# Patient Record
Sex: Female | Born: 1945 | Race: White | Hispanic: No | Smoking: Former smoker
Health system: Southern US, Community
[De-identification: ages and names within clinical notes are randomized; demographics above are authoritative.]

## PROBLEM LIST (undated history)

## (undated) DIAGNOSIS — E785 Hyperlipidemia, unspecified: Secondary | ICD-10-CM

## (undated) DIAGNOSIS — J189 Pneumonia, unspecified organism: Secondary | ICD-10-CM

## (undated) DIAGNOSIS — I10 Essential (primary) hypertension: Secondary | ICD-10-CM

## (undated) DIAGNOSIS — R7303 Prediabetes: Secondary | ICD-10-CM

## (undated) DIAGNOSIS — Z8619 Personal history of other infectious and parasitic diseases: Secondary | ICD-10-CM

## (undated) DIAGNOSIS — M810 Age-related osteoporosis without current pathological fracture: Secondary | ICD-10-CM

## (undated) DIAGNOSIS — E559 Vitamin D deficiency, unspecified: Secondary | ICD-10-CM

## (undated) DIAGNOSIS — C801 Malignant (primary) neoplasm, unspecified: Secondary | ICD-10-CM

## (undated) DIAGNOSIS — Z780 Asymptomatic menopausal state: Secondary | ICD-10-CM

## (undated) HISTORY — DX: Asymptomatic menopausal state: Z78.0

## (undated) HISTORY — DX: Hyperlipidemia, unspecified: E78.5

## (undated) HISTORY — DX: Personal history of other infectious and parasitic diseases: Z86.19

## (undated) HISTORY — DX: Age-related osteoporosis without current pathological fracture: M81.0

## (undated) HISTORY — DX: Vitamin D deficiency, unspecified: E55.9

## (undated) HISTORY — DX: Prediabetes: R73.03

---

## 2009-10-28 LAB — BASIC METABOLIC PANEL: Creatinine: 0.8 mg/dL (ref <=1.1)

## 2009-10-28 LAB — LIPID PANEL
LDL Cholesterol: 124 mg/dL
Triglycerides: 220 mg/dL — AB (ref 40–160)

## 2011-02-23 LAB — CBC AND DIFFERENTIAL
Platelets: 136 10*3/uL — AB (ref 150–399)
WBC: 10.5 10^3/mL

## 2011-10-15 ENCOUNTER — Emergency Department (INDEPENDENT_AMBULATORY_CARE_PROVIDER_SITE_OTHER)
Admission: EM | Admit: 2011-10-15 | Discharge: 2011-10-15 | Disposition: A | Discharge status: Home / Self Care | Payer: Medicare Other | Source: Home / Self Care

## 2011-10-15 ENCOUNTER — Encounter: Payer: Self-pay | Admitting: *Deleted

## 2011-10-15 DIAGNOSIS — N390 Urinary tract infection, site not specified: Secondary | ICD-10-CM

## 2011-10-15 DIAGNOSIS — R1900 Intra-abdominal and pelvic swelling, mass and lump, unspecified site: Secondary | ICD-10-CM

## 2011-10-15 HISTORY — DX: Essential (primary) hypertension: I10

## 2011-10-15 LAB — POCT URINALYSIS DIP (MANUAL ENTRY)
Glucose, UA: NEGATIVE
Nitrite, UA: POSITIVE
Urobilinogen, UA: 0.2 (ref 0–1)

## 2011-10-15 MED ORDER — CEPHALEXIN 500 MG PO CAPS: 500.0000 mg | ORAL_CAPSULE | Freq: Three times a day (TID) | ORAL | Status: AC

## 2011-10-15 NOTE — ED Notes (Signed)
Pt c/o dysuria, frequency and urgency x 1 wk. Denies fever. No OTC meds.

## 2011-10-15 NOTE — ED Provider Notes (Signed)
History     CSN: [id number]  Arrival date & time 10/15/11  4098   First MD Initiated Contact with Patient 10/15/11 [phone]      Chief Complaint  Patient presents with  . Dysuria   HPI DYSURIA Onset:  1 week Description: dysuria, increased urinary frequency Modifying factors: none  Symptoms Urgency:  mild Frequency: mild  Hesitancy:  no Hematuria:  no Flank Pain:  no Fever: no Nausea/Vomiting:  no Missed LMP: no STD exposure: no Discharge: no Irritants: no Rash: no Abdominal Pain: none    Red Flags   More than 3 UTI's last 12 months:  no PMH of  Diabetes or Immunosuppression:  no Renal Disease/Calculi: no Urinary Tract Abnormality:  no Instrumentation or Trauma: no    Past Medical History  Diagnosis Date  . Hypertension     History reviewed. No pertinent past surgical history.  History reviewed. No pertinent family history.  History  Substance Use Topics  . Smoking status: Current Everyday Smoker -- 0.5 packs/day  . Smokeless tobacco: Not on file  . Alcohol Use: No    OB History    Grav Para Term Preterm Abortions TAB SAB Ect Mult Living                  Review of Systems  All other systems reviewed and are negative.    Allergies  Aspirin  Home Medications   Current Outpatient Rx  Name Route Sig Dispense Refill  . LISINOPRIL 10 MG PO TABS Oral Take 10 mg by mouth daily.      BP 120/82  Pulse 96  Temp 98 F (36.7 C) (Oral)  Resp 16  Ht 5\' 9"  (1.753 m)  Wt 153 lb 8 oz (69.627 kg)  BMI 22.67 kg/m2  SpO2 100%  Physical Exam  Constitutional: She appears well-developed and well-nourished.  HENT:  Head: Normocephalic and atraumatic.  Eyes: Conjunctivae are normal. Pupils are equal, round, and reactive to light.  Neck: Normal range of motion. Neck supple.  Cardiovascular: Normal rate and regular rhythm.   Pulmonary/Chest: Effort normal and breath sounds normal.  Abdominal: Soft.       ? Pulsatile abd mass in mid abdomen  Mild  suprapubic tenderness    Musculoskeletal: Normal range of motion.  Neurological: She is alert.  Skin: Skin is warm.    ED Course  Procedures (including critical care time)   Labs Reviewed  POCT URINALYSIS DIP (MANUAL ENTRY)  URINE CULTURE   No results found.   1. UTI (lower urinary tract infection)   2. Pulsatile abdominal mass       MDM  1-  Will treat with keflex x 7 days. Will culture urine. Discussed infectious red flags for reevaluation.   2- Higher concern for this being AAA given age, smoking status, and baseline HTN.  Currently asymptomatic.  Will obtain abdominal ultrasound to check for this.  No signs/symptoms of vascular compromise.  Follow up pending imaging.          Doree Albee, MD 10/15/11 1048

## 2011-10-15 NOTE — ED Notes (Signed)
Linda with [facility] sch'ed appt for complete abd u/s 10/16/11 @ 0930 at [facility]. Pt notified of the appt.

## 2011-10-16 ENCOUNTER — Ambulatory Visit (HOSPITAL_BASED_OUTPATIENT_CLINIC_OR_DEPARTMENT_OTHER)
Admit: 2011-10-16 | Discharge: 2011-10-16 | Disposition: A | Discharge status: Home / Self Care | Payer: Medicare Other | Attending: Family Medicine | Admitting: Family Medicine

## 2011-10-16 DIAGNOSIS — R10819 Abdominal tenderness, unspecified site: Secondary | ICD-10-CM | POA: Diagnosis not present

## 2011-10-16 DIAGNOSIS — I10 Essential (primary) hypertension: Secondary | ICD-10-CM | POA: Insufficient documentation

## 2011-10-16 DIAGNOSIS — Z87891 Personal history of nicotine dependence: Secondary | ICD-10-CM | POA: Diagnosis not present

## 2011-10-17 LAB — URINE CULTURE: Colony Count: >=100000

## 2011-10-18 ENCOUNTER — Telehealth: Payer: Self-pay | Admitting: Emergency Medicine

## 2011-10-18 NOTE — ED Provider Notes (Signed)
Agree with exam, assessment, and plan.   Lattie Haw, MD 10/18/11 1430

## 2011-12-11 ENCOUNTER — Encounter: Payer: Self-pay | Admitting: Family Medicine

## 2011-12-11 ENCOUNTER — Ambulatory Visit (INDEPENDENT_AMBULATORY_CARE_PROVIDER_SITE_OTHER): Payer: Medicare Other | Admitting: Family Medicine

## 2011-12-11 VITALS — BP 124/84 | HR 80 | Ht 68.5 in | Wt 155.0 lb

## 2011-12-11 DIAGNOSIS — Z720 Tobacco use: Secondary | ICD-10-CM

## 2011-12-11 DIAGNOSIS — I10 Essential (primary) hypertension: Secondary | ICD-10-CM | POA: Diagnosis not present

## 2011-12-11 DIAGNOSIS — F17200 Nicotine dependence, unspecified, uncomplicated: Secondary | ICD-10-CM

## 2011-12-11 MED ORDER — LISINOPRIL 10 MG PO TABS: 10.0000 mg | ORAL_TABLET | Freq: Every day | ORAL | Status: DC

## 2011-12-11 NOTE — Progress Notes (Signed)
CC: Jennifer [name] is a 66 y.o. female is here for Establish Care and Hypertension   Subjective: HPI:  Pleasant 66 year old recently moved here from [facility], it establish care with request for lisinopril refill. She's been on lisinopril for 3 years which great control of her blood pressure per her report. She denies history of kidney insufficiency, kidney disease, nor electrolyte abnormalities. She denies motor or sensory disturbances, headache, chest pain, shortness of breath, orthopnea, peripheral edema.  She's been a cigarette smoker for over 40 years has been able to quit multiple times the longest period was 2 years. She's down to 8-10 cigarettes a day and would like to quit. It sounds like her husband's health (pacemaker, coronary fibrosis) causes her anxiety and cigarettes help curb her anxiousness.  Patient reports cholesterol panel June 2013 without abnormalities, colonoscopy 2005 without abnormalities, mammogram 2006 without abnormalities, Pap smear 2006 without abnormalities. Current smoker 10 cigarettes a day for approximately 40 years.   Review Of Systems Outlined In HPI  Past Medical History  Diagnosis Date  . Hypertension      History reviewed. No pertinent family history.   History  Substance Use Topics  . Smoking status: Current Every Day Smoker -- 0.5 packs/day  . Smokeless tobacco: Not on file  . Alcohol Use: No     Objective: Filed Vitals:   12/11/11 0938  BP: 124/84  Pulse: 80    General: Alert and Oriented, No Acute Distress HEENT: Pupils equal, round, reactive to light. Conjunctivae clear.  External ears unremarkable, canals clear with intact TMs with appropriate landmarks.  Middle ear appears open without effusion. Pink inferior turbinates.  Moist mucous membranes, pharynx without inflammation nor lesions.  Neck supple without palpable lymphadenopathy nor abnormal masses. Lungs: Clear to auscultation bilaterally, no wheezing/ronchi/rales.  Comfortable work  of breathing. Good air movement. Cardiac: Regular rate and rhythm. Normal S1/S2.  No murmurs, rubs, nor gallops.   Extremities: No peripheral edema.  Strong peripheral pulses.  Mental Status: No depression, anxiety, nor agitation.   Assessment & Plan: Jennifer [name] was seen today for establish care and hypertension.  Diagnoses and associated orders for this visit:  Tobacco use  Essential hypertension - lisinopril (PRINIVIL,ZESTRIL) 10 MG tablet; Take 1 tablet (10 mg total) by mouth daily.    Smoking cessation counseling for 5 mins.  Applauded patient's desire for smoking cessation.  Reviewed  options for smoking cessation aid w/ patient including gum, patch, oral medications.  Patient desires trial of patches.  Potential side effects reviewed.  Starting and continuing instructions reviewed.  Counseled patient on aspects of psychological and physical addiction to smoking. She'll start thinking a quit date to start using the patches.  I like to check her renal function and potassium level but will wait and have that done at an upcoming complete physical exam which have asked her to return in approximately one month, anticipate we'll need fasting labs at that visit.   Return in about 1 month (around 01/11/2012) for cpe.

## 2011-12-18 ENCOUNTER — Ambulatory Visit (INDEPENDENT_AMBULATORY_CARE_PROVIDER_SITE_OTHER): Payer: Medicare Other | Admitting: Family Medicine

## 2011-12-18 ENCOUNTER — Encounter: Payer: Self-pay | Admitting: Family Medicine

## 2011-12-18 VITALS — BP 123/86 | HR 110 | Temp 98.5°F | Ht 68.5 in | Wt 155.0 lb

## 2011-12-18 DIAGNOSIS — R05 Cough: Secondary | ICD-10-CM

## 2011-12-18 DIAGNOSIS — J029 Acute pharyngitis, unspecified: Secondary | ICD-10-CM

## 2011-12-18 DIAGNOSIS — R059 Cough, unspecified: Secondary | ICD-10-CM | POA: Diagnosis not present

## 2011-12-18 MED ORDER — DOXYCYCLINE HYCLATE 100 MG PO TABS: ORAL_TABLET | ORAL | Status: AC

## 2011-12-18 NOTE — Progress Notes (Signed)
CC: Jennifer [name] is a 66 y.o. female is here for Sore Throat   Subjective: HPI: Patient presents complaining of a cough and sore throat that began on Thursday of last week, symptoms at peak over the weekend and a getting no better nor worse. She's got mild improvement from the cough with an over-the-counter cough medicine of which the name escapes her at this time. The cough is described as productive, she's unsure of any color to the sputum. She describes it as more productive than her typical cough that she associates with her years of smoking. Cough is worsened by lying down flat. She has sensation of something dripping down the back of her throat that causes a tickle. It hurts to swallow, moderate severity, is not influenced by movement of the neck. She denies fevers, chills, shortness of breath, night sweats, confusion, headaches, motor sensory disturbances, ear pain, hearing loss, nausea, vomiting, nor an inability to swallow. If anything she thinks that her appetite has been increased since he was started.   Review Of Systems Outlined In HPI  Past Medical History  Diagnosis Date  . Hypertension      No family history on file.   History  Substance Use Topics  . Smoking status: Current Every Day Smoker -- 0.5 packs/day  . Smokeless tobacco: Not on file  . Alcohol Use: No     Objective: Filed Vitals:   12/18/11 1116  BP: 123/86  Pulse: 110  Temp: 98.5 F (36.9 C)    General: Alert and Oriented, No Acute Distress HEENT: Pupils equal, round, reactive to light. Conjunctivae clear.  External ears unremarkable, canals clear with intact TMs with appropriate landmarks.  Middle ear appears open without effusion. Pink inferior turbinates.  Moist mucous membranes, pharynx  with moderate inflammation/erythema and without lesions and with unremarkable appearing tonsils.  Neck supple without palpable lymphadenopathy nor abnormal masses. Lungs:  trace central rhonchi, no rales no wheezing,  no signs of consolidation  Comfortable work of breathing. Good air movement. Cardiac: Regular rate and rhythm. Normal S1/S2.  No murmurs, rubs, nor gallops.   Mental Status: No depression, anxiety, nor agitation. Skin: Warm and dry.  Assessment & Plan: Raynette was seen today for sore throat.  Diagnoses and associated orders for this visit:  Cough - doxycycline (VIBRA-TABS) 100 MG tablet; One by mouth twice a day for ten days.  Pharyngitis - POCT rapid strep A    Discussed the patient that her symptoms are most likely secondary to a viral pharyngitis/upper respiratory illness. Since she is a long-standing smoker asked her to series to consider starting antibiotic if her symptoms deteriorate or if she has persistent symptoms up until Thursday, which would be one week of illness. Also asked her to call me if she notices any wheezing or worsening shortness of breath as this may indicate a need to start steroids or albuterol. She was given a written prescription to fill if symptoms deteriorate or persist to Thursday.Signs and symptoms requring emergent/urgent reevaluation were discussed with the patient. Tylenol for pain, salt water gargles.  Return if symptoms worsen or fail to improve.

## 2011-12-19 ENCOUNTER — Telehealth: Payer: Self-pay | Admitting: *Deleted

## 2011-12-19 MED ORDER — PREDNISONE 20 MG PO TABS: ORAL_TABLET | ORAL | Status: AC

## 2011-12-19 NOTE — Telephone Encounter (Signed)
The doxy was more for the cough, since she can't take non-steroidal anti-inflammatories due to the asprin allergy she could consider taking a moderate dose of oral prednisone.  I will send this in to her pharmacy should she chose to start this along with the doxycyline.  I believe the soreness of her throat is due to a virus, but the prednisone would help with the pain after 24 hours of starting it.

## 2011-12-19 NOTE — Telephone Encounter (Signed)
Pt calls and complains of really bad sore throat still this morning and can not hardly swallow. Given Doxy and wanted to know if this would work for her sore throat as this is worse than her cold. No SOB or wheezing

## 2011-12-19 NOTE — Telephone Encounter (Signed)
Pt.notified

## 2011-12-20 ENCOUNTER — Encounter: Payer: Self-pay | Admitting: Family Medicine

## 2011-12-20 ENCOUNTER — Ambulatory Visit (INDEPENDENT_AMBULATORY_CARE_PROVIDER_SITE_OTHER): Payer: Medicare Other | Admitting: Family Medicine

## 2011-12-20 VITALS — BP 133/86 | HR 111 | Temp 98.5°F | Wt 152.0 lb

## 2011-12-20 DIAGNOSIS — J358 Other chronic diseases of tonsils and adenoids: Secondary | ICD-10-CM | POA: Diagnosis not present

## 2011-12-20 DIAGNOSIS — J36 Peritonsillar abscess: Secondary | ICD-10-CM

## 2011-12-20 MED ORDER — CEFTRIAXONE SODIUM 250 MG IJ SOLR
250.0000 mg | Freq: Once | INTRAMUSCULAR | Status: AC
  Administered 2011-12-20: 250 mg via INTRAMUSCULAR

## 2011-12-20 NOTE — Progress Notes (Signed)
CC: Jennifer [name] is a 66 y.o. female is here for Sore Throat   Subjective: HPI:  Patient presents for worsening left-sided throat discomfort after being seen yesterday for viral pharyngitis and bronchitis in chronic smoker. She was started on doxycycline and has taken 2 days worth, yesterday she took one dose of 40 mg of prednisone due to worsening throat discomfort, she is unable take nonsteroidal anti-inflammatories to an allergy. She tells me last night she broke out in sweats, she's felt more fatigued than normal, and something that she cannot put her finger on tells her something is wrong. She describes a long history of "tonsillitis episodes "it feels that the pain during this episode is far worse than anything she said in the past. The pain is burning and stabbing and radiates down the left side of her neck. The pain is not influenced by movement of her neck, only talking coughing and swallowing. She does not feel that her voice has changed appreciably. She feels her cough has improved, shortness of breath is improved. She denies nausea, vomiting, abdominal pain, posterior neck pain, confusion, chest pain, orthopnea.   Review Of Systems Outlined In HPI  Past Medical History  Diagnosis Date  . Hypertension      No family history on file.   History  Substance Use Topics  . Smoking status: Current Every Day Smoker -- 0.5 packs/day  . Smokeless tobacco: Not on file  . Alcohol Use: No     Objective: Filed Vitals:   12/20/11 1116  BP: 133/86  Pulse: 111  Temp: 98.5 F (36.9 C)    General: Alert and Oriented, No Acute Distress HEENT: Pupils equal, round, reactive to light. Conjunctivae clear.  External ears unremarkable, canals clear with intact TMs with appropriate landmarks.  Middle ear appears open without effusion. Pink inferior turbinates.  Moist mucous membranes, posterior pharynx is without lesions however uvula is deviated to the right and just posterior to the left tonsil  there is marked erythema and and swelling approaching the midline.  Neck is tender with mild lymphadenopathy on the anterior cervical chain of the left side  Lungs: Clear to auscultation bilaterally, no wheezing/ronchi/rales.  Comfortable work of breathing. Good air movement. Cardiac: Regular rate and rhythm. Normal S1/S2.  No murmurs, rubs, nor gallops.   Skin: Warm and dry.  Assessment & Plan: Jennifer [name] was seen today for sore throat.  Diagnoses and associated orders for this visit:  Peritonsillar cellulitis - Ambulatory referral to ENT - cefTRIAXone (ROCEPHIN) injection 250 mg; Inject 250 mg into the muscle once.  Peritonsillar abscess    Discussed my concern that she may have peritonsillar abscess, were able to get her in a local ENT clinic at 2:00 this afternoon, I have given her a shot of Rocephin and asked her to not take her prednisone today until advice is given from ENT. Discussed the need to go to emergency room for any acute deterioration from now and the appointment. I will call her tomorrow to check on her status and to see what the outcome was from today's ENT visit. Please note that the diagnosis of peritonsillar cellulitis was not initiating reason for a urgent consult, rather as per the suspicion of an abscess.  Return in about 1 week (around 12/27/2011).

## 2011-12-26 ENCOUNTER — Encounter: Payer: Self-pay | Admitting: Family Medicine

## 2012-12-06 ENCOUNTER — Other Ambulatory Visit: Payer: Self-pay | Admitting: Family Medicine

## 2013-05-27 ENCOUNTER — Encounter: Payer: Self-pay | Admitting: Emergency Medicine

## 2013-05-27 ENCOUNTER — Emergency Department (INDEPENDENT_AMBULATORY_CARE_PROVIDER_SITE_OTHER)
Admission: EM | Admit: 2013-05-27 | Discharge: 2013-05-27 | Disposition: A | Discharge status: Home / Self Care | Payer: Medicare Other | Source: Home / Self Care | Attending: Family Medicine | Admitting: Family Medicine

## 2013-05-27 DIAGNOSIS — N3000 Acute cystitis without hematuria: Secondary | ICD-10-CM | POA: Diagnosis not present

## 2013-05-27 LAB — POCT URINALYSIS DIP (MANUAL ENTRY)
Bilirubin, UA: NEGATIVE
GLUCOSE UA: NEGATIVE
Ketones, POC UA: NEGATIVE
NITRITE UA: NEGATIVE
SPEC GRAV UA: 1.025 (ref 1.005–1.03)
UROBILINOGEN UA: 0.2 (ref 0–1)
pH, UA: 7 (ref 5–8)

## 2013-05-27 MED ORDER — PHENAZOPYRIDINE HCL 200 MG PO TABS: 200.0000 mg | ORAL_TABLET | Freq: Three times a day (TID) | ORAL | Status: DC

## 2013-05-27 MED ORDER — NITROFURANTOIN MONOHYD MACRO 100 MG PO CAPS: 100.0000 mg | ORAL_CAPSULE | Freq: Two times a day (BID) | ORAL | Status: DC

## 2013-05-27 NOTE — ED Provider Notes (Signed)
CSN: [id number]     Arrival date & time 05/27/13  1341 History   First MD Initiated Contact with Patient 05/27/13 1404     Chief Complaint  Patient presents with  . Urinary Tract Infection      HPI Comments: Patient developed mild dysuria yesterday which became worse today.  She developed frequency today and noticed a small amount of hematuria.  No fevers, chills, and sweats.  She feels well otherwise.  Patient is a 68 y.o. female presenting with dysuria. The history is provided by the patient.  Dysuria Pain quality:  Burning Pain severity:  Mild Onset quality:  Sudden Duration:  1 day Timing:  Constant Progression:  Worsening Chronicity:  New Recent urinary tract infections: no   Relieved by:  None tried Worsened by:  Nothing tried Ineffective treatments:  Cranberry juice Urinary symptoms: frequent urination, hematuria and hesitancy   Urinary symptoms: no discolored urine, no foul-smelling urine and no bladder incontinence   Associated symptoms: no abdominal pain, no fever, no flank pain, no genital lesions, no nausea, no vaginal discharge and no vomiting   Risk factors: no recurrent urinary tract infections     Past Medical History  Diagnosis Date  . Hypertension    History reviewed. No pertinent past surgical history. History reviewed. No pertinent family history. History  Substance Use Topics  . Smoking status: Current Every Day Smoker -- 0.50 packs/day    Types: Cigarettes  . Smokeless tobacco: Never Used  . Alcohol Use: No   OB History   Grav Para Term Preterm Abortions TAB SAB Ect Mult Living                 Review of Systems  Constitutional: Negative for fever.  Gastrointestinal: Negative for nausea, vomiting and abdominal pain.  Genitourinary: Positive for dysuria. Negative for flank pain and vaginal discharge.  All other systems reviewed and are negative.    Allergies  Aspirin  Home Medications   Current Outpatient Rx  Name  Route  Sig  Dispense   Refill  . lisinopril (PRINIVIL,ZESTRIL) 10 MG tablet      TAKE 1 TABLET (10 MG TOTAL) BY MOUTH DAILY.   90 tablet   2   . nitrofurantoin, macrocrystal-monohydrate, (MACROBID) 100 MG capsule   Oral   Take 1 capsule (100 mg total) by mouth 2 (two) times daily.   14 capsule   0   . phenazopyridine (PYRIDIUM) 200 MG tablet   Oral   Take 1 tablet (200 mg total) by mouth 3 (three) times daily. Take with food.   6 tablet   0    BP 126/87  Pulse 110  Temp(Src) 98.1 F (36.7 C) (Oral)  Resp 14  Wt 151 lb (68.493 kg)  SpO2 98% Physical Exam Nursing notes and Vital Signs reviewed. Appearance:  Patient appears healthy, stated age, and in no acute distress Eyes:  Pupils are equal, round, and reactive to light and accomodation.  Extraocular movement is intact.  Conjunctivae are not inflamed  Pharynx:  Normal; moist mucous membranes  Neck:  Supple.  No adenopathy  Lungs:  Clear to auscultation.  Breath sounds are equal.  Heart:  Regular rate and rhythm without murmurs, rubs, or gallops.  Abdomen:   Mild tenderness over bladder without masses or hepatosplenomegaly.  Bowel sounds are present.  No CVA or flank tenderness.  Extremities:  No edema.  No calf tenderness Skin:  No rash present.   ED Course  Procedures  none  Labs Reviewed  URINE CULTURE  POCT URINALYSIS DIP (MANUAL ENTRY):  BLO large; PRO 100mg /dL; LEU large        MDM   1. Acute cystitis     Urine culture pending.   Rx for Macrobid and Pyridium. Increase fluid intake. Followup with Family Doctor if not improved in one week. If symptoms become significantly worse during the night or over the weekend, proceed to the local emergency room.     Kandra Nicolas, MD 05/27/13 1426

## 2013-05-27 NOTE — ED Notes (Signed)
Polyuria and dysuria x yesterday. Denies fever/chills or flank pain. No OTC med used.

## 2013-05-27 NOTE — Discharge Instructions (Signed)
Increase fluid intake.   Urinary Tract Infection Urinary tract infections (UTIs) can develop anywhere along your urinary tract. Your urinary tract is your body's drainage system for removing wastes and extra water. Your urinary tract includes two kidneys, two ureters, a bladder, and a urethra. Your kidneys are a pair of bean-shaped organs. Each kidney is about the size of your fist. They are located below your ribs, one on each side of your spine. CAUSES Infections are caused by microbes, which are microscopic organisms, including fungi, viruses, and bacteria. These organisms are so small that they can only be seen through a microscope. Bacteria are the microbes that most commonly cause UTIs. SYMPTOMS  Symptoms of UTIs may vary by age and gender of the patient and by the location of the infection. Symptoms in young women typically include a frequent and intense urge to urinate and a painful, burning feeling in the bladder or urethra during urination. Older women and men are more likely to be tired, shaky, and weak and have muscle aches and abdominal pain. A fever may mean the infection is in your kidneys. Other symptoms of a kidney infection include pain in your back or sides below the ribs, nausea, and vomiting. DIAGNOSIS To diagnose a UTI, your caregiver will ask you about your symptoms. Your caregiver also will ask to provide a urine sample. The urine sample will be tested for bacteria and white blood cells. White blood cells are made by your body to help fight infection. TREATMENT  Typically, UTIs can be treated with medication. Because most UTIs are caused by a bacterial infection, they usually can be treated with the use of antibiotics. The choice of antibiotic and length of treatment depend on your symptoms and the type of bacteria causing your infection. HOME CARE INSTRUCTIONS  If you were prescribed antibiotics, take them exactly as your caregiver instructs you. Finish the medication even if  you feel better after you have only taken some of the medication.  Drink enough water and fluids to keep your urine clear or pale yellow.  Avoid caffeine, tea, and carbonated beverages. They tend to irritate your bladder.  Empty your bladder often. Avoid holding urine for long periods of time.  Empty your bladder before and after sexual intercourse.  After a bowel movement, women should cleanse from front to back. Use each tissue only once. SEEK MEDICAL CARE IF:   You have back pain.  You develop a fever.  Your symptoms do not begin to resolve within 3 days. SEEK IMMEDIATE MEDICAL CARE IF:   You have severe back pain or lower abdominal pain.  You develop chills.  You have nausea or vomiting.  You have continued burning or discomfort with urination. MAKE SURE YOU:   Understand these instructions.  Will watch your condition.  Will get help right away if you are not doing well or get worse. Document Released: 11/22/2004 Document Revised: 08/14/2011 Document Reviewed: 03/23/2011 [facility] Patient Information 2014 [place].

## 2013-05-28 LAB — URINE CULTURE
COLONY COUNT: NO GROWTH
ORGANISM ID, BACTERIA: NO GROWTH

## 2013-05-30 ENCOUNTER — Telehealth: Payer: Self-pay | Admitting: *Deleted

## 2013-10-01 ENCOUNTER — Other Ambulatory Visit: Payer: Self-pay | Admitting: Family Medicine

## 2015-04-15 ENCOUNTER — Encounter: Payer: Self-pay | Admitting: Emergency Medicine

## 2015-04-15 ENCOUNTER — Emergency Department (INDEPENDENT_AMBULATORY_CARE_PROVIDER_SITE_OTHER)
Admission: EM | Admit: 2015-04-15 | Discharge: 2015-04-15 | Disposition: A | Discharge status: Home / Self Care | Payer: Medicare Other | Source: Home / Self Care | Attending: Family Medicine | Admitting: Family Medicine

## 2015-04-15 DIAGNOSIS — R300 Dysuria: Secondary | ICD-10-CM | POA: Diagnosis not present

## 2015-04-15 DIAGNOSIS — N3090 Cystitis, unspecified without hematuria: Secondary | ICD-10-CM

## 2015-04-15 DIAGNOSIS — J209 Acute bronchitis, unspecified: Secondary | ICD-10-CM

## 2015-04-15 DIAGNOSIS — H6123 Impacted cerumen, bilateral: Secondary | ICD-10-CM

## 2015-04-15 LAB — POCT URINALYSIS DIP (MANUAL ENTRY)
Bilirubin, UA: NEGATIVE
GLUCOSE UA: NEGATIVE
Ketones, POC UA: NEGATIVE
NITRITE UA: NEGATIVE
Protein Ur, POC: NEGATIVE
Spec Grav, UA: 1.01 (ref 1.005–1.03)
UROBILINOGEN UA: 0.2 (ref 0–1)
pH, UA: 7 (ref 5–8)

## 2015-04-15 MED ORDER — LISINOPRIL 10 MG PO TABS: ORAL_TABLET | ORAL | Status: DC

## 2015-04-15 MED ORDER — CEFDINIR 300 MG PO CAPS: 300.0000 mg | ORAL_CAPSULE | Freq: Two times a day (BID) | ORAL | Status: DC

## 2015-04-15 MED ORDER — PHENAZOPYRIDINE HCL 200 MG PO TABS: 200.0000 mg | ORAL_TABLET | Freq: Three times a day (TID) | ORAL | Status: DC

## 2015-04-15 NOTE — Discharge Instructions (Signed)
Take plain guaifenesin ('1200mg'$  extended release tabs such as Mucinex) twice daily, with plenty of water, for cough and congestion. Get adequate rest.   May use Afrin nasal spray (or generic oxymetazoline) twice daily for about 5 days and then discontinue.  Also recommend using saline nasal spray several times daily and saline nasal irrigation (AYR is a common brand).  Try warm salt water gargles for sore throat.  Stop all antihistamines for now, and other non-prescription cough/cold preparations. May take Delsym Cough Suppressant at bedtime for nighttime cough.  Follow-up with family doctor if not improving about 1 week. Follow-up with family doctor for blood pressure management.

## 2015-04-15 NOTE — ED Notes (Signed)
Congestion, cough, ear pressure x 2 weeks Dysuria x 2 days

## 2015-04-15 NOTE — ED Provider Notes (Signed)
CSN: [id number]     Arrival date & time 04/15/15  1033 History   First MD Initiated Contact with Patient 04/15/15 1057     Chief Complaint  Patient presents with  . Nasal Congestion    Congestion, cough, ear pressure x 2 weeks  . Dysuria    Dysuria x 2 days      HPI Comments: Patient reports that her husband died two weeks ago and she has been under increased stress.  Shortly afterwards, she developed typical cold-like symptoms including mild sore throat, sinus congestion, headache, fatigue, and cough.  Her cough has persisted and now her ears feel clogged, worse on the right.  Two days ago she developed dysuria, frequency, and urgency.  No back ache, abdominal pain, hematuria, or fever. Patient requests refill of lisinopril until she can follow-up with her PCP  The history is provided by the patient.    Past Medical History  Diagnosis Date  . Hypertension    History reviewed. No pertinent past surgical history. No family history on file. Social History  Substance Use Topics  . Smoking status: Current Every Day Smoker -- 0.50 packs/day    Types: Cigarettes  . Smokeless tobacco: Never Used  . Alcohol Use: No   OB History    No data available     Review of Systems + sore throat, resolved + cough No pleuritic pain No wheezing + nasal congestion + post-nasal drainage No sinus pain/pressure No itchy/red eyes ? earache No hemoptysis No SOB No fever/chills No nausea No vomiting No abdominal pain No diarrhea + dysuria No skin rash + fatigue No myalgias + headache Used OTC meds without relief  Allergies  Aspirin  Home Medications   Prior to Admission medications   Medication Sig Start Date End Date Taking? Authorizing Provider  cefdinir (OMNICEF) 300 MG capsule Take 1 capsule (300 mg total) by mouth 2 (two) times daily. 04/15/15   Kandra Nicolas, MD  lisinopril (PRINIVIL,ZESTRIL) 10 MG tablet TAKE 1 TABLET (10 MG TOTAL) BY MOUTH DAILY. 04/15/15   Kandra Nicolas,  MD  phenazopyridine (PYRIDIUM) 200 MG tablet Take 1 tablet (200 mg total) by mouth 3 (three) times daily. Take with food. 04/15/15   Kandra Nicolas, MD   Meds Ordered and Administered this Visit  Medications - No data to display  BP 129/84 mmHg  Pulse 83  Temp(Src) 97.6 F (36.4 C) (Oral)  Ht '5\' 9"'$  (1.753 m)  Wt 150 lb (68.04 kg)  BMI 22.14 kg/m2  SpO2 99% No data found.   Physical Exam Nursing notes and Vital Signs reviewed. Appearance:  Patient appears stated age, and in no acute distress Eyes:  Pupils are equal, round, and reactive to light and accomodation.  Extraocular movement is intact.  Conjunctivae are not inflamed  Ears:  Canals are occluded with cerumen bilaterally.  Post lavage, canals are normal.  Tympanic membranes normal.  Nose:  Congested turbinates.  No sinus tenderness.   Pharynx:  Normal Neck:  Supple.  Tender enlarged posterior nodes are palpated bilaterally  Lungs:  Clear to auscultation.  Breath sounds are equal.  Moving air well. Heart:  Regular rate and rhythm without murmurs, rubs, or gallops.  Abdomen:  Nontender without masses or hepatosplenomegaly.  Bowel sounds are present.  No CVA or flank tenderness.  Extremities:  No edema.  Skin:  No rash present.   ED Course  Procedures (including critical care time)  Labs Review Labs Reviewed  POCT URINALYSIS DIP (MANUAL  ENTRY) - Abnormal; Notable for the following:    Blood, UA small (*)    Leukocytes, UA moderate (2+) (*)    All other components within normal limits  URINE CULTURE      MDM   1. Dysuria   2. Cystitis   3. Cerumen impaction, bilateral   4. Acute bronchitis, unspecified organism    Urine culture pending. Begin Omnicef.  Rx for Pyridium. Refill Lisinopril '10mg'$  (#30, no ref) Take plain guaifenesin ('1200mg'$  extended release tabs such as Mucinex) twice daily, with plenty of water, for cough and congestion. Get adequate rest.   May use Afrin nasal spray (or generic oxymetazoline) twice  daily for about 5 days and then discontinue.  Also recommend using saline nasal spray several times daily and saline nasal irrigation (AYR is a common brand).  Try warm salt water gargles for sore throat.  Stop all antihistamines for now, and other non-prescription cough/cold preparations. May take Delsym Cough Suppressant at bedtime for nighttime cough.  Follow-up with family doctor if not improving about 1 week. Follow-up with family doctor for blood pressure management.    Kandra Nicolas, MD 04/15/15 1201

## 2015-04-18 ENCOUNTER — Telehealth: Payer: Self-pay | Admitting: *Deleted

## 2015-04-18 LAB — URINE CULTURE

## 2015-04-18 MED ORDER — NITROFURANTOIN MONOHYD MACRO 100 MG PO CAPS: 100.0000 mg | ORAL_CAPSULE | Freq: Two times a day (BID) | ORAL | Status: DC

## 2016-01-02 ENCOUNTER — Ambulatory Visit: Payer: Medicare Other | Admitting: Osteopathic Medicine

## 2016-04-04 ENCOUNTER — Ambulatory Visit (INDEPENDENT_AMBULATORY_CARE_PROVIDER_SITE_OTHER): Payer: Medicare Other | Admitting: Family Medicine

## 2016-04-04 ENCOUNTER — Encounter: Payer: Self-pay | Admitting: Family Medicine

## 2016-04-04 ENCOUNTER — Ambulatory Visit (HOSPITAL_BASED_OUTPATIENT_CLINIC_OR_DEPARTMENT_OTHER)
Admission: RE | Admit: 2016-04-04 | Discharge: 2016-04-04 | Disposition: A | Discharge status: Home / Self Care | Payer: Medicare Other | Source: Ambulatory Visit | Attending: Family Medicine | Admitting: Family Medicine

## 2016-04-04 VITALS — BP 120/70 | HR 84 | Temp 98.0°F | Ht 68.5 in | Wt 145.6 lb

## 2016-04-04 DIAGNOSIS — M461 Sacroiliitis, not elsewhere classified: Secondary | ICD-10-CM | POA: Insufficient documentation

## 2016-04-04 DIAGNOSIS — I10 Essential (primary) hypertension: Secondary | ICD-10-CM

## 2016-04-04 MED ORDER — LISINOPRIL 10 MG PO TABS: ORAL_TABLET | ORAL | 1 refills | Status: DC

## 2016-04-04 NOTE — Patient Instructions (Addendum)
Stick with Tylenol for pain control.   Use heat 10-15 min every 3-4 hours when awake.  Assume your XR is normal unless you hear anything from out office.  Consider general stretching and starting yoga.  Around 3 times per week, check your blood pressure 4 times per day. Twice in the morning and twice in the evening. The readings should be at least one minute apart. Write down these values and bring them to your next nurse visit/appointment.  When you check your BP, make sure you have been doing something calm/relaxing 5 minutes prior to checking. Both feet should be flat on the floor and you should be sitting. Use your left arm and make sure it is in a relaxed position (on a table), and that the cuff is at the approximate level/height of your heart.

## 2016-04-04 NOTE — Progress Notes (Signed)
Chief Complaint  Patient presents with  . Establish Care    pt wanting to discuss elevated BP   . Back Pain    lower-pt states (B) legs are weak       New Patient Visit SUBJECTIVE: HPI: Jennifer [name] is an 71 y.o.female who is being seen for establishing care.  The patient was previously seen with Dr. Ileene Rubens of [facility]  Patient reports history hypertension. She has not monitor home blood pressures. She is compliant with home medication, lisinopril 10 mg daily. She is not having any adverse effects of the medication. She has not been exercising lately as her husband and pass away 2017. He is very ill leading up to his death and she was spending much time taking care of him. Her diet is healthy in general.  The patient also complains of lower back pain. This started in November, possibly related to her moving. It did improve but after Christmas started to worsen again. It is equal on both sides and described as a sharp pain. It does not radiate anywhere. She has associated and intermittent tingling in her knees. She denies any weakness, loss of bowel/bladder function, weight loss, or skin changes. Has not tried anything at home so far.   Allergies  Allergen Reactions  . Aspirin Hives    Past Medical History:  Diagnosis Date  . History of chicken pox   . Hypertension    History reviewed. No pertinent surgical history. Social History   Social History  . Marital status: Widowed   Social History Main Topics  . Smoking status: Current Every Day Smoker    Packs/day: 0.50    Types: Cigarettes  . Smokeless tobacco: Never Used  . Alcohol use No  . Drug use: No   History reviewed. No pertinent family history.   Current Outpatient Prescriptions:  .  lisinopril (PRINIVIL,ZESTRIL) 10 MG tablet, TAKE 1 TABLET (10 MG TOTAL) BY MOUTH DAILY., Disp: 90 tablet, Rfl: 1  No LMP recorded. Patient is postmenopausal.  ROS MSK: +LBP  Neuro: Denies Weakness    OBJECTIVE: BP 120/70 (BP  Location: Left Arm, Patient Position: Sitting, Cuff Size: Normal)   Pulse 84   Temp 98 F (36.7 C) (Oral)   Ht 5' 8.5" (1.74 m)   Wt 145 lb 9.6 oz (66 kg)   SpO2 99%   BMI 21.82 kg/m   Constitutional: -  VS reviewed -  Well developed, well nourished, appears stated age -  No apparent distress  Psychiatric: -  Oriented to person, place, and time -  Memory intact -  Affect and mood normal -  Fluent conversation, good eye contact -  Judgment and insight age appropriate  ENMT: -  Oral mucosa without lesions, tongue and uvula midline    Tonsils not enlarged, no erythema, no exudate, trachea midline    Pharynx moist, no lesions, no erythema  Neck: -  No gross swelling, no palpable masses -  Thyroid midline, not enlarged, mobile, no palpable masses  Cardiovascular: -  RRR, no murmurs -  No LE edema  Respiratory: -  Normal respiratory effort, no accessory muscle use, no retraction -  Breath sounds equal, no wheezes, no ronchi, no crackles  Neurological:  -  CN II - XII grossly intact -  4/4 patellar reflex, 2/4 calcaneal reflex, 3/4 biceps reflex, no clonus -  Sensation in LE's intact to light touch, equal bilaterally  Musculoskeletal: -  No clubbing, no cyanosis -  Gait normal -  +TTP  over the SI joint b/l -  5/5 strength throughout LE's b/l  Skin: -  No significant lesion on inspection -  Warm and dry to palpation   ASSESSMENT/PLAN: Essential hypertension - Plan: lisinopril (PRINIVIL,ZESTRIL) 10 MG tablet  Sacroiliitis (HCC) - Plan: DG Sacrum/Coccyx  Patient instructed to sign release of records form from her previous PCP. Refill of lisinopril, I would like her to keep her blood pressure at home. We can discuss this at her next appointment as well. She may be able to come off of her lisinopril. Recommended Tylenol for her pain. We'll get an x-ray to make sure severe arthritis is not involved. Also offered injections versus physical therapy. She would like to trial conservative  measures first. Patient should return in 6 weeks to recheck low pain. The patient voiced understanding and agreement to the plan.   Stickney, DO 04/04/16  2:14 PM

## 2016-10-16 ENCOUNTER — Telehealth: Payer: Self-pay | Admitting: Family Medicine

## 2016-10-16 NOTE — Telephone Encounter (Signed)
Call pt to schedule AWV. Left vm for pt to call office to schedule appt. Pt is eligible for initial AWV.

## 2016-12-24 ENCOUNTER — Telehealth: Payer: Self-pay | Admitting: Family Medicine

## 2016-12-24 NOTE — Telephone Encounter (Signed)
Spoke with pt. Jennifer [name] stated that she is currently out of the country, and will give office a call back when she returns.

## 2017-12-08 DIAGNOSIS — N390 Urinary tract infection, site not specified: Secondary | ICD-10-CM | POA: Diagnosis not present

## 2017-12-08 DIAGNOSIS — I10 Essential (primary) hypertension: Secondary | ICD-10-CM | POA: Diagnosis not present

## 2018-05-12 DIAGNOSIS — Z760 Encounter for issue of repeat prescription: Secondary | ICD-10-CM | POA: Diagnosis not present

## 2018-05-12 DIAGNOSIS — I10 Essential (primary) hypertension: Secondary | ICD-10-CM | POA: Diagnosis not present

## 2018-05-12 DIAGNOSIS — J0140 Acute pansinusitis, unspecified: Secondary | ICD-10-CM | POA: Diagnosis not present

## 2018-09-24 ENCOUNTER — Other Ambulatory Visit: Payer: Self-pay

## 2018-10-10 DIAGNOSIS — Z131 Encounter for screening for diabetes mellitus: Secondary | ICD-10-CM | POA: Diagnosis not present

## 2018-10-10 DIAGNOSIS — R5383 Other fatigue: Secondary | ICD-10-CM | POA: Diagnosis not present

## 2018-10-10 DIAGNOSIS — F17210 Nicotine dependence, cigarettes, uncomplicated: Secondary | ICD-10-CM | POA: Diagnosis not present

## 2018-10-10 DIAGNOSIS — Z79899 Other long term (current) drug therapy: Secondary | ICD-10-CM | POA: Diagnosis not present

## 2018-10-10 DIAGNOSIS — R7303 Prediabetes: Secondary | ICD-10-CM | POA: Diagnosis not present

## 2018-10-10 DIAGNOSIS — I10 Essential (primary) hypertension: Secondary | ICD-10-CM | POA: Diagnosis not present

## 2018-10-10 DIAGNOSIS — Z1159 Encounter for screening for other viral diseases: Secondary | ICD-10-CM | POA: Diagnosis not present

## 2018-10-10 DIAGNOSIS — E559 Vitamin D deficiency, unspecified: Secondary | ICD-10-CM | POA: Diagnosis not present

## 2018-10-10 DIAGNOSIS — E538 Deficiency of other specified B group vitamins: Secondary | ICD-10-CM | POA: Diagnosis not present

## 2018-10-10 DIAGNOSIS — R0602 Shortness of breath: Secondary | ICD-10-CM | POA: Diagnosis not present

## 2018-10-10 DIAGNOSIS — Z0000 Encounter for general adult medical examination without abnormal findings: Secondary | ICD-10-CM | POA: Diagnosis not present

## 2018-10-10 DIAGNOSIS — E7800 Pure hypercholesterolemia, unspecified: Secondary | ICD-10-CM | POA: Diagnosis not present

## 2018-10-14 ENCOUNTER — Other Ambulatory Visit: Payer: Self-pay | Admitting: Endocrinology

## 2018-10-14 DIAGNOSIS — Z1231 Encounter for screening mammogram for malignant neoplasm of breast: Secondary | ICD-10-CM

## 2018-10-16 ENCOUNTER — Other Ambulatory Visit: Payer: Self-pay | Admitting: Endocrinology

## 2018-10-16 DIAGNOSIS — Z780 Asymptomatic menopausal state: Secondary | ICD-10-CM | POA: Diagnosis not present

## 2018-10-16 DIAGNOSIS — Z1231 Encounter for screening mammogram for malignant neoplasm of breast: Secondary | ICD-10-CM

## 2018-10-17 DIAGNOSIS — I10 Essential (primary) hypertension: Secondary | ICD-10-CM | POA: Diagnosis not present

## 2018-10-17 DIAGNOSIS — E7800 Pure hypercholesterolemia, unspecified: Secondary | ICD-10-CM | POA: Diagnosis not present

## 2018-10-17 DIAGNOSIS — R0602 Shortness of breath: Secondary | ICD-10-CM | POA: Diagnosis not present

## 2018-10-17 DIAGNOSIS — R7303 Prediabetes: Secondary | ICD-10-CM | POA: Diagnosis not present

## 2018-10-17 DIAGNOSIS — Z1331 Encounter for screening for depression: Secondary | ICD-10-CM | POA: Diagnosis not present

## 2018-10-24 DIAGNOSIS — N390 Urinary tract infection, site not specified: Secondary | ICD-10-CM | POA: Diagnosis not present

## 2018-10-24 DIAGNOSIS — M810 Age-related osteoporosis without current pathological fracture: Secondary | ICD-10-CM | POA: Diagnosis not present

## 2018-10-24 DIAGNOSIS — R918 Other nonspecific abnormal finding of lung field: Secondary | ICD-10-CM | POA: Diagnosis not present

## 2018-10-24 DIAGNOSIS — I10 Essential (primary) hypertension: Secondary | ICD-10-CM | POA: Diagnosis not present

## 2018-11-10 DIAGNOSIS — R911 Solitary pulmonary nodule: Secondary | ICD-10-CM | POA: Diagnosis not present

## 2018-11-12 DIAGNOSIS — E559 Vitamin D deficiency, unspecified: Secondary | ICD-10-CM | POA: Diagnosis not present

## 2018-11-12 DIAGNOSIS — D491 Neoplasm of unspecified behavior of respiratory system: Secondary | ICD-10-CM | POA: Diagnosis not present

## 2018-11-12 DIAGNOSIS — I10 Essential (primary) hypertension: Secondary | ICD-10-CM | POA: Diagnosis not present

## 2018-11-12 DIAGNOSIS — E7800 Pure hypercholesterolemia, unspecified: Secondary | ICD-10-CM | POA: Diagnosis not present

## 2018-11-18 ENCOUNTER — Other Ambulatory Visit: Payer: Self-pay | Admitting: Thoracic Surgery (Cardiothoracic Vascular Surgery)

## 2018-11-18 DIAGNOSIS — R911 Solitary pulmonary nodule: Secondary | ICD-10-CM

## 2018-11-21 ENCOUNTER — Encounter: Payer: Medicare Other | Admitting: Thoracic Surgery (Cardiothoracic Vascular Surgery)

## 2018-11-21 ENCOUNTER — Other Ambulatory Visit (HOSPITAL_COMMUNITY): Payer: Self-pay | Admitting: Endocrinology

## 2018-11-21 ENCOUNTER — Other Ambulatory Visit: Payer: Self-pay | Admitting: Endocrinology

## 2018-11-21 DIAGNOSIS — E7800 Pure hypercholesterolemia, unspecified: Secondary | ICD-10-CM | POA: Diagnosis not present

## 2018-11-21 DIAGNOSIS — R222 Localized swelling, mass and lump, trunk: Secondary | ICD-10-CM | POA: Diagnosis not present

## 2018-11-21 DIAGNOSIS — I10 Essential (primary) hypertension: Secondary | ICD-10-CM | POA: Diagnosis not present

## 2018-11-21 DIAGNOSIS — D491 Neoplasm of unspecified behavior of respiratory system: Secondary | ICD-10-CM | POA: Diagnosis not present

## 2018-11-21 DIAGNOSIS — F17210 Nicotine dependence, cigarettes, uncomplicated: Secondary | ICD-10-CM | POA: Diagnosis not present

## 2018-11-24 ENCOUNTER — Other Ambulatory Visit: Payer: Self-pay | Admitting: *Deleted

## 2018-11-24 DIAGNOSIS — R911 Solitary pulmonary nodule: Secondary | ICD-10-CM

## 2018-11-27 ENCOUNTER — Other Ambulatory Visit (HOSPITAL_COMMUNITY)
Admission: RE | Admit: 2018-11-27 | Discharge: 2018-11-27 | Disposition: A | Discharge status: Home / Self Care | Payer: Medicare Other | Source: Ambulatory Visit | Attending: Thoracic Surgery (Cardiothoracic Vascular Surgery) | Admitting: Thoracic Surgery (Cardiothoracic Vascular Surgery)

## 2018-11-27 DIAGNOSIS — Z20828 Contact with and (suspected) exposure to other viral communicable diseases: Secondary | ICD-10-CM | POA: Insufficient documentation

## 2018-11-28 LAB — NOVEL CORONAVIRUS, NAA (HOSP ORDER, SEND-OUT TO REF LAB; TAT 18-24 HRS): SARS-CoV-2, NAA: NOT DETECTED

## 2018-12-01 ENCOUNTER — Ambulatory Visit: Payer: Medicare Other

## 2018-12-01 ENCOUNTER — Other Ambulatory Visit: Payer: Self-pay

## 2018-12-01 ENCOUNTER — Ambulatory Visit (HOSPITAL_COMMUNITY)
Admission: RE | Admit: 2018-12-01 | Discharge: 2018-12-01 | Disposition: A | Discharge status: Home / Self Care | Payer: Medicare Other | Source: Ambulatory Visit | Attending: Thoracic Surgery (Cardiothoracic Vascular Surgery) | Admitting: Thoracic Surgery (Cardiothoracic Vascular Surgery)

## 2018-12-01 ENCOUNTER — Encounter (HOSPITAL_COMMUNITY)
Admission: RE | Admit: 2018-12-01 | Discharge: 2018-12-01 | Disposition: A | Discharge status: Home / Self Care | Payer: Medicare Other | Source: Ambulatory Visit | Attending: Endocrinology | Admitting: Endocrinology

## 2018-12-01 DIAGNOSIS — I700 Atherosclerosis of aorta: Secondary | ICD-10-CM | POA: Diagnosis not present

## 2018-12-01 DIAGNOSIS — D491 Neoplasm of unspecified behavior of respiratory system: Secondary | ICD-10-CM | POA: Diagnosis not present

## 2018-12-01 DIAGNOSIS — R911 Solitary pulmonary nodule: Secondary | ICD-10-CM | POA: Diagnosis not present

## 2018-12-01 DIAGNOSIS — D3502 Benign neoplasm of left adrenal gland: Secondary | ICD-10-CM | POA: Insufficient documentation

## 2018-12-01 DIAGNOSIS — I10 Essential (primary) hypertension: Secondary | ICD-10-CM | POA: Diagnosis not present

## 2018-12-01 DIAGNOSIS — Z79899 Other long term (current) drug therapy: Secondary | ICD-10-CM | POA: Insufficient documentation

## 2018-12-01 DIAGNOSIS — F17210 Nicotine dependence, cigarettes, uncomplicated: Secondary | ICD-10-CM | POA: Diagnosis not present

## 2018-12-01 LAB — PULMONARY FUNCTION TEST
DL/VA % pred: 83 %
DL/VA: 3.33 ml/min/mmHg/L
DLCO unc % pred: 86 %
DLCO unc: 19.78 ml/min/mmHg
FEF 25-75 Post: 1.22 L/sec
FEF 25-75 Pre: 1.21 L/sec
FEF2575-%Change-Post: 0 %
FEF2575-%Pred-Post: 57 %
FEF2575-%Pred-Pre: 57 %
FEV1-%Change-Post: 0 %
FEV1-%Pred-Post: 82 %
FEV1-%Pred-Pre: 82 %
FEV1-Post: 2.23 L
FEV1-Pre: 2.23 L
FEV1FVC-%Change-Post: -1 %
FEV1FVC-%Pred-Pre: 85 %
FEV6-%Change-Post: 1 %
FEV6-%Pred-Post: 100 %
FEV6-%Pred-Pre: 98 %
FEV6-Post: 3.41 L
FEV6-Pre: 3.35 L
FEV6FVC-%Change-Post: 0 %
FEV6FVC-%Pred-Post: 101 %
FEV6FVC-%Pred-Pre: 101 %
FVC-%Change-Post: 1 %
FVC-%Pred-Post: 98 %
FVC-%Pred-Pre: 97 %
FVC-Post: 3.52 L
FVC-Pre: 3.46 L
Post FEV1/FVC ratio: 63 %
Post FEV6/FVC ratio: 97 %
Pre FEV1/FVC ratio: 64 %
Pre FEV6/FVC Ratio: 97 %
RV % pred: 210 %
RV: 5.21 L
TLC % pred: 150 %
TLC: 8.75 L

## 2018-12-01 LAB — GLUCOSE, CAPILLARY: Glucose-Capillary: 116 mg/dL — ABNORMAL HIGH (ref 70–99)

## 2018-12-01 MED ORDER — FLUDEOXYGLUCOSE F - 18 (FDG) INJECTION
7.2700 | Freq: Once | INTRAVENOUS | Status: AC | PRN
  Administered 2018-12-01: 11:00:00 7.27 via INTRAVENOUS

## 2018-12-01 MED ORDER — ALBUTEROL SULFATE (2.5 MG/3ML) 0.083% IN NEBU
2.5000 mg | INHALATION_SOLUTION | Freq: Once | RESPIRATORY_TRACT | Status: AC
Start: 2018-12-01 — End: 2018-12-01
  Administered 2018-12-01: 2.5 mg via RESPIRATORY_TRACT

## 2018-12-04 ENCOUNTER — Other Ambulatory Visit: Payer: Self-pay

## 2018-12-04 NOTE — Progress Notes (Signed)
[address]       [place] [address]             [phone]                    [name] Medical Record #[id number] Date of Birth: 05/15/1945  Referring: Beverley Fiedler, * Primary Care: Shelda Pal, DO Primary Cardiologist: No primary care provider on file.  Chief Complaint:    Chief Complaint  Patient presents with  . Lung Mass    Surgical consult    History of Present Illness:    Jennifer [name] 73 y.o. female presents for surgical evaluation of a 1.9cm left upper lobe pulmonary nodule.  She was seen by a new PCP, and due to her smoking Hx, and CT chest was ordered which identified this nodule.  She denies any neurologic symptoms, and her weight has been stable.  She has a chronic cough, but does not have any exertional dyspnea.  She continues to smoke, and this has been worst with her anxiety    Smoking Hx: Ppd since close to 78yrs   Zubrod Score: At the time of surgery this patient's most appropriate activity status/level should be described as: [x]     0    Normal activity, no symptoms []     1    Restricted in physical strenuous activity but ambulatory, able to do out light work []     2    Ambulatory and capable of self care, unable to do work activities, up and about               >50 % of waking hours                              []     3    Only limited self care, in bed greater than 50% of waking hours []     4    Completely disabled, no self care, confined to bed or chair []     5    Moribund   Past Medical History:  Diagnosis Date  . History of chicken pox   . Hyperlipidemia   . Hypertension   . Menopause   . Osteoporosis   . Pre-diabetes   . Vitamin D deficiency     No past surgical history on file.  No family history on file.   Social History   Tobacco Use  Smoking Status Current Every Day Smoker  . Packs/day: 0.50  . Types: Cigarettes  Smokeless Tobacco Never Used    Social History    Substance and Sexual Activity  Alcohol Use No     Allergies  Allergen Reactions  . Aspirin Hives  . Latex Rash    Current Outpatient Medications  Medication Sig Dispense Refill  . lisinopril (PRINIVIL,ZESTRIL) 10 MG tablet TAKE 1 TABLET (10 MG TOTAL) BY MOUTH DAILY. (Patient taking differently: 30 mg daily. 30 mg daily) 90 tablet 1  . alendronate (FOSAMAX) 10 MG tablet Take 10 mg by mouth daily.    Marland Kitchen atorvastatin (LIPITOR) 40 MG tablet Take 40 mg by mouth daily.     No current facility-administered medications for this visit.     Review of Systems  Constitutional: Negative.   HENT: Negative.   Eyes: Negative.   Respiratory: Positive for cough and sputum production. Negative for shortness of breath.   Cardiovascular: Negative.  Gastrointestinal: Negative.   Genitourinary: Negative.   Musculoskeletal: Negative.   Skin: Negative.   Neurological: Negative.   Psychiatric/Behavioral: Positive for depression. The patient is nervous/anxious.      PHYSICAL EXAMINATION: BP (!) 188/72 (BP Location: Right Arm)   Pulse 69   Temp 97.9 F (36.6 C) (Skin)   Resp 20   Ht 5\' 9"  (1.753 m)   Wt 155 lb 9.6 oz (70.6 kg)   SpO2 97% Comment: ra  BMI 22.98 kg/m  Physical Exam  Constitutional: She is oriented to person, place, and time. She appears well-developed and well-nourished. No distress.  HENT:  Head: Normocephalic and atraumatic.  Eyes: Conjunctivae and EOM are normal.  Neck: Normal range of motion. Neck supple. No tracheal deviation present.  Cardiovascular: Normal rate, regular rhythm and normal heart sounds.  No murmur heard. Respiratory: Effort normal and breath sounds normal. No respiratory distress. She has no wheezes.  GI: Soft. She exhibits no distension.  Musculoskeletal: Normal range of motion.        General: No edema.  Neurological: She is alert and oriented to person, place, and time.  Skin: Skin is warm and dry. She is not diaphoretic.  Psychiatric: She has  a normal mood and affect.    Diagnostic Studies & Laboratory data:     Recent Radiology Findings:   Nm Pet Image Initial (pi) Skull Base To Thigh  Result Date: 12/01/2018 CLINICAL DATA:  Subsequent treatment strategy for pulmonary nodule. EXAM: NUCLEAR MEDICINE PET SKULL BASE TO THIGH TECHNIQUE: 7.27 mCi F-18 FDG was injected intravenously. Full-ring PET imaging was performed from the skull base to thigh after the radiotracer. CT data was obtained and used for attenuation correction and anatomic localization. Fasting blood glucose: 116 mg/dl COMPARISON:  None available FINDINGS: Mediastinal blood pool activity: SUV max 2.1 Liver activity: SUV max 3.3 NECK: No hypermetabolic lymph nodes in the neck. Incidental CT findings: none CHEST: Within the LEFT upper lobe rounded nodule with spiculated margin measures 1.8 x 1.9 cm and has moderate metabolic activity with SUV max equal 4.2. No additional suspicious pulmonary nodules. No enlarged or hypermetabolic mediastinal lymph nodes. Incidental CT findings: none ABDOMEN/PELVIS: No abnormal hypermetabolic activity within the liver, pancreas, adrenal glands, or spleen. No hypermetabolic lymph nodes in the abdomen or pelvis. Enlargement of the LEFT adrenal gland has low attenuation consistent benign adenoma. Incidental CT findings: Atherosclerotic calcification of the aorta. Uterus and ovaries normal. SKELETON: No focal hypermetabolic activity to suggest skeletal metastasis. Incidental CT findings: none IMPRESSION: 1. Hypermetabolic LEFT upper lobe pulmonary nodule most consistent with primary bronchogenic carcinoma. No hypermetabolic mediastinal lymphadenopathy. No distant metastatic disease. PET-CT staging T1b N0 M0. 2. Benign LEFT adrenal adenoma. 3.  Aortic Atherosclerosis (ICD10-I70.0). Electronically Signed   By: Suzy Bouchard M.D.   On: 12/01/2018 12:28       I have independently reviewed the above radiology studies  and reviewed the findings with the  patient.   Recent Lab Findings: Lab Results  Component Value Date   WBC 10.5 02/23/2011   HGB 15.2 02/23/2011   PLT 136 (A) 02/23/2011   CHOL 209 (A) 10/28/2009   TRIG 220 (A) 10/28/2009   HDL 41 10/28/2009   LDLCALC 124 10/28/2009   CREATININE 0.8 10/28/2009     PFTs: - FVC: 97% - FEV1: 82% -DLCO: 86%  Problem List: 1.9cm LUL pulmonary nodule with SUV of 4.2 Left adrenal adenoma  Assessment / Plan:   73 yo female with a 1.9cm LUL pulmonary nodule  concerning for primary lung cancer.  She continues to smoke, thus has been counseled on cessation.  She would like to proceed with a navigational bronchoscopy, and biopsy, which has been scheduled for Oct 12 or 13.  In that time she will plan to stop smoking.  If this is a primary lung cancer, she would like to proceed with surgical resection over SBRT, and understands the importance of smoking cessation.     I  spent 40 minutes with  the patient face to face and greater then 50% of the time was spent in counseling and coordination of care.    Lajuana Matte 12/05/2018 1:51 PM

## 2018-12-05 ENCOUNTER — Encounter (HOSPITAL_COMMUNITY): Payer: Self-pay | Admitting: *Deleted

## 2018-12-05 ENCOUNTER — Institutional Professional Consult (permissible substitution) (INDEPENDENT_AMBULATORY_CARE_PROVIDER_SITE_OTHER): Payer: Medicare Other | Admitting: Thoracic Surgery (Cardiothoracic Vascular Surgery)

## 2018-12-05 ENCOUNTER — Other Ambulatory Visit: Payer: Self-pay

## 2018-12-05 ENCOUNTER — Encounter: Payer: Self-pay | Admitting: Thoracic Surgery (Cardiothoracic Vascular Surgery)

## 2018-12-05 ENCOUNTER — Other Ambulatory Visit: Payer: Self-pay | Admitting: *Deleted

## 2018-12-05 VITALS — BP 188/72 | HR 69 | Temp 97.9°F | Resp 20 | Ht 69.0 in | Wt 155.6 lb

## 2018-12-05 DIAGNOSIS — R911 Solitary pulmonary nodule: Secondary | ICD-10-CM

## 2018-12-05 NOTE — Progress Notes (Signed)
Spoke with pt for pre-op call. Pt denies cardiac history. States she is pre-diabetic, not sure of what her A1C was, does not check her blood sugar at home.   Pt will get her Covid test done tomorrow, quarantine instructions given and she voiced understanding.

## 2018-12-06 ENCOUNTER — Other Ambulatory Visit (HOSPITAL_COMMUNITY)
Admission: RE | Admit: 2018-12-06 | Discharge: 2018-12-06 | Disposition: A | Discharge status: Home / Self Care | Payer: Medicare Other | Source: Ambulatory Visit | Attending: Thoracic Surgery (Cardiothoracic Vascular Surgery) | Admitting: Thoracic Surgery (Cardiothoracic Vascular Surgery)

## 2018-12-06 DIAGNOSIS — Z01812 Encounter for preprocedural laboratory examination: Secondary | ICD-10-CM | POA: Insufficient documentation

## 2018-12-06 DIAGNOSIS — Z20828 Contact with and (suspected) exposure to other viral communicable diseases: Secondary | ICD-10-CM | POA: Insufficient documentation

## 2018-12-06 DIAGNOSIS — R911 Solitary pulmonary nodule: Secondary | ICD-10-CM

## 2018-12-07 LAB — NOVEL CORONAVIRUS, NAA (HOSP ORDER, SEND-OUT TO REF LAB; TAT 18-24 HRS): SARS-CoV-2, NAA: NOT DETECTED

## 2018-12-08 NOTE — Anesthesia Preprocedure Evaluation (Signed)
Anesthesia Evaluation  Patient identified by MRN, date of birth, ID band Patient awake    Reviewed: Allergy & Precautions, NPO status , Patient's Chart, lab work & pertinent test results  Airway Mallampati: II  TM Distance: >3 FB Neck ROM: Full    Dental  (+) Dental Advisory Given   Pulmonary pneumonia, resolved, former smoker,    Pulmonary exam normal breath sounds clear to auscultation       Cardiovascular hypertension, Pt. on medications Normal cardiovascular exam Rhythm:Regular Rate:Normal     Neuro/Psych negative neurological ROS  negative psych ROS   GI/Hepatic negative GI ROS, Neg liver ROS,   Endo/Other  negative endocrine ROS  Renal/GU negative Renal ROS     Musculoskeletal negative musculoskeletal ROS (+)   Abdominal   Peds  Hematology negative hematology ROS (+)   Anesthesia Other Findings   Reproductive/Obstetrics negative OB ROS                             Anesthesia Physical Anesthesia Plan  ASA: III  Anesthesia Plan: General   Post-op Pain Management:    Induction: Intravenous  PONV Risk Score and Plan: 3 and Ondansetron, Dexamethasone and Treatment may vary due to age or medical condition  Airway Management Planned: Oral ETT  Additional Equipment: None  Intra-op Plan:   Post-operative Plan: Extubation in OR  Informed Consent: I have reviewed the patients History and Physical, chart, labs and discussed the procedure including the risks, benefits and alternatives for the proposed anesthesia with the patient or authorized representative who has indicated his/her understanding and acceptance.     Dental advisory given  Plan Discussed with: CRNA  Anesthesia Plan Comments:         Anesthesia Quick Evaluation

## 2018-12-09 ENCOUNTER — Other Ambulatory Visit: Payer: Self-pay

## 2018-12-09 ENCOUNTER — Encounter (HOSPITAL_COMMUNITY)
Admission: RE | Disposition: A | Discharge status: Home / Self Care | Payer: Self-pay | Source: Home / Self Care | Attending: Thoracic Surgery (Cardiothoracic Vascular Surgery)

## 2018-12-09 ENCOUNTER — Ambulatory Visit (HOSPITAL_COMMUNITY): Payer: Medicare Other | Admitting: Certified Registered"

## 2018-12-09 ENCOUNTER — Ambulatory Visit (HOSPITAL_COMMUNITY): Payer: Medicare Other

## 2018-12-09 ENCOUNTER — Encounter (HOSPITAL_COMMUNITY): Payer: Self-pay | Admitting: Certified Registered"

## 2018-12-09 ENCOUNTER — Ambulatory Visit (HOSPITAL_COMMUNITY)
Admission: RE | Admit: 2018-12-09 | Discharge: 2018-12-09 | Disposition: A | Discharge status: Home / Self Care | Payer: Medicare Other | Attending: Thoracic Surgery (Cardiothoracic Vascular Surgery) | Admitting: Thoracic Surgery (Cardiothoracic Vascular Surgery)

## 2018-12-09 DIAGNOSIS — Z01818 Encounter for other preprocedural examination: Secondary | ICD-10-CM

## 2018-12-09 DIAGNOSIS — F17210 Nicotine dependence, cigarettes, uncomplicated: Secondary | ICD-10-CM | POA: Insufficient documentation

## 2018-12-09 DIAGNOSIS — Z79899 Other long term (current) drug therapy: Secondary | ICD-10-CM | POA: Insufficient documentation

## 2018-12-09 DIAGNOSIS — R918 Other nonspecific abnormal finding of lung field: Secondary | ICD-10-CM | POA: Diagnosis not present

## 2018-12-09 DIAGNOSIS — E785 Hyperlipidemia, unspecified: Secondary | ICD-10-CM | POA: Insufficient documentation

## 2018-12-09 DIAGNOSIS — R7303 Prediabetes: Secondary | ICD-10-CM | POA: Insufficient documentation

## 2018-12-09 DIAGNOSIS — M810 Age-related osteoporosis without current pathological fracture: Secondary | ICD-10-CM | POA: Diagnosis not present

## 2018-12-09 DIAGNOSIS — F419 Anxiety disorder, unspecified: Secondary | ICD-10-CM | POA: Diagnosis not present

## 2018-12-09 DIAGNOSIS — R911 Solitary pulmonary nodule: Secondary | ICD-10-CM | POA: Diagnosis not present

## 2018-12-09 DIAGNOSIS — I10 Essential (primary) hypertension: Secondary | ICD-10-CM | POA: Insufficient documentation

## 2018-12-09 DIAGNOSIS — Z419 Encounter for procedure for purposes other than remedying health state, unspecified: Secondary | ICD-10-CM

## 2018-12-09 DIAGNOSIS — Z7983 Long term (current) use of bisphosphonates: Secondary | ICD-10-CM | POA: Diagnosis not present

## 2018-12-09 HISTORY — PX: VIDEO BRONCHOSCOPY WITH ENDOBRONCHIAL NAVIGATION: SHX6175

## 2018-12-09 HISTORY — DX: Pneumonia, unspecified organism: J18.9

## 2018-12-09 LAB — COMPREHENSIVE METABOLIC PANEL
ALT: 18 U/L (ref 0–44)
AST: 22 U/L (ref 15–41)
Albumin: 3.9 g/dL (ref 3.5–5.0)
Alkaline Phosphatase: 83 U/L (ref 38–126)
Anion gap: 13 (ref 5–15)
BUN: 13 mg/dL (ref 8–23)
CO2: 24 mmol/L (ref 22–32)
Calcium: 9.1 mg/dL (ref 8.9–10.3)
Chloride: 103 mmol/L (ref 98–111)
Creatinine, Ser: 0.98 mg/dL (ref 0.44–1.00)
GFR calc Af Amer: >60 mL/min (ref >60)
GFR calc non Af Amer: 58 mL/min — ABNORMAL LOW (ref >60)
Glucose, Bld: 102 mg/dL — ABNORMAL HIGH (ref 70–99)
Potassium: 4.3 mmol/L (ref 3.5–5.1)
Sodium: 140 mmol/L (ref 135–145)
Total Bilirubin: 0.6 mg/dL (ref 0.3–1.2)
Total Protein: 6.8 g/dL (ref 6.5–8.1)

## 2018-12-09 LAB — CBC
HCT: 41.6 % (ref 36.0–46.0)
Hemoglobin: 13.7 g/dL (ref 12.0–15.0)
MCH: 32.2 pg (ref 26.0–34.0)
MCHC: 32.9 g/dL (ref 30.0–36.0)
MCV: 97.9 fL (ref 80.0–100.0)
Platelets: 175 10*3/uL (ref 150–400)
RBC: 4.25 MIL/uL (ref 3.87–5.11)
RDW: 13.2 % (ref 11.5–15.5)
WBC: 6.7 10*3/uL (ref 4.0–10.5)
nRBC: 0 % (ref 0.0–0.2)

## 2018-12-09 LAB — APTT: aPTT: 26 seconds (ref 24–36)

## 2018-12-09 LAB — PROTIME-INR
INR: 1 (ref 0.8–1.2)
Prothrombin Time: 13.1 seconds (ref 11.4–15.2)

## 2018-12-09 LAB — GLUCOSE, CAPILLARY: Glucose-Capillary: 101 mg/dL — ABNORMAL HIGH (ref 70–99)

## 2018-12-09 SURGERY — VIDEO BRONCHOSCOPY WITH ENDOBRONCHIAL NAVIGATION
Anesthesia: General

## 2018-12-09 MED ORDER — SODIUM CHLORIDE 0.9 % IV SOLN
INTRAVENOUS | Status: DC | PRN
  Administered 2018-12-09: 30 ug/min via INTRAVENOUS

## 2018-12-09 MED ORDER — PHENYLEPHRINE 40 MCG/ML (10ML) SYRINGE FOR IV PUSH (FOR BLOOD PRESSURE SUPPORT)
PREFILLED_SYRINGE | INTRAVENOUS | Status: DC | PRN
  Administered 2018-12-09 (×2): 80 ug via INTRAVENOUS

## 2018-12-09 MED ORDER — FENTANYL CITRATE (PF) 100 MCG/2ML IJ SOLN: 25.0000 ug | INTRAMUSCULAR | Status: DC | PRN

## 2018-12-09 MED ORDER — GLYCOPYRROLATE PF 0.2 MG/ML IJ SOSY
PREFILLED_SYRINGE | INTRAMUSCULAR | Status: AC
  Filled 2018-12-09: qty 1

## 2018-12-09 MED ORDER — LIDOCAINE 2% (20 MG/ML) 5 ML SYRINGE
INTRAMUSCULAR | Status: AC
  Filled 2018-12-09: qty 5

## 2018-12-09 MED ORDER — ONDANSETRON HCL 4 MG/2ML IJ SOLN: 4.0000 mg | Freq: Once | INTRAMUSCULAR | Status: DC | PRN

## 2018-12-09 MED ORDER — EPHEDRINE SULFATE-NACL 50-0.9 MG/10ML-% IV SOSY
PREFILLED_SYRINGE | INTRAVENOUS | Status: DC | PRN
  Administered 2018-12-09: 5 mg via INTRAVENOUS

## 2018-12-09 MED ORDER — GLYCOPYRROLATE PF 0.2 MG/ML IJ SOSY
PREFILLED_SYRINGE | INTRAMUSCULAR | Status: DC | PRN
  Administered 2018-12-09: .1 mg via INTRAVENOUS

## 2018-12-09 MED ORDER — ROCURONIUM BROMIDE 10 MG/ML (PF) SYRINGE
PREFILLED_SYRINGE | INTRAVENOUS | Status: AC
  Filled 2018-12-09: qty 10

## 2018-12-09 MED ORDER — FENTANYL CITRATE (PF) 100 MCG/2ML IJ SOLN
INTRAMUSCULAR | Status: DC | PRN
  Administered 2018-12-09 (×3): 50 ug via INTRAVENOUS

## 2018-12-09 MED ORDER — MEPERIDINE HCL 25 MG/ML IJ SOLN: 6.2500 mg | INTRAMUSCULAR | Status: DC | PRN

## 2018-12-09 MED ORDER — ONDANSETRON HCL 4 MG/2ML IJ SOLN
INTRAMUSCULAR | Status: AC
  Filled 2018-12-09: qty 2

## 2018-12-09 MED ORDER — SUGAMMADEX SODIUM 200 MG/2ML IV SOLN
INTRAVENOUS | Status: DC | PRN
  Administered 2018-12-09: 200 mg via INTRAVENOUS

## 2018-12-09 MED ORDER — LIDOCAINE 2% (20 MG/ML) 5 ML SYRINGE
INTRAMUSCULAR | Status: DC | PRN
  Administered 2018-12-09: 100 mg via INTRAVENOUS

## 2018-12-09 MED ORDER — EPHEDRINE 5 MG/ML INJ
INTRAVENOUS | Status: AC
  Filled 2018-12-09: qty 10

## 2018-12-09 MED ORDER — PROPOFOL 10 MG/ML IV BOLUS
INTRAVENOUS | Status: AC
  Filled 2018-12-09: qty 20

## 2018-12-09 MED ORDER — 0.9 % SODIUM CHLORIDE (POUR BTL) OPTIME
TOPICAL | Status: DC | PRN
  Administered 2018-12-09: 08:00:00 1000 mL

## 2018-12-09 MED ORDER — ROCURONIUM BROMIDE 50 MG/5ML IV SOSY
PREFILLED_SYRINGE | INTRAVENOUS | Status: DC | PRN
  Administered 2018-12-09: 10 mg via INTRAVENOUS
  Administered 2018-12-09: 50 mg via INTRAVENOUS
  Administered 2018-12-09: 10 mg via INTRAVENOUS

## 2018-12-09 MED ORDER — FENTANYL CITRATE (PF) 250 MCG/5ML IJ SOLN
INTRAMUSCULAR | Status: AC
  Filled 2018-12-09: qty 5

## 2018-12-09 MED ORDER — MIDAZOLAM HCL 5 MG/5ML IJ SOLN
INTRAMUSCULAR | Status: DC | PRN
  Administered 2018-12-09: 2 mg via INTRAVENOUS

## 2018-12-09 MED ORDER — PROPOFOL 10 MG/ML IV BOLUS
INTRAVENOUS | Status: DC | PRN
  Administered 2018-12-09: 120 mg via INTRAVENOUS

## 2018-12-09 MED ORDER — DEXAMETHASONE SODIUM PHOSPHATE 10 MG/ML IJ SOLN
INTRAMUSCULAR | Status: DC | PRN
  Administered 2018-12-09: 8 mg via INTRAVENOUS

## 2018-12-09 MED ORDER — LACTATED RINGERS IV SOLN
INTRAVENOUS | Status: DC | PRN
  Administered 2018-12-09: 07:00:00 via INTRAVENOUS

## 2018-12-09 MED ORDER — DEXAMETHASONE SODIUM PHOSPHATE 10 MG/ML IJ SOLN
INTRAMUSCULAR | Status: AC
  Filled 2018-12-09: qty 1

## 2018-12-09 MED ORDER — MIDAZOLAM HCL 2 MG/2ML IJ SOLN
INTRAMUSCULAR | Status: AC
  Filled 2018-12-09: qty 2

## 2018-12-09 MED ORDER — ONDANSETRON HCL 4 MG/2ML IJ SOLN
INTRAMUSCULAR | Status: DC | PRN
  Administered 2018-12-09: 4 mg via INTRAVENOUS

## 2018-12-09 MED ORDER — SUCCINYLCHOLINE CHLORIDE 200 MG/10ML IV SOSY
PREFILLED_SYRINGE | INTRAVENOUS | Status: AC
  Filled 2018-12-09: qty 10

## 2018-12-09 MED ORDER — PHENYLEPHRINE 40 MCG/ML (10ML) SYRINGE FOR IV PUSH (FOR BLOOD PRESSURE SUPPORT)
PREFILLED_SYRINGE | INTRAVENOUS | Status: AC
  Filled 2018-12-09: qty 10

## 2018-12-09 SURGICAL SUPPLY — 43 items
ADAPTER BRONCHOSCOPE OLYMP 190 (ADAPTER) ×3 IMPLANT
ADAPTER BRONCHOSCOPE OLYMPUS (ADAPTER) ×3 IMPLANT
ADAPTER VALVE BIOPSY EBUS (MISCELLANEOUS) IMPLANT
ADPTR VALVE BIOPSY EBUS (MISCELLANEOUS)
BRUSH BIOPSY BRONCH 10 SDTNB (MISCELLANEOUS) IMPLANT
BRUSH BIOPSY BRONCH 10MM SDTNB (MISCELLANEOUS)
BRUSH SUPERTRAX BIOPSY (INSTRUMENTS) IMPLANT
BRUSH SUPERTRAX NDL-TIP CYTO (INSTRUMENTS) ×3 IMPLANT
CANISTER SUCT 3000ML PPV (MISCELLANEOUS) ×3 IMPLANT
CHANNEL WORK EXTEND EDGE 180 (KITS) IMPLANT
CHANNEL WORK EXTEND EDGE 90 (KITS) IMPLANT
CONT SPEC 4OZ CLIKSEAL STRL BL (MISCELLANEOUS) ×3 IMPLANT
COVER BACK TABLE 60X90IN (DRAPES) ×3 IMPLANT
FILTER STRAW FLUID ASPIR (MISCELLANEOUS) IMPLANT
FORCEPS BIOP SUPERTRX PREMAR (INSTRUMENTS) ×3 IMPLANT
GAUZE SPONGE 4X4 12PLY STRL (GAUZE/BANDAGES/DRESSINGS) ×3 IMPLANT
GLOVE BIOGEL PI IND STRL 6.5 (GLOVE) ×1 IMPLANT
GLOVE BIOGEL PI IND STRL 7.5 (GLOVE) ×1 IMPLANT
GLOVE BIOGEL PI INDICATOR 6.5 (GLOVE) ×2
GLOVE BIOGEL PI INDICATOR 7.5 (GLOVE) ×2
GOWN STRL REUS W/ TWL XL LVL3 (GOWN DISPOSABLE) ×1 IMPLANT
GOWN STRL REUS W/TWL XL LVL3 (GOWN DISPOSABLE) ×2
KIT CLEAN ENDO COMPLIANCE (KITS) ×3 IMPLANT
KIT ILLUMISITE 180 PROCEDURE (KITS) ×3 IMPLANT
KIT PROCEDURE EDGE 180 (KITS) IMPLANT
KIT PROCEDURE EDGE 90 (KITS) IMPLANT
MARKER SKIN DUAL TIP RULER LAB (MISCELLANEOUS) ×3 IMPLANT
NEEDLE SUPERTRX PREMARK BIOPSY (NEEDLE) ×3 IMPLANT
NS IRRIG 1000ML POUR BTL (IV SOLUTION) ×3 IMPLANT
OIL SILICONE PENTAX (PARTS (SERVICE/REPAIRS)) ×3 IMPLANT
PATCHES PATIENT (LABEL) ×9 IMPLANT
SYR 20ML ECCENTRIC (SYRINGE) ×3 IMPLANT
SYR 20ML LL LF (SYRINGE) ×3 IMPLANT
SYR 30ML LL (SYRINGE) ×3 IMPLANT
TOWEL GREEN STERILE FF (TOWEL DISPOSABLE) ×3 IMPLANT
TRAP SPECIMEN MUCOUS 40CC (MISCELLANEOUS) ×3 IMPLANT
TUBE CONNECTING 20'X1/4 (TUBING) ×2
TUBE CONNECTING 20X1/4 (TUBING) ×4 IMPLANT
UNDERPAD 30X30 (UNDERPADS AND DIAPERS) ×3 IMPLANT
VALVE BIOPSY  SINGLE USE (MISCELLANEOUS) ×2
VALVE BIOPSY SINGLE USE (MISCELLANEOUS) ×1 IMPLANT
VALVE SUCTION BRONCHIO DISP (MISCELLANEOUS) ×3 IMPLANT
WATER STERILE IRR 1000ML POUR (IV SOLUTION) ×3 IMPLANT

## 2018-12-09 NOTE — H&P (Signed)
[address]       [place] [address]             [phone]                                       No changes since her clinic appointment.  She has been smoke free since Friday  Solitary left upper lobe pulmonary nodule OR today for navigational bronchoscopy             Per my last note [facility] Record #[id number] Date of Birth: 21-May-1945  Referring: Beverley Fiedler, * Primary Care: Shelda Pal, DO Primary Cardiologist: No primary care provider on file.  Chief Complaint:        Chief Complaint  Patient presents with   Lung Mass    Surgical consult    History of Present Illness:    Jennifer [name] 73 y.o. female presents for surgical evaluation of a 1.9cm left upper lobe pulmonary nodule.  She was seen by a new PCP, and due to her smoking Hx, and CT chest was ordered which identified this nodule.  She denies any neurologic symptoms, and her weight has been stable.  She has a chronic cough, but does not have any exertional dyspnea.  She continues to smoke, and this has been worst with her anxiety    Smoking Hx: Ppd since close to 68yrs   Zubrod Score: At the time of surgery this patients most appropriate activity status/level should be described as: [x] ?    0    Normal activity, no symptoms [] ?    1    Restricted in physical strenuous activity but ambulatory, able to do out light work [] ?    2    Ambulatory and capable of self care, unable to do work activities, up and about               >50 % of waking hours                              [] ?    3    Only limited self care, in bed greater than 50% of waking hours [] ?    4    Completely disabled, no self care, confined to bed or chair [] ?    5    Moribund       Past Medical History:  Diagnosis Date   History of chicken pox    Hyperlipidemia    Hypertension    Menopause    Osteoporosis    Pre-diabetes    Vitamin D  deficiency     No past surgical history on file.  No family history on file.   Social History       Tobacco Use  Smoking Status Current Every Day Smoker   Packs/day: 0.50   Types: Cigarettes  Smokeless Tobacco Never Used    Social History      Substance and Sexual Activity  Alcohol Use No         Allergies  Allergen Reactions   Aspirin Hives   Latex Rash          Current Outpatient Medications  Medication Sig Dispense Refill   lisinopril (PRINIVIL,ZESTRIL) 10 MG tablet TAKE 1 TABLET (10 MG TOTAL) BY MOUTH DAILY. (Patient taking  differently: 30 mg daily. 30 mg daily) 90 tablet 1   alendronate (FOSAMAX) 10 MG tablet Take 10 mg by mouth daily.     atorvastatin (LIPITOR) 40 MG tablet Take 40 mg by mouth daily.     No current facility-administered medications for this visit.     Review of Systems  Constitutional: Negative.   HENT: Negative.   Eyes: Negative.   Respiratory: Positive for cough and sputum production. Negative for shortness of breath.   Cardiovascular: Negative.   Gastrointestinal: Negative.   Genitourinary: Negative.   Musculoskeletal: Negative.   Skin: Negative.   Neurological: Negative.   Psychiatric/Behavioral: Positive for depression. The patient is nervous/anxious.      PHYSICAL EXAMINATION: BP (!) 188/72 (BP Location: Right Arm)    Pulse 69    Temp 97.9 F (36.6 C) (Skin)    Resp 20    Ht 5\' 9"  (1.753 m)    Wt 155 lb 9.6 oz (70.6 kg)    SpO2 97% Comment: ra   BMI 22.98 kg/m  Physical Exam  Constitutional: She is oriented to person, place, and time. She appears well-developed and well-nourished. No distress.  HENT:  Head: Normocephalic and atraumatic.  Eyes: Conjunctivae and EOM are normal.  Neck: Normal range of motion. Neck supple. No tracheal deviation present.  Cardiovascular: Normal rate, regular rhythm and normal heart sounds.  No murmur heard. Respiratory: Effort normal and breath sounds normal. No  respiratory distress. She has no wheezes.  GI: Soft. She exhibits no distension.  Musculoskeletal: Normal range of motion.        General: No edema.  Neurological: She is alert and oriented to person, place, and time.  Skin: Skin is warm and dry. She is not diaphoretic.  Psychiatric: She has a normal mood and affect.    Diagnostic Studies & Laboratory data:     Recent Radiology Findings:    Imaging Results  Nm Pet Image Initial (pi) Skull Base To Thigh  Result Date: 12/01/2018 CLINICAL DATA:  Subsequent treatment strategy for pulmonary nodule. EXAM: NUCLEAR MEDICINE PET SKULL BASE TO THIGH TECHNIQUE: 7.27 mCi F-18 FDG was injected intravenously. Full-ring PET imaging was performed from the skull base to thigh after the radiotracer. CT data was obtained and used for attenuation correction and anatomic localization. Fasting blood glucose: 116 mg/dl COMPARISON:  None available FINDINGS: Mediastinal blood pool activity: SUV max 2.1 Liver activity: SUV max 3.3 NECK: No hypermetabolic lymph nodes in the neck. Incidental CT findings: none CHEST: Within the LEFT upper lobe rounded nodule with spiculated margin measures 1.8 x 1.9 cm and has moderate metabolic activity with SUV max equal 4.2. No additional suspicious pulmonary nodules. No enlarged or hypermetabolic mediastinal lymph nodes. Incidental CT findings: none ABDOMEN/PELVIS: No abnormal hypermetabolic activity within the liver, pancreas, adrenal glands, or spleen. No hypermetabolic lymph nodes in the abdomen or pelvis. Enlargement of the LEFT adrenal gland has low attenuation consistent benign adenoma. Incidental CT findings: Atherosclerotic calcification of the aorta. Uterus and ovaries normal. SKELETON: No focal hypermetabolic activity to suggest skeletal metastasis. Incidental CT findings: none IMPRESSION: 1. Hypermetabolic LEFT upper lobe pulmonary nodule most consistent with primary bronchogenic carcinoma. No hypermetabolic mediastinal  lymphadenopathy. No distant metastatic disease. PET-CT staging T1b N0 M0. 2. Benign LEFT adrenal adenoma. 3.  Aortic Atherosclerosis (ICD10-I70.0). Electronically Signed   By: Suzy Bouchard M.D.   On: 12/01/2018 12:28        I have independently reviewed the above radiology studies  and reviewed the findings  with the patient.   Recent Lab Findings: Recent Labs       Lab Results  Component Value Date   WBC 10.5 02/23/2011   HGB 15.2 02/23/2011   PLT 136 (A) 02/23/2011   CHOL 209 (A) 10/28/2009   TRIG 220 (A) 10/28/2009   HDL 41 10/28/2009   LDLCALC 124 10/28/2009   CREATININE 0.8 10/28/2009       PFTs: - FVC: 97% - FEV1: 82% -DLCO: 86%  Problem List: 1.9cm LUL pulmonary nodule with SUV of 4.2 Left adrenal adenoma  Assessment / Plan:   73 yo female with a 1.9cm LUL pulmonary nodule concerning for primary lung cancer.  She continues to smoke, thus has been counseled on cessation.  She would like to proceed with a navigational bronchoscopy, and biopsy, which has been scheduled for Oct 12 or 13.  In that time she will plan to stop smoking.  If this is a primary lung cancer, she would like to proceed with surgical resection over SBRT, and understands the importance of smoking cessation.      Glenford Garis Bary Leriche

## 2018-12-09 NOTE — Anesthesia Procedure Notes (Signed)
Procedure Name: Intubation Date/Time: 12/09/2018 7:37 AM Performed by: Orlie Dakin, CRNA Pre-anesthesia Checklist: Patient identified, Emergency Drugs available, Suction available and Patient being monitored Patient Re-evaluated:Patient Re-evaluated prior to induction Oxygen Delivery Method: Circle system utilized Preoxygenation: Pre-oxygenation with 100% oxygen Induction Type: IV induction Ventilation: Mask ventilation without difficulty Laryngoscope Size: Bachand and 3 Grade View: Grade I Tube type: Oral Tube size: 8.5 mm Number of attempts: 1 Airway Equipment and Method: Stylet Placement Confirmation: ETT inserted through vocal cords under direct vision,  positive ETCO2 and breath sounds checked- equal and bilateral Secured at: 23 cm Tube secured with: Tape Dental Injury: Teeth and Oropharynx as per pre-operative assessment  Comments: 4x4s bite block used.

## 2018-12-09 NOTE — Op Note (Signed)
     [address]       [place] [address]             [phone]        12/09/2018  Patient: Jennifer [name] Pre-operative Diagnosis: left upper lobe solitary pulmonary nodule Post-operative Diagnosis: same Procedure(s): Video bronchoscopy Navigational bronchoscopy with transbronchial biopsy Catheter directed broncho-alveolar lavage  Surgeon(s) and Role:    * Daney Moor, Lucile Crater, MD - Primary  Anesthesia  General EBL: minimal  Blood Administered: none Specimen: left upper lobe FNA, brushing, core biopsy, and BAL  Indication: Jennifer [name] y.o.femalepresents for surgical evaluation of a 1.9cm left upper lobe pulmonary nodule. She was seen by a new PCP, and due to her smoking Hx, and CT chest was ordered which identified this nodule. She requires a tissue diagnosis  Findings: Good localization.  Adequate tissue sample.    Operative Technique: Planning for the navigational bronchoscopy was done prior to induction.  Anesthesia was induced.  Pre-operative antibiotics were dose, and the patient was prepped and draped in normal sterile fashion.  An appropriate surgical pause was performed.      Flexible fiberoptic bronchoscopy was performed via the endotracheal tube.  It revealed normal endobronchial anatomy with no endobronchial lesions to the level of the subsegmental bronchi.     The bronchoscope was advanced through the ET tube, and the locatable guide for navigation was placed and registration was performed.  There was good correlation of the video and virtual bronchoscopy.  The guide was advanced to the appropriate subsegmental bronchus and advanced to within 2 centimeters of the center of the lesion.  Fluoroscopy was used for all sampling.  Multiple needle aspirations and brushings were performed.  While awaiting the results of the aspirations, multiple biopsies were performed.  These were sent for permanent pathology. A segmental lavage was performed  through the LG catheter.   The bronchoscope was reinserted and a final inspection was made.  There was no significant ongoing bleeding.  The bronchoscope was removed.  The bronchoscope was withdrawn.   The patient was extubated in the operating room and taken to the PACU in good condition.

## 2018-12-09 NOTE — Anesthesia Postprocedure Evaluation (Signed)
Anesthesia Post Note  Patient: Jennifer [name]  Procedure(s) Performed: VIDEO BRONCHOSCOPY WITH ENDOBRONCHIAL NAVIGATION (N/A )     Patient location during evaluation: PACU Anesthesia Type: General Level of consciousness: sedated and patient cooperative Pain management: pain level controlled Vital Signs Assessment: post-procedure vital signs reviewed and stable Respiratory status: spontaneous breathing Cardiovascular status: stable Anesthetic complications: no    Last Vitals:  Vitals:   12/09/18 0604 12/09/18 0910  BP: (!) 175/70 (!) 117/57  Pulse: 85 66  Resp: 20 19  Temp: 36.6 C 36.5 C  SpO2: 100% 100%    Last Pain:  Vitals:   12/09/18 0920  TempSrc:   PainSc: 0-No pain                 Nolon Nations

## 2018-12-09 NOTE — Transfer of Care (Signed)
Immediate Anesthesia Transfer of Care Note  Patient: Jennifer [name]  Procedure(s) Performed: VIDEO BRONCHOSCOPY WITH ENDOBRONCHIAL NAVIGATION (N/A )  Patient Location: PACU  Anesthesia Type:General  Level of Consciousness: drowsy  Airway & Oxygen Therapy: Patient Spontanous Breathing and Patient connected to face mask oxygen  Post-op Assessment: Report given to RN and Post -op Vital signs reviewed and stable  Post vital signs: Reviewed and stable  Last Vitals:  Vitals Value Taken Time  BP    Temp    Pulse 62 12/09/18 0907  Resp 24 12/09/18 0907  SpO2 100 % 12/09/18 0907  Vitals shown include unvalidated device data.  Last Pain:  Vitals:   12/09/18 0639  TempSrc:   PainSc: 0-No pain         Complications: No apparent anesthesia complications

## 2018-12-09 NOTE — Discharge Summary (Signed)
Physician Discharge Summary   Patient ID: Jennifer [name] [id number] 73 y.o. Oct 07, 1945  Admit date: 12/09/2018  Discharge date and time: No discharge date for patient encounter.   Admitting Physician: Lajuana Matte, MD   Discharge Physician: Kipp Brood  Admission Diagnoses: PULMONARY NODULE  Discharge Diagnoses: s/p transbronchial biopsy  Admission Condition: good  Discharged Condition: good   Hospital Course: uncomplicated.  Discharged from PACU following procedure   Disposition: Discharge disposition: 01-Home or Self Care       Patient Instructions:  Allergies as of 12/09/2018      Reactions   Aspirin Hives   Latex Rash      Medication List    TAKE these medications   alendronate 10 MG tablet Commonly known as: FOSAMAX Take 10 mg by mouth daily.   atorvastatin 40 MG tablet Commonly known as: LIPITOR Take 40 mg by mouth daily.   CALCIUM 600 + D PO Take 1 tablet by mouth daily.   lisinopril 30 MG tablet Commonly known as: ZESTRIL Take 30 mg by mouth daily.   lisinopril 10 MG tablet Commonly known as: ZESTRIL TAKE 1 TABLET (10 MG TOTAL) BY MOUTH DAILY.      Activity: activity as tolerated Diet: regular diet Wound Care: none needed  Dr. Kipp Brood will call with results.  SignedLajuana Matte 12/09/2018 9:25 AM

## 2018-12-10 ENCOUNTER — Encounter (HOSPITAL_COMMUNITY): Payer: Self-pay | Admitting: Thoracic Surgery (Cardiothoracic Vascular Surgery)

## 2018-12-11 LAB — SURGICAL PATHOLOGY

## 2018-12-12 LAB — CYTOLOGY - NON PAP

## 2018-12-17 ENCOUNTER — Other Ambulatory Visit: Payer: Self-pay

## 2018-12-17 ENCOUNTER — Other Ambulatory Visit: Payer: Self-pay | Admitting: Thoracic Surgery (Cardiothoracic Vascular Surgery)

## 2018-12-17 ENCOUNTER — Other Ambulatory Visit: Payer: Self-pay | Admitting: Surgery

## 2018-12-17 ENCOUNTER — Encounter: Payer: Self-pay | Admitting: *Deleted

## 2018-12-17 ENCOUNTER — Other Ambulatory Visit: Payer: Self-pay | Admitting: *Deleted

## 2018-12-17 DIAGNOSIS — Z01818 Encounter for other preprocedural examination: Secondary | ICD-10-CM

## 2018-12-17 DIAGNOSIS — R059 Cough, unspecified: Secondary | ICD-10-CM

## 2018-12-17 DIAGNOSIS — R05 Cough: Secondary | ICD-10-CM

## 2018-12-17 DIAGNOSIS — I10 Essential (primary) hypertension: Secondary | ICD-10-CM

## 2018-12-17 DIAGNOSIS — R0602 Shortness of breath: Secondary | ICD-10-CM

## 2018-12-17 DIAGNOSIS — R911 Solitary pulmonary nodule: Secondary | ICD-10-CM

## 2018-12-17 DIAGNOSIS — Z87891 Personal history of nicotine dependence: Secondary | ICD-10-CM

## 2018-12-17 DIAGNOSIS — E119 Type 2 diabetes mellitus without complications: Secondary | ICD-10-CM

## 2018-12-17 DIAGNOSIS — Z720 Tobacco use: Secondary | ICD-10-CM

## 2018-12-17 DIAGNOSIS — G3281 Cerebellar ataxia in diseases classified elsewhere: Secondary | ICD-10-CM

## 2018-12-18 ENCOUNTER — Encounter: Payer: Self-pay | Admitting: *Deleted

## 2018-12-18 ENCOUNTER — Other Ambulatory Visit: Payer: Self-pay | Admitting: Thoracic Surgery (Cardiothoracic Vascular Surgery)

## 2018-12-18 ENCOUNTER — Other Ambulatory Visit: Payer: Self-pay | Admitting: *Deleted

## 2018-12-18 DIAGNOSIS — Z23 Encounter for immunization: Secondary | ICD-10-CM | POA: Diagnosis not present

## 2018-12-18 DIAGNOSIS — R0602 Shortness of breath: Secondary | ICD-10-CM

## 2018-12-18 NOTE — Progress Notes (Signed)
The proposed treatment discussed in cancer conference 12/18/18 is for discussion purpose only and is not a binding recommendation.  The patient was not physically examined nor present for their treatment options.  Therefore, final treatment plans cannot be decided.

## 2018-12-19 ENCOUNTER — Telehealth (HOSPITAL_COMMUNITY): Payer: Self-pay | Admitting: *Deleted

## 2018-12-19 NOTE — Telephone Encounter (Signed)
Patient given detailed instructions per Myocardial Perfusion Study Information Sheet for the test on 12/19/18. Patient notified to arrive 15 minutes early and that it is imperative to arrive on time for appointment to keep from having the test rescheduled.  If you need to cancel or reschedule your appointment, please call the office within 24 hours of your appointment. . Patient verbalized understanding. Kirstie Peri

## 2018-12-22 ENCOUNTER — Other Ambulatory Visit: Payer: Self-pay

## 2018-12-22 ENCOUNTER — Ambulatory Visit (HOSPITAL_COMMUNITY): Payer: Medicare Other | Attending: Cardiovascular Disease

## 2018-12-22 DIAGNOSIS — R0602 Shortness of breath: Secondary | ICD-10-CM | POA: Diagnosis not present

## 2018-12-22 LAB — MYOCARDIAL PERFUSION IMAGING
LV dias vol: 69 mL (ref 46–106)
LV sys vol: 18 mL
Peak HR: 111 {beats}/min
Rest HR: 71 {beats}/min
SDS: 1
SRS: 0
SSS: 1
TID: 0.96

## 2018-12-22 MED ORDER — TECHNETIUM TC 99M TETROFOSMIN IV KIT
10.2000 | PACK | Freq: Once | INTRAVENOUS | Status: AC | PRN
  Administered 2018-12-22: 10.2 via INTRAVENOUS
  Filled 2018-12-22: qty 11

## 2018-12-22 MED ORDER — REGADENOSON 0.4 MG/5ML IV SOLN
0.4000 mg | Freq: Once | INTRAVENOUS | Status: AC
  Administered 2018-12-22: 0.4 mg via INTRAVENOUS

## 2018-12-22 MED ORDER — TECHNETIUM TC 99M TETROFOSMIN IV KIT
32.7000 | PACK | Freq: Once | INTRAVENOUS | Status: AC | PRN
  Administered 2018-12-22: 32.7 via INTRAVENOUS
  Filled 2018-12-22: qty 33

## 2018-12-25 ENCOUNTER — Other Ambulatory Visit: Payer: Self-pay

## 2018-12-25 ENCOUNTER — Encounter (HOSPITAL_COMMUNITY): Payer: Self-pay

## 2018-12-25 ENCOUNTER — Encounter (HOSPITAL_COMMUNITY)
Admission: RE | Admit: 2018-12-25 | Discharge: 2018-12-25 | Disposition: A | Discharge status: Home / Self Care | Payer: Medicare Other | Source: Ambulatory Visit | Attending: Thoracic Surgery (Cardiothoracic Vascular Surgery) | Admitting: Thoracic Surgery (Cardiothoracic Vascular Surgery)

## 2018-12-25 ENCOUNTER — Other Ambulatory Visit (HOSPITAL_COMMUNITY): Payer: Medicare Other

## 2018-12-25 DIAGNOSIS — R911 Solitary pulmonary nodule: Secondary | ICD-10-CM | POA: Diagnosis not present

## 2018-12-25 DIAGNOSIS — Z01812 Encounter for preprocedural laboratory examination: Secondary | ICD-10-CM | POA: Diagnosis not present

## 2018-12-25 LAB — URINALYSIS, ROUTINE W REFLEX MICROSCOPIC
Bacteria, UA: NONE SEEN
Bilirubin Urine: NEGATIVE
Glucose, UA: NEGATIVE mg/dL
Ketones, ur: NEGATIVE mg/dL
Nitrite: NEGATIVE
Protein, ur: NEGATIVE mg/dL
Specific Gravity, Urine: 1.004 — ABNORMAL LOW (ref 1.005–1.030)
pH: 8 (ref 5.0–8.0)

## 2018-12-25 LAB — CBC
HCT: 40.8 % (ref 36.0–46.0)
Hemoglobin: 13.7 g/dL (ref 12.0–15.0)
MCH: 32.2 pg (ref 26.0–34.0)
MCHC: 33.6 g/dL (ref 30.0–36.0)
MCV: 96 fL (ref 80.0–100.0)
Platelets: 187 10*3/uL (ref 150–400)
RBC: 4.25 MIL/uL (ref 3.87–5.11)
RDW: 12.9 % (ref 11.5–15.5)
WBC: 8.1 10*3/uL (ref 4.0–10.5)
nRBC: 0 % (ref 0.0–0.2)

## 2018-12-25 LAB — ABO/RH: ABO/RH(D): A POS

## 2018-12-25 LAB — SURGICAL PCR SCREEN
MRSA, PCR: NEGATIVE
Staphylococcus aureus: POSITIVE — AB

## 2018-12-25 LAB — COMPREHENSIVE METABOLIC PANEL
ALT: 23 U/L (ref 0–44)
AST: 21 U/L (ref 15–41)
Albumin: 4.3 g/dL (ref 3.5–5.0)
Alkaline Phosphatase: 94 U/L (ref 38–126)
Anion gap: 12 (ref 5–15)
BUN: 10 mg/dL (ref 8–23)
CO2: 22 mmol/L (ref 22–32)
Calcium: 9.6 mg/dL (ref 8.9–10.3)
Chloride: 105 mmol/L (ref 98–111)
Creatinine, Ser: 0.86 mg/dL (ref 0.44–1.00)
GFR calc Af Amer: >60 mL/min (ref >60)
GFR calc non Af Amer: >60 mL/min (ref >60)
Glucose, Bld: 98 mg/dL (ref 70–99)
Potassium: 4.2 mmol/L (ref 3.5–5.1)
Sodium: 139 mmol/L (ref 135–145)
Total Bilirubin: 1 mg/dL (ref 0.3–1.2)
Total Protein: 7 g/dL (ref 6.5–8.1)

## 2018-12-25 LAB — PROTIME-INR
INR: 1 (ref 0.8–1.2)
Prothrombin Time: 13 seconds (ref 11.4–15.2)

## 2018-12-25 LAB — APTT: aPTT: 23 seconds — ABNORMAL LOW (ref 24–36)

## 2018-12-25 NOTE — Progress Notes (Signed)
[place], [place] [address] Phone: [phone] Fax: [phone]      Your procedure is scheduled on 12/29/18.  Report to [facility] Main Entrance "A" at 5:30 A.M., and check in at the Admitting office.  Call this number if you have problems the morning of surgery:  [phone]  Call [phone] if you have any questions prior to your surgery date Monday-Friday 8am-4pm    Remember:  Do not eat or drink after midnight the night before your surgery     Take these medicines the morning of surgery with A SIP OF WATER: alendronate (FOSAMAX) atorvastatin (LIPITOR)   As of today, STOP taking any Aspirin (unless otherwise instructed by your surgeon), Aleve, Naproxen, Ibuprofen, Motrin, Advil, Goody's, BC's, all herbal medications, fish oil, and all vitamins.    The Morning of Surgery  Do not wear jewelry, make-up or nail polish.  Do not wear lotions, powders, or perfumes or deodorant  Do not shave 48 hours prior to surgery.   Do not bring valuables to the hospital.  [facility] is not responsible for any belongings or valuables.  If you are a smoker, DO NOT Smoke 24 hours prior to surgery IF you wear a CPAP at night please bring your mask, tubing, and machine the morning of surgery   Remember that you must have someone to transport you home after your surgery, and remain with you for 24 hours if you are discharged the same day.   Contacts, glasses, hearing aids, dentures or bridgework may not be worn into surgery.    Leave your suitcase in the car.  After surgery it may be brought to your room.  For patients admitted to the hospital, discharge time will be determined by your treatment team.  Patients discharged the day of surgery will not be allowed to drive home.    Special instructions:   [facility]- Preparing For Surgery  Before surgery, you can play an important role.  Because skin is not sterile, your skin needs to be as free of germs as possible. You can reduce the number of germs on your skin by washing with CHG (chlorahexidine gluconate) Soap before surgery.  CHG is an antiseptic cleaner which kills germs and bonds with the skin to continue killing germs even after washing.    Oral Hygiene is also important to reduce your risk of infection.  Remember - BRUSH YOUR TEETH THE MORNING OF SURGERY WITH YOUR REGULAR TOOTHPASTE  Please do not use if you have an allergy to CHG or antibacterial soaps. If your skin becomes reddened/irritated stop using the CHG.  Do not shave (including legs and underarms) for at least 48 hours prior to first CHG shower. It is OK to shave your face.  Please follow these instructions carefully.   1. Shower the NIGHT BEFORE SURGERY and the MORNING OF SURGERY with CHG Soap.   2. If you chose to wash your hair, wash your hair first as usual with your normal shampoo.  3. After you shampoo, rinse your hair and body thoroughly to remove the shampoo.  4. Use CHG as you would any other liquid soap. You can apply CHG directly to the skin and wash gently with a scrungie or a clean washcloth.   5. Apply the CHG Soap to your body ONLY FROM THE NECK DOWN.  Do not use on open wounds or open sores. Avoid contact with your eyes,  ears, mouth and genitals (private parts). Wash Face and genitals (private parts)  with your normal soap.   6. Wash thoroughly, paying special attention to the area where your surgery will be performed.  7. Thoroughly rinse your body with warm water from the neck down.  8. DO NOT shower/wash with your normal soap after using and rinsing off the CHG Soap.  9. Pat yourself dry with a CLEAN TOWEL.  10. Wear CLEAN PAJAMAS to bed the night before surgery, wear comfortable clothes the morning of surgery  11. Place CLEAN SHEETS on your bed the night of your first shower and DO NOT SLEEP WITH PETS.    Day of Surgery:  Do  not apply any deodorants/lotions. Please shower the morning of surgery with the CHG soap  Please wear clean clothes to the hospital/surgery center.   Remember to brush your teeth WITH YOUR REGULAR TOOTHPASTE.   Please read over the following fact sheets that you were given.

## 2018-12-25 NOTE — Progress Notes (Signed)
PCP:  Laverna Peace MD Cardiologist:  denies  EKG:  12/09/18 CXR:  DOS (72 hours) ECHO:  denies Stress Test: 12/22/18 Cardiac Cath: denies  Covid testing 12/26/18  Patient denies shortness of breath, fever, cough, and chest pain at PAT appointment.  Patient verbalized understanding of instructions provided today at the PAT appointment.  Patient asked to review instructions at home and day of surgery.

## 2018-12-26 ENCOUNTER — Other Ambulatory Visit (HOSPITAL_COMMUNITY)
Admission: RE | Admit: 2018-12-26 | Discharge: 2018-12-26 | Disposition: A | Discharge status: Home / Self Care | Payer: Medicare Other | Source: Ambulatory Visit | Attending: Thoracic Surgery (Cardiothoracic Vascular Surgery) | Admitting: Thoracic Surgery (Cardiothoracic Vascular Surgery)

## 2018-12-26 DIAGNOSIS — Z20828 Contact with and (suspected) exposure to other viral communicable diseases: Secondary | ICD-10-CM | POA: Insufficient documentation

## 2018-12-26 DIAGNOSIS — Z01812 Encounter for preprocedural laboratory examination: Secondary | ICD-10-CM | POA: Insufficient documentation

## 2018-12-27 LAB — NOVEL CORONAVIRUS, NAA (HOSP ORDER, SEND-OUT TO REF LAB; TAT 18-24 HRS): SARS-CoV-2, NAA: NOT DETECTED

## 2018-12-28 NOTE — Anesthesia Preprocedure Evaluation (Addendum)
Anesthesia Evaluation  Patient identified by MRN, date of birth, ID band Patient awake    Reviewed: Allergy & Precautions, NPO status , Patient's Chart, lab work & pertinent test results  Airway Mallampati: II  TM Distance: >3 FB Neck ROM: Full    Dental no notable dental hx.    Pulmonary Patient abstained from smoking., former smoker,    Pulmonary exam normal breath sounds clear to auscultation       Cardiovascular hypertension, Pt. on medications Normal cardiovascular exam Rhythm:Regular Rate:Normal  Myocardial Perfusion Nuclear stress EF: 74%. The left ventricular ejection fraction is hyperdynamic (>65%). There was no ST segment deviation noted during stress. This is a low risk study. No evidence of previous infarction or ischemia The study is normal.   Neuro/Psych negative neurological ROS  negative psych ROS   GI/Hepatic negative GI ROS, Neg liver ROS,   Endo/Other  negative endocrine ROS  Renal/GU negative Renal ROS     Musculoskeletal negative musculoskeletal ROS (+)   Abdominal   Peds  Hematology HLD   Anesthesia Other Findings LUL PULMONARY NODULE  Reproductive/Obstetrics                            Anesthesia Physical Anesthesia Plan  ASA: II  Anesthesia Plan: General   Post-op Pain Management:    Induction: Intravenous  PONV Risk Score and Plan: 3 and Ondansetron, Dexamethasone, Midazolam and Treatment may vary due to age or medical condition  Airway Management Planned: Oral ETT and Double Lumen EBT  Additional Equipment: Arterial line  Intra-op Plan:   Post-operative Plan: Extubation in OR  Informed Consent: I have reviewed the patients History and Physical, chart, labs and discussed the procedure including the risks, benefits and alternatives for the proposed anesthesia with the patient or authorized representative who has indicated his/her understanding and  acceptance.     Dental advisory given  Plan Discussed with: CRNA  Anesthesia Plan Comments:        Anesthesia Quick Evaluation

## 2018-12-29 ENCOUNTER — Inpatient Hospital Stay (HOSPITAL_COMMUNITY): Payer: Medicare Other | Admitting: Certified Registered"

## 2018-12-29 ENCOUNTER — Other Ambulatory Visit: Payer: Self-pay

## 2018-12-29 ENCOUNTER — Inpatient Hospital Stay (HOSPITAL_COMMUNITY): Payer: Medicare Other

## 2018-12-29 ENCOUNTER — Encounter (HOSPITAL_COMMUNITY): Payer: Self-pay

## 2018-12-29 ENCOUNTER — Encounter (HOSPITAL_COMMUNITY)
Admission: RE | Disposition: A | Discharge status: Home / Self Care | Payer: Self-pay | Source: Home / Self Care | Attending: Thoracic Surgery (Cardiothoracic Vascular Surgery)

## 2018-12-29 ENCOUNTER — Inpatient Hospital Stay (HOSPITAL_COMMUNITY)
Admission: RE | Admit: 2018-12-29 | Discharge: 2019-01-21 | DRG: 164 | Disposition: A | Discharge status: Home / Self Care | Payer: Medicare Other | Attending: Thoracic Surgery (Cardiothoracic Vascular Surgery) | Admitting: Thoracic Surgery (Cardiothoracic Vascular Surgery)

## 2018-12-29 DIAGNOSIS — Z4682 Encounter for fitting and adjustment of non-vascular catheter: Secondary | ICD-10-CM

## 2018-12-29 DIAGNOSIS — J95812 Postprocedural air leak: Secondary | ICD-10-CM | POA: Diagnosis not present

## 2018-12-29 DIAGNOSIS — R0602 Shortness of breath: Secondary | ICD-10-CM | POA: Diagnosis not present

## 2018-12-29 DIAGNOSIS — I10 Essential (primary) hypertension: Secondary | ICD-10-CM | POA: Diagnosis present

## 2018-12-29 DIAGNOSIS — Z20828 Contact with and (suspected) exposure to other viral communicable diseases: Secondary | ICD-10-CM | POA: Diagnosis present

## 2018-12-29 DIAGNOSIS — Z8619 Personal history of other infectious and parasitic diseases: Secondary | ICD-10-CM | POA: Diagnosis not present

## 2018-12-29 DIAGNOSIS — Z91040 Latex allergy status: Secondary | ICD-10-CM | POA: Diagnosis not present

## 2018-12-29 DIAGNOSIS — J9382 Other air leak: Secondary | ICD-10-CM | POA: Diagnosis not present

## 2018-12-29 DIAGNOSIS — R05 Cough: Secondary | ICD-10-CM | POA: Diagnosis not present

## 2018-12-29 DIAGNOSIS — C3412 Malignant neoplasm of upper lobe, left bronchus or lung: Secondary | ICD-10-CM | POA: Diagnosis not present

## 2018-12-29 DIAGNOSIS — E559 Vitamin D deficiency, unspecified: Secondary | ICD-10-CM | POA: Diagnosis present

## 2018-12-29 DIAGNOSIS — Y92230 Patient room in hospital as the place of occurrence of the external cause: Secondary | ICD-10-CM | POA: Diagnosis not present

## 2018-12-29 DIAGNOSIS — E785 Hyperlipidemia, unspecified: Secondary | ICD-10-CM | POA: Diagnosis not present

## 2018-12-29 DIAGNOSIS — R911 Solitary pulmonary nodule: Secondary | ICD-10-CM | POA: Diagnosis not present

## 2018-12-29 DIAGNOSIS — F17210 Nicotine dependence, cigarettes, uncomplicated: Secondary | ICD-10-CM | POA: Diagnosis present

## 2018-12-29 DIAGNOSIS — R7303 Prediabetes: Secondary | ICD-10-CM | POA: Diagnosis not present

## 2018-12-29 DIAGNOSIS — J939 Pneumothorax, unspecified: Secondary | ICD-10-CM | POA: Diagnosis not present

## 2018-12-29 DIAGNOSIS — T8182XA Emphysema (subcutaneous) resulting from a procedure, initial encounter: Secondary | ICD-10-CM | POA: Diagnosis not present

## 2018-12-29 DIAGNOSIS — F419 Anxiety disorder, unspecified: Secondary | ICD-10-CM | POA: Diagnosis present

## 2018-12-29 DIAGNOSIS — M810 Age-related osteoporosis without current pathological fracture: Secondary | ICD-10-CM | POA: Diagnosis present

## 2018-12-29 DIAGNOSIS — Y838 Other surgical procedures as the cause of abnormal reaction of the patient, or of later complication, without mention of misadventure at the time of the procedure: Secondary | ICD-10-CM | POA: Diagnosis not present

## 2018-12-29 DIAGNOSIS — J9690 Respiratory failure, unspecified, unspecified whether with hypoxia or hypercapnia: Secondary | ICD-10-CM | POA: Diagnosis not present

## 2018-12-29 DIAGNOSIS — Y836 Removal of other organ (partial) (total) as the cause of abnormal reaction of the patient, or of later complication, without mention of misadventure at the time of the procedure: Secondary | ICD-10-CM | POA: Diagnosis not present

## 2018-12-29 DIAGNOSIS — Z886 Allergy status to analgesic agent status: Secondary | ICD-10-CM

## 2018-12-29 DIAGNOSIS — Z79899 Other long term (current) drug therapy: Secondary | ICD-10-CM

## 2018-12-29 DIAGNOSIS — Z9689 Presence of other specified functional implants: Secondary | ICD-10-CM

## 2018-12-29 DIAGNOSIS — Z09 Encounter for follow-up examination after completed treatment for conditions other than malignant neoplasm: Secondary | ICD-10-CM

## 2018-12-29 DIAGNOSIS — J942 Hemothorax: Secondary | ICD-10-CM | POA: Diagnosis not present

## 2018-12-29 DIAGNOSIS — J439 Emphysema, unspecified: Secondary | ICD-10-CM | POA: Diagnosis not present

## 2018-12-29 HISTORY — PX: VIDEO ASSISTED THORACOSCOPY (VATS)/ LOBECTOMY: SHX6169

## 2018-12-29 HISTORY — PX: VIDEO BRONCHOSCOPY: SHX5072

## 2018-12-29 LAB — GLUCOSE, CAPILLARY
Glucose-Capillary: 108 mg/dL — ABNORMAL HIGH (ref 70–99)
Glucose-Capillary: 126 mg/dL — ABNORMAL HIGH (ref 70–99)
Glucose-Capillary: 165 mg/dL — ABNORMAL HIGH (ref 70–99)
Glucose-Capillary: 207 mg/dL — ABNORMAL HIGH (ref 70–99)
Glucose-Capillary: 83 mg/dL (ref 70–99)

## 2018-12-29 LAB — PREPARE RBC (CROSSMATCH)

## 2018-12-29 SURGERY — BRONCHOSCOPY, VIDEO-ASSISTED
Anesthesia: General | Site: Chest

## 2018-12-29 MED ORDER — LABETALOL HCL 5 MG/ML IV SOLN
INTRAVENOUS | Status: DC | PRN
  Administered 2018-12-29: 5 mg via INTRAVENOUS

## 2018-12-29 MED ORDER — FENTANYL CITRATE (PF) 250 MCG/5ML IJ SOLN
INTRAMUSCULAR | Status: AC
  Filled 2018-12-29: qty 5

## 2018-12-29 MED ORDER — PHENYLEPHRINE HCL (PRESSORS) 10 MG/ML IV SOLN
INTRAVENOUS | Status: DC | PRN
  Administered 2018-12-29: 120 ug via INTRAVENOUS

## 2018-12-29 MED ORDER — SODIUM CHLORIDE (PF) 0.9 % IJ SOLN
INTRAMUSCULAR | Status: DC | PRN
  Administered 2018-12-29: 50 mL via INTRAVENOUS

## 2018-12-29 MED ORDER — BISACODYL 5 MG PO TBEC
10.0000 mg | DELAYED_RELEASE_TABLET | Freq: Every day | ORAL | Status: DC
  Administered 2018-12-31 – 2019-01-03 (×2): 10 mg via ORAL
  Filled 2018-12-29 (×5): qty 2

## 2018-12-29 MED ORDER — BUPIVACAINE HCL 0.5 % IJ SOLN
INTRAMUSCULAR | Status: AC
  Filled 2018-12-29: qty 1

## 2018-12-29 MED ORDER — LACTATED RINGERS IV SOLN
INTRAVENOUS | Status: DC | PRN
  Administered 2018-12-29 (×2): via INTRAVENOUS

## 2018-12-29 MED ORDER — ONDANSETRON HCL 4 MG/2ML IJ SOLN: 4.0000 mg | Freq: Once | INTRAMUSCULAR | Status: DC | PRN

## 2018-12-29 MED ORDER — SUGAMMADEX SODIUM 200 MG/2ML IV SOLN
INTRAVENOUS | Status: DC | PRN
  Administered 2018-12-29: 200 mg via INTRAVENOUS

## 2018-12-29 MED ORDER — EPHEDRINE SULFATE 50 MG/ML IJ SOLN
INTRAMUSCULAR | Status: DC | PRN
  Administered 2018-12-29 (×3): 10 mg via INTRAVENOUS

## 2018-12-29 MED ORDER — FENTANYL CITRATE (PF) 100 MCG/2ML IJ SOLN: 25.0000 ug | INTRAMUSCULAR | Status: DC | PRN

## 2018-12-29 MED ORDER — MUPIROCIN 2 % EX OINT
TOPICAL_OINTMENT | CUTANEOUS | Status: AC
  Filled 2018-12-29: qty 22

## 2018-12-29 MED ORDER — GLYCOPYRROLATE 0.2 MG/ML IJ SOLN
INTRAMUSCULAR | Status: DC | PRN
  Administered 2018-12-29: 0.1 mg via INTRAVENOUS

## 2018-12-29 MED ORDER — LIDOCAINE 2% (20 MG/ML) 5 ML SYRINGE
INTRAMUSCULAR | Status: AC
  Filled 2018-12-29: qty 5

## 2018-12-29 MED ORDER — LIDOCAINE HCL (CARDIAC) PF 100 MG/5ML IV SOSY
PREFILLED_SYRINGE | INTRAVENOUS | Status: DC | PRN
  Administered 2018-12-29: 60 mg via INTRAVENOUS

## 2018-12-29 MED ORDER — ACETAMINOPHEN 160 MG/5ML PO SOLN: 1000.0000 mg | Freq: Four times a day (QID) | ORAL | Status: AC

## 2018-12-29 MED ORDER — CEFAZOLIN SODIUM-DEXTROSE 2-4 GM/100ML-% IV SOLN
2.0000 g | Freq: Three times a day (TID) | INTRAVENOUS | Status: AC
  Administered 2018-12-29 – 2018-12-30 (×2): 2 g via INTRAVENOUS
  Filled 2018-12-29 (×2): qty 100

## 2018-12-29 MED ORDER — LISINOPRIL 20 MG PO TABS: 30.0000 mg | ORAL_TABLET | Freq: Every day | ORAL | Status: DC

## 2018-12-29 MED ORDER — DEXAMETHASONE SODIUM PHOSPHATE 10 MG/ML IJ SOLN
INTRAMUSCULAR | Status: AC
  Filled 2018-12-29: qty 1

## 2018-12-29 MED ORDER — ONDANSETRON HCL 4 MG/2ML IJ SOLN
INTRAMUSCULAR | Status: DC | PRN
  Administered 2018-12-29: 4 mg via INTRAVENOUS

## 2018-12-29 MED ORDER — LABETALOL HCL 5 MG/ML IV SOLN
INTRAVENOUS | Status: AC
  Filled 2018-12-29: qty 4

## 2018-12-29 MED ORDER — INSULIN ASPART 100 UNIT/ML ~~LOC~~ SOLN
0.0000 [IU] | SUBCUTANEOUS | Status: DC
  Administered 2018-12-29: 21:00:00 4 [IU] via SUBCUTANEOUS
  Administered 2018-12-29: 8 [IU] via SUBCUTANEOUS
  Administered 2018-12-30: 21:00:00 2 [IU] via SUBCUTANEOUS

## 2018-12-29 MED ORDER — MORPHINE SULFATE (PF) 2 MG/ML IV SOLN: 1.0000 mg | INTRAVENOUS | Status: DC | PRN

## 2018-12-29 MED ORDER — MIDAZOLAM HCL 2 MG/2ML IJ SOLN
INTRAMUSCULAR | Status: AC
  Filled 2018-12-29: qty 2

## 2018-12-29 MED ORDER — CEFAZOLIN SODIUM-DEXTROSE 2-4 GM/100ML-% IV SOLN
INTRAVENOUS | Status: AC
  Filled 2018-12-29: qty 100

## 2018-12-29 MED ORDER — ROCURONIUM BROMIDE 10 MG/ML (PF) SYRINGE
PREFILLED_SYRINGE | INTRAVENOUS | Status: AC
  Filled 2018-12-29: qty 30

## 2018-12-29 MED ORDER — PHENYLEPHRINE HCL-NACL 10-0.9 MG/250ML-% IV SOLN
INTRAVENOUS | Status: DC | PRN
  Administered 2018-12-29: 50 ug/min via INTRAVENOUS

## 2018-12-29 MED ORDER — SODIUM CHLORIDE 0.9 % IV SOLN: INTRAVENOUS | Status: DC | PRN

## 2018-12-29 MED ORDER — ACETAMINOPHEN 500 MG PO TABS
1000.0000 mg | ORAL_TABLET | Freq: Once | ORAL | Status: AC
  Administered 2018-12-29: 06:00:00 1000 mg via ORAL

## 2018-12-29 MED ORDER — ACETAMINOPHEN 500 MG PO TABS
1000.0000 mg | ORAL_TABLET | Freq: Four times a day (QID) | ORAL | Status: AC
  Administered 2018-12-29 – 2019-01-03 (×19): 1000 mg via ORAL
  Filled 2018-12-29 (×20): qty 2

## 2018-12-29 MED ORDER — BUPIVACAINE LIPOSOME 1.3 % IJ SUSP
20.0000 mL | INTRAMUSCULAR | Status: AC
  Administered 2018-12-29: 20 mL
  Filled 2018-12-29: qty 20

## 2018-12-29 MED ORDER — ATORVASTATIN CALCIUM 40 MG PO TABS
40.0000 mg | ORAL_TABLET | Freq: Every day | ORAL | Status: DC
  Administered 2018-12-30 – 2019-01-21 (×21): 40 mg via ORAL
  Filled 2018-12-29 (×22): qty 1

## 2018-12-29 MED ORDER — SENNOSIDES-DOCUSATE SODIUM 8.6-50 MG PO TABS
1.0000 | ORAL_TABLET | Freq: Every day | ORAL | Status: DC
  Administered 2018-12-30 – 2018-12-31 (×2): 1 via ORAL
  Filled 2018-12-29 (×9): qty 1

## 2018-12-29 MED ORDER — ONDANSETRON HCL 4 MG/2ML IJ SOLN
INTRAMUSCULAR | Status: AC
  Filled 2018-12-29: qty 2

## 2018-12-29 MED ORDER — CEFAZOLIN SODIUM-DEXTROSE 2-4 GM/100ML-% IV SOLN
2.0000 g | INTRAVENOUS | Status: AC
  Administered 2018-12-29: 2 g via INTRAVENOUS

## 2018-12-29 MED ORDER — PROPOFOL 10 MG/ML IV BOLUS
INTRAVENOUS | Status: AC
  Filled 2018-12-29: qty 40

## 2018-12-29 MED ORDER — FENTANYL CITRATE (PF) 250 MCG/5ML IJ SOLN
INTRAMUSCULAR | Status: DC | PRN
  Administered 2018-12-29: 100 ug via INTRAVENOUS
  Administered 2018-12-29: 25 ug via INTRAVENOUS
  Administered 2018-12-29: 50 ug via INTRAVENOUS
  Administered 2018-12-29: 25 ug via INTRAVENOUS
  Administered 2018-12-29: 100 ug via INTRAVENOUS
  Administered 2018-12-29: 150 ug via INTRAVENOUS
  Administered 2018-12-29: 50 ug via INTRAVENOUS

## 2018-12-29 MED ORDER — TRAMADOL HCL 50 MG PO TABS: 50.0000 mg | ORAL_TABLET | Freq: Four times a day (QID) | ORAL | Status: DC | PRN

## 2018-12-29 MED ORDER — ROCURONIUM BROMIDE 100 MG/10ML IV SOLN
INTRAVENOUS | Status: DC | PRN
  Administered 2018-12-29: 20 mg via INTRAVENOUS
  Administered 2018-12-29: 50 mg via INTRAVENOUS
  Administered 2018-12-29: 20 mg via INTRAVENOUS

## 2018-12-29 MED ORDER — ONDANSETRON HCL 4 MG/2ML IJ SOLN: 4.0000 mg | Freq: Four times a day (QID) | INTRAMUSCULAR | Status: DC | PRN

## 2018-12-29 MED ORDER — PROPOFOL 10 MG/ML IV BOLUS
INTRAVENOUS | Status: DC | PRN
  Administered 2018-12-29: 100 mg via INTRAVENOUS
  Administered 2018-12-29 (×2): 50 mg via INTRAVENOUS

## 2018-12-29 MED ORDER — ROCURONIUM BROMIDE 10 MG/ML (PF) SYRINGE
PREFILLED_SYRINGE | INTRAVENOUS | Status: AC
  Filled 2018-12-29: qty 10

## 2018-12-29 MED ORDER — DEXAMETHASONE SODIUM PHOSPHATE 10 MG/ML IJ SOLN
INTRAMUSCULAR | Status: DC | PRN
  Administered 2018-12-29: 10 mg via INTRAVENOUS

## 2018-12-29 MED ORDER — BUPIVACAINE HCL (PF) 0.5 % IJ SOLN
INTRAMUSCULAR | Status: AC
  Filled 2018-12-29: qty 30

## 2018-12-29 MED ORDER — ACETAMINOPHEN 500 MG PO TABS
ORAL_TABLET | ORAL | Status: AC
  Administered 2018-12-29: 1000 mg via ORAL
  Filled 2018-12-29: qty 2

## 2018-12-29 MED ORDER — LACTATED RINGERS IV SOLN
INTRAVENOUS | Status: DC | PRN
  Administered 2018-12-29: 07:00:00 via INTRAVENOUS

## 2018-12-29 MED ORDER — TRAMADOL HCL 50 MG PO TABS
50.0000 mg | ORAL_TABLET | Freq: Four times a day (QID) | ORAL | Status: DC | PRN
  Administered 2018-12-31: 100 mg via ORAL
  Administered 2018-12-31 (×2): 50 mg via ORAL
  Administered 2018-12-31: 04:00:00 100 mg via ORAL
  Administered 2019-01-01: 11:00:00 50 mg via ORAL
  Administered 2019-01-01 – 2019-01-02 (×2): 100 mg via ORAL
  Administered 2019-01-04 – 2019-01-07 (×3): 50 mg via ORAL
  Administered 2019-01-07: 23:00:00 100 mg via ORAL
  Filled 2018-12-29 (×4): qty 1
  Filled 2018-12-29 (×3): qty 2
  Filled 2018-12-29: qty 1
  Filled 2018-12-29: qty 2
  Filled 2018-12-29: qty 1
  Filled 2018-12-29: qty 2

## 2018-12-29 MED ORDER — 0.9 % SODIUM CHLORIDE (POUR BTL) OPTIME
TOPICAL | Status: DC | PRN
  Administered 2018-12-29: 1000 mL

## 2018-12-29 MED ORDER — BUPIVACAINE HCL 0.5 % IJ SOLN
INTRAMUSCULAR | Status: DC | PRN
  Administered 2018-12-29: 30 mL

## 2018-12-29 SURGICAL SUPPLY — 104 items
APPLIER CLIP ROT 10 11.4 M/L (STAPLE)
BLADE CLIPPER SURG (BLADE) ×4 IMPLANT
CANISTER SUCT 3000ML PPV (MISCELLANEOUS) ×4 IMPLANT
CATH THORACIC 28FR (CATHETERS) IMPLANT
CATH THORACIC 28FR RT ANG (CATHETERS) IMPLANT
CATH THORACIC 36FR (CATHETERS) IMPLANT
CATH THORACIC 36FR RT ANG (CATHETERS) IMPLANT
CATH TROCAR 20FR (CATHETERS) IMPLANT
CLIP APPLIE ROT 10 11.4 M/L (STAPLE) IMPLANT
CLIP VESOCCLUDE MED 6/CT (CLIP) ×4 IMPLANT
CONN ST 1/4X3/8  BEN (MISCELLANEOUS)
CONN ST 1/4X3/8 BEN (MISCELLANEOUS) IMPLANT
CONN Y 3/8X3/8X3/8  BEN (MISCELLANEOUS)
CONN Y 3/8X3/8X3/8 BEN (MISCELLANEOUS) IMPLANT
CONT SPEC 4OZ CLIKSEAL STRL BL (MISCELLANEOUS) ×40 IMPLANT
COVER SURGICAL LIGHT HANDLE (MISCELLANEOUS) IMPLANT
COVER WAND RF STERILE (DRAPES) ×4 IMPLANT
DEFOGGER SCOPE WARMER CLEARIFY (MISCELLANEOUS) ×8 IMPLANT
DERMABOND ADHESIVE PROPEN (GAUZE/BANDAGES/DRESSINGS) ×2
DERMABOND ADVANCED .7 DNX6 (GAUZE/BANDAGES/DRESSINGS) ×2 IMPLANT
DISSECTOR BLUNT TIP ENDO 5MM (MISCELLANEOUS) IMPLANT
DRAIN CHANNEL 28F RND 3/8 FF (WOUND CARE) IMPLANT
DRAIN CHANNEL 32F RND 10.7 FF (WOUND CARE) IMPLANT
DRAPE WARM FLUID 44X44 (DRAPES) ×4 IMPLANT
ELECT BLADE 6.5 EXT (BLADE) ×4 IMPLANT
ELECT REM PT RETURN 9FT ADLT (ELECTROSURGICAL) ×4
ELECTRODE REM PT RTRN 9FT ADLT (ELECTROSURGICAL) ×2 IMPLANT
GAUZE SPONGE 4X4 12PLY STRL (GAUZE/BANDAGES/DRESSINGS) ×4 IMPLANT
GAUZE SPONGE 4X4 12PLY STRL LF (GAUZE/BANDAGES/DRESSINGS) ×4 IMPLANT
GLOVE BIO SURGEON STRL SZ7 (GLOVE) ×8 IMPLANT
GLOVE BIOGEL PI IND STRL 6 (GLOVE) ×4 IMPLANT
GLOVE BIOGEL PI IND STRL 6.5 (GLOVE) ×2 IMPLANT
GLOVE BIOGEL PI INDICATOR 6 (GLOVE) ×4
GLOVE BIOGEL PI INDICATOR 6.5 (GLOVE) ×2
GLOVE SURG SS PI 6.0 STRL IVOR (GLOVE) ×4 IMPLANT
GLOVE SURG SS PI 7.5 STRL IVOR (GLOVE) ×4 IMPLANT
GLOVE SURG SS PI 8.0 STRL IVOR (GLOVE) ×4 IMPLANT
GOWN STRL REUS W/ TWL LRG LVL3 (GOWN DISPOSABLE) ×4 IMPLANT
GOWN STRL REUS W/ TWL XL LVL3 (GOWN DISPOSABLE) ×2 IMPLANT
GOWN STRL REUS W/TWL LRG LVL3 (GOWN DISPOSABLE) ×4
GOWN STRL REUS W/TWL XL LVL3 (GOWN DISPOSABLE) ×2
HANDLE STAPLE ENDO GIA SHORT (STAPLE) ×2
HEMOSTAT SURGICEL 2X14 (HEMOSTASIS) IMPLANT
KIT BASIN OR (CUSTOM PROCEDURE TRAY) ×4 IMPLANT
KIT SUCTION CATH 14FR (SUCTIONS) IMPLANT
KIT TURNOVER KIT B (KITS) ×4 IMPLANT
NEEDLE 22X1 1/2 (OR ONLY) (NEEDLE) ×4 IMPLANT
NEEDLE SPNL 18GX3.5 QUINCKE PK (NEEDLE) IMPLANT
NS IRRIG 1000ML POUR BTL (IV SOLUTION) ×12 IMPLANT
PACK CHEST (CUSTOM PROCEDURE TRAY) ×4 IMPLANT
PACK UNIVERSAL I (CUSTOM PROCEDURE TRAY) ×4 IMPLANT
PAD ARMBOARD 7.5X6 YLW CONV (MISCELLANEOUS) ×8 IMPLANT
POUCH ENDO CATCH II 15MM (MISCELLANEOUS) ×4 IMPLANT
POUCH SPECIMEN RETRIEVAL 10MM (ENDOMECHANICALS) IMPLANT
RELOAD EGIA 45 MED/THCK PURPLE (STAPLE) ×32 IMPLANT
RELOAD EGIA 45 TAN VASC (STAPLE) ×16 IMPLANT
RELOAD TRI 2.0 30 VAS MED SUL (STAPLE) ×4 IMPLANT
RETRACTOR WOUND ALXS 19CM XSML (INSTRUMENTS) IMPLANT
RTRCTR WOUND ALEXIS 19CM XSML (INSTRUMENTS)
SCISSORS LAP 5X35 DISP (ENDOMECHANICALS) IMPLANT
SEALANT PROGEL (MISCELLANEOUS) IMPLANT
SEALANT SURG COSEAL 4ML (VASCULAR PRODUCTS) IMPLANT
SEALANT SURG COSEAL 8ML (VASCULAR PRODUCTS) IMPLANT
SEALER LIGASURE MARYLAND 30 (ELECTROSURGICAL) ×8 IMPLANT
SET IRRIG TUBING LAPAROSCOPIC (IRRIGATION / IRRIGATOR) IMPLANT
SOL ANTI FOG 6CC (MISCELLANEOUS) ×2 IMPLANT
SOLUTION ANTI FOG 6CC (MISCELLANEOUS) ×2
SPECIMEN JAR MEDIUM (MISCELLANEOUS) IMPLANT
SPONGE INTESTINAL PEANUT (DISPOSABLE) ×8 IMPLANT
SPONGE TONSIL TAPE 1 RFD (DISPOSABLE) ×4 IMPLANT
STAPLER ENDO GIA 12MM SHORT (STAPLE) ×6 IMPLANT
STAPLER VISISTAT 35W (STAPLE) ×4 IMPLANT
STOPCOCK 4 WAY LG BORE MALE ST (IV SETS) ×4 IMPLANT
SUT MNCRL AB 3-0 PS2 18 (SUTURE) IMPLANT
SUT MON AB 2-0 CT1 36 (SUTURE) IMPLANT
SUT PDS AB 1 CTX 36 (SUTURE) IMPLANT
SUT PROLENE 4 0 RB 1 (SUTURE)
SUT PROLENE 4-0 RB1 .5 CRCL 36 (SUTURE) IMPLANT
SUT SILK  1 MH (SUTURE)
SUT SILK 1 MH (SUTURE) IMPLANT
SUT SILK 1 TIES 10X30 (SUTURE) ×4 IMPLANT
SUT SILK 2 0 SH (SUTURE) IMPLANT
SUT SILK 2 0SH CR/8 30 (SUTURE) IMPLANT
SUT VIC AB 1 CTX 36 (SUTURE)
SUT VIC AB 1 CTX36XBRD ANBCTR (SUTURE) IMPLANT
SUT VIC AB 2-0 CT1 27 (SUTURE) ×4
SUT VIC AB 2-0 CT1 TAPERPNT 27 (SUTURE) ×4 IMPLANT
SUT VIC AB 3-0 SH 27 (SUTURE) ×2
SUT VIC AB 3-0 SH 27X BRD (SUTURE) ×2 IMPLANT
SUT VICRYL 0 UR6 27IN ABS (SUTURE) ×4 IMPLANT
SUT VICRYL 2 TP 1 (SUTURE) IMPLANT
SYR 10ML LL (SYRINGE) ×4 IMPLANT
SYR 30ML LL (SYRINGE) ×4 IMPLANT
SYR 50ML LL SCALE MARK (SYRINGE) ×8 IMPLANT
SYSTEM SAHARA CHEST DRAIN ATS (WOUND CARE) ×4 IMPLANT
TAPE CLOTH 4X10 WHT NS (GAUZE/BANDAGES/DRESSINGS) ×4 IMPLANT
TAPE PAPER 2X10 WHT MICROPORE (GAUZE/BANDAGES/DRESSINGS) ×4 IMPLANT
TIP APPLICATOR SPRAY EXTEND 16 (VASCULAR PRODUCTS) IMPLANT
TOWEL GREEN STERILE (TOWEL DISPOSABLE) ×4 IMPLANT
TOWEL GREEN STERILE FF (TOWEL DISPOSABLE) ×4 IMPLANT
TRAY FOLEY MTR SLVR 16FR STAT (SET/KITS/TRAYS/PACK) ×4 IMPLANT
TROCAR XCEL BLADELESS 5X75MML (TROCAR) ×4 IMPLANT
TUBING EXTENTION W/L.L. (IV SETS) ×4 IMPLANT
WATER STERILE IRR 1000ML POUR (IV SOLUTION) ×4 IMPLANT

## 2018-12-29 NOTE — Progress Notes (Signed)
Report to Orie Fisherman RN as primary caregiver

## 2018-12-29 NOTE — Plan of Care (Signed)
  Problem: Clinical Measurements: Goal: Postoperative complications will be avoided or minimized Outcome: Progressing   Problem: Pain Management: Goal: Pain level will decrease Outcome: Progressing   Problem: Skin Integrity: Goal: Wound healing without signs and symptoms infection will improve Outcome: Progressing   Problem: Clinical Measurements: Goal: Respiratory complications will improve Outcome: Progressing   Problem: Coping: Goal: Level of anxiety will decrease Outcome: Progressing   Problem: Safety: Goal: Ability to remain free from injury will improve Outcome: Progressing   Problem: Pain Managment: Goal: General experience of comfort will improve Outcome: Progressing

## 2018-12-29 NOTE — Anesthesia Procedure Notes (Addendum)
Procedure Name: Intubation Date/Time: 12/29/2018 7:51 AM Performed by: Griffin Dakin, CRNA Pre-anesthesia Checklist: Patient identified, Emergency Drugs available, Suction available and Patient being monitored Patient Re-evaluated:Patient Re-evaluated prior to induction Oxygen Delivery Method: Circle system utilized Preoxygenation: Pre-oxygenation with 100% oxygen Induction Type: IV induction Ventilation: Mask ventilation without difficulty Laryngoscope Size: Mac and 3 Grade View: Grade I Tube type: Oral Endobronchial tube: Left and 37 Fr Number of attempts: 1 Airway Equipment and Method: Video-laryngoscopy and Rigid stylet Placement Confirmation: ETT inserted through vocal cords under direct vision,  positive ETCO2 and breath sounds checked- equal and bilateral Secured at: 37 cm Tube secured with: Tape Dental Injury: Teeth and Oropharynx as per pre-operative assessment

## 2018-12-29 NOTE — Brief Op Note (Signed)
12/29/2018  10:49 AM  PATIENT:  Jennifer [name]  73 y.o. female  PRE-OPERATIVE DIAGNOSIS:  LUL PULMONARY NODULE  POST-OPERATIVE DIAGNOSIS:  LUL PULMONARY NODULE  PROCEDURE:  Procedure(s): VIDEO BRONCHOSCOPY (N/A) VIDEO ASSISTED THORACOSCOPY (VATS)/LEFT UPPER  LOBECTOMY (Left)  SURGEON:  Surgeon(s) and Role:    * Lightfoot, Lucile Crater, MD - Primary  PHYSICIAN ASSISTANT:   Nicholes Rough, PA-C   ANESTHESIA:   general  EBL:  275 mL   BLOOD ADMINISTERED:none  DRAINS: ROUTINE   LOCAL MEDICATIONS USED:  BUPIVICAINE   SPECIMEN:  Source of Specimen:  LEFT UPPER LOBE,  NODES  DISPOSITION OF SPECIMEN:  PATHOLOGY  COUNTS:  YES  DICTATION: .Dragon Dictation  PLAN OF CARE: Admit to inpatient   PATIENT DISPOSITION:  PACU - hemodynamically stable.   Delay start of Pharmacological VTE agent (>24hrs) due to surgical blood loss or risk of bleeding: yes

## 2018-12-29 NOTE — H&P (Signed)
[address] [place] [address] [phone]  No changes since her clinic appointment.  She has been smoke free since 10/10  Solitary left upper lobe pulmonary nodule OR today for bronchoscopy, Left VATS, left upper lobectomy   Per my last note [place] Record #[id number] Date of Birth:[birth year]  Referring:[place], Higinio Roger, * Primary Care:Wendling, Crosby Oyster, DO Primary Cardiologist:No primary care provider on file.  Chief Complaint:     Chief Complaint  Patient presents with   Lung Mass    Surgical consult    History of Present Illness: Jennifer [name] y.o.femalepresents for surgical evaluation of a 1.9cm left upper lobe pulmonary nodule. She was seen by a new PCP, and due to her smoking Hx, and CT chest was ordered which identified this nodule. She denies any neurologic symptoms, and her weight has been stable. She has a chronic cough, but does not have any exertional dyspnea. She continues to smoke, and this has been worst with her anxiety   Smoking Hx: Ppd since close to 76yrs   Zubrod Score: At the time of surgery this patients most appropriate activity status/level should be described as: [x] ??0 Normal activity, no symptoms [] ??1 Restricted in physical strenuous activity but ambulatory, able to do out light work [] ??2 Ambulatory and capable of self care, unable to do work activities, up and about >50 % of waking hours  [] ??3 Only limited self care, in bed greater than 50% of waking hours [] ??4 Completely disabled, no self care, confined to bed or chair [] ??5 Moribund       Past Medical History:  Diagnosis Date   History of chicken pox    Hyperlipidemia    Hypertension    Menopause    Osteoporosis     Pre-diabetes    Vitamin D deficiency     No past surgical history on file.  No family history on file.   Social History       Tobacco Use  Smoking Status Current Every Day Smoker   Packs/day: 0.50   Types: Cigarettes  Smokeless Tobacco Never Used   Social History      Substance and Sexual Activity  Alcohol Use No         Allergies  Allergen Reactions   Aspirin Hives   Latex Rash          Current Outpatient Medications  Medication Sig Dispense Refill   lisinopril (PRINIVIL,ZESTRIL) 10 MG tablet TAKE 1 TABLET (10 MG TOTAL) BY MOUTH DAILY. (Patient taking differently: 30 mg daily. 30 mg daily) 90 tablet 1   alendronate (FOSAMAX) 10 MG tablet Take 10 mg by mouth daily.     atorvastatin (LIPITOR) 40 MG tablet Take 40 mg by mouth daily.     No current facility-administered medications for this visit.    Review of Systems  Constitutional:Negative.  HENT:Negative.  Eyes:Negative.  Respiratory: Positive forcoughand sputum production. Negative forshortness of breath.  Cardiovascular:Negative.  Gastrointestinal:Negative.  Genitourinary:Negative.  Musculoskeletal:Negative.  Skin:Negative.  Neurological:Negative.  Psychiatric/Behavioral: Positive fordepression. The patientis nervous/anxious.    PHYSICAL EXAMINATION: BP (!) 188/72 (BP Location: Right Arm)   Pulse 69   Temp 97.9 F (36.6 C) (Skin)   Resp 20   Ht 5\' 9"  (1.753 m)   Wt 155 lb 9.6 oz (70.6 kg)   SpO2 97% Comment: ra   BMI 22.98 kg/m  Physical Exam Constitutional: She isoriented to person, place, and time. She appearswell-developedand well-nourished.No distress.  HENT:  Head:Normocephalicand atraumatic.  Eyes:Conjunctivaeand EOMare normal.  Neck:Normal range of motion.Neck supple.No tracheal deviationpresent.  Cardiovascular:Normal rate,regular rhythmand normal heart sounds. No  murmurheard. Respiratory:Effort normaland breath sounds normal. Norespiratory distress. She hasno wheezes.  VQ:MGQQ. She exhibitsno distension.  Musculoskeletal:Normal range of motion.  General: No edema.  Neurological: She isalertand oriented to person, place, and time.  Skin: Skin iswarmand dry. She isnot diaphoretic.  Psychiatric: She has anormal mood and affect.   Diagnostic Studies & Laboratory data:  Recent Radiology Findings:   Imaging Results  Nm Pet Image Initial (pi) Skull Base To Thigh  Result Date: 12/01/2018 CLINICAL DATA: Subsequent treatment strategy for pulmonary nodule. EXAM: NUCLEAR MEDICINE PET SKULL BASE TO THIGH TECHNIQUE: 7.27 mCi F-18 FDG was injected intravenously. Full-ring PET imaging was performed from the skull base to thigh after the radiotracer. CT data was obtained and used for attenuation correction and anatomic localization. Fasting blood glucose: 116 mg/dl COMPARISON: None available FINDINGS: Mediastinal blood pool activity: SUV max 2.1 Liver activity: SUV max 3.3 NECK: No hypermetabolic lymph nodes in the neck. Incidental CT findings: none CHEST: Within the LEFT upper lobe rounded nodule with spiculated margin measures 1.8 x 1.9 cm and has moderate metabolic activity with SUV max equal 4.2. No additional suspicious pulmonary nodules. No enlarged or hypermetabolic mediastinal lymph nodes. Incidental CT findings: none ABDOMEN/PELVIS: No abnormal hypermetabolic activity within the liver, pancreas, adrenal glands, or spleen. No hypermetabolic lymph nodes in the abdomen or pelvis. Enlargement of the LEFT adrenal gland has low attenuation consistent benign adenoma. Incidental CT findings: Atherosclerotic calcification of the aorta. Uterus and ovaries normal. SKELETON: No focal hypermetabolic activity to suggest skeletal metastasis. Incidental CT findings: none IMPRESSION: 1. Hypermetabolic LEFT upper lobe pulmonary nodule most consistent  with primary bronchogenic carcinoma. No hypermetabolic mediastinal lymphadenopathy. No distant metastatic disease. PET-CT staging T1b N0 M0. 2. Benign LEFT adrenal adenoma. 3. Aortic Atherosclerosis (ICD10-I70.0). Electronically Signed By: Suzy Bouchard M.D. On: 12/01/2018 12:28       I have independently reviewed the above radiology studies and reviewed the findings with the patient.   Recent Lab Findings: Recent Labs       Lab Results  Component Value Date   WBC 10.5 02/23/2011   HGB 15.2 02/23/2011   PLT 136 (A) 02/23/2011   CHOL 209 (A) 10/28/2009   TRIG 220 (A) 10/28/2009   HDL 41 10/28/2009   LDLCALC 124 10/28/2009   CREATININE 0.8 10/28/2009       PFTs: - FVC:97% - FEV1:82% -DLCO:86%  Problem List: 1.9cm LUL pulmonary nodule with SUV of 4.2 Left adrenal adenoma  Assessment / Plan: 73 yo female with a 1.9cm LUL pulmonary nodule concerning for primary lung cancer. She continues to smoke, thus has been counseled on cessation. She would like to proceed with a navigational bronchoscopy, and biopsy, which has been scheduled for Oct 12 or 13. In that time she will plan to stop smoking. If this is a primary lung cancer, she would like to proceed with surgical resection over SBRT, and understands the importance of smoking cessation.    Jennifer [name]

## 2018-12-29 NOTE — Op Note (Signed)
      [address]       [place] [address]             [phone]        12/29/2018  Patient:  Deon Pilling Pre-Op Dx: left upper lobe pulmonary nodule   Post-op Dx:  same Procedure: - Bronchoscopy - Left Video assisted thoracoscopy - left upper lobectomy - Mediastinal lymph node sampling - Intercostal nerve block  Surgeon and Role:      * Ciana Simmon, Lucile Crater, MD - Primary    * T. Harriet Pho, PA-C - assisting   Anesthesia  general EBL:  275 ml Blood Administration: none  Specimen:  Left upper lobectomy.  Station 5, 6, 9, 11, 12 lymph nodes  Drains: 28 F argyle chest tube in left chest Counts: correct   Indications: 73 year old female was evaluated in clinic for a left upper lobe pulmonary nodule that was PET avid.  She underwent a navigational bronchoscopy that showed that the nodule was TTF1 positive and thus concerning for NSCLC.    Findings: Normal bronchoscopy.  Incomplete fissure.    Operative Technique: After the risks, benefits and alternatives were thoroughly discussed, the patient was brought to the operative theatre.  Anesthesia was induced, and the bronchoscope was passed through the endotracheal tube.  All segmental bronchi were visualized.  The endotracheal tube was then exchanged for a double lumen tube.  The patient was then placed in a right lateral decubitus position and was prepped and draped in normal sterile fashion.  An appropriate surgical pause was performed, and pre-operative antibiotics were dosed accordingly.  We began with 3cm incision in the anterior axillary line at the 8th intercostal space.  The chest was entered, and we then placed a 1cm incision at the 10th intercostal space, and introduced our camera port.  The lung was directly visualized.  There was an apical adhesion that was lysed.  We then made a 4cm incision in the 2nd intercostal space, and entered the chest under direct visualization.  The lung was then retracted  superiorly, and the inferior pulmonary ligament was divided.  The hilum was mobilized anteriorly and posteriorly.  We identified the upper lobe vein, and after careful isolation, it was divided with an endo- GIA stapler.  We next moved to the  Truncus anterior.  The artery was then divided with an endo-GIA stapler.  We then moved to the fissure, and isolated the lingular branches prior to dividing them.  The bronchus to the upper lobe was then isolated.  After a test clamp, with good ventilation of the lower, the bronchus was then divided.  The fissure was completed, and the specimen was passed into an endocatch bag.  It was removed from the superior access site.    Lymph nodes were then sampled at levels 5,6,9,11, and 12.  The chest was irrigated, and an air leak test was performed.  An intercostal nerve block was performed under direct visualization.  A 10F chest with then placed, and we watch the remaining lobes re-expand.  The skin and soft tissue were closed with absorbable suture    The patient tolerated the procedure without any immediate complications, and was transferred to the PACU in stable condition.  Delesha Pohlman Bary Leriche

## 2018-12-29 NOTE — Transfer of Care (Signed)
Immediate Anesthesia Transfer of Care Note  Patient: Jennifer [name]  Procedure(s) Performed: VIDEO BRONCHOSCOPY (N/A ) VIDEO ASSISTED THORACOSCOPY (VATS)/LEFT UPPER  LOBECTOMY with Mediastinal lymph node exploration. (Left Chest)  Patient Location: PACU  Anesthesia Type:General  Level of Consciousness: sedated  Airway & Oxygen Therapy: Patient Spontanous Breathing and Patient connected to face mask oxygen  Post-op Assessment: Report given to RN and Post -op Vital signs reviewed and stable  Post vital signs: Reviewed and stable  Last Vitals:  Vitals Value Taken Time  BP 117/66 12/29/18 1130  Temp    Pulse 62 12/29/18 1134  Resp 7 12/29/18 1130  SpO2 100 % 12/29/18 1134  Vitals shown include unvalidated device data.  Last Pain:  Vitals:   12/29/18 0558  PainSc: 0-No pain         Complications: No apparent anesthesia complications

## 2018-12-29 NOTE — Anesthesia Postprocedure Evaluation (Signed)
Anesthesia Post Note  Patient: Yanai Hobson  Procedure(s) Performed: VIDEO BRONCHOSCOPY (N/A ) VIDEO ASSISTED THORACOSCOPY (VATS)/LEFT UPPER  LOBECTOMY with Mediastinal lymph node exploration. (Left Chest)     Patient location during evaluation: PACU Anesthesia Type: General Level of consciousness: awake Pain management: pain level controlled Vital Signs Assessment: post-procedure vital signs reviewed and stable Respiratory status: spontaneous breathing, nonlabored ventilation, respiratory function stable and patient connected to nasal cannula oxygen Cardiovascular status: blood pressure returned to baseline and stable Postop Assessment: no apparent nausea or vomiting Anesthetic complications: no    Last Vitals:  Vitals:   12/29/18 1545 12/29/18 1951  BP: 120/61 114/62  Pulse: 94 93  Resp: 19 (!) 22  Temp: (!) 36.4 C 36.7 C  SpO2: 98% 98%    Last Pain:  Vitals:   12/29/18 1958  TempSrc:   PainSc: 0-No pain                 Edwyn Inclan P Hrishikesh Hoeg

## 2018-12-29 NOTE — Anesthesia Procedure Notes (Signed)
Arterial Line Insertion Start/End11/03/2018 7:15 AM, 12/29/2018 7:20 AM Performed by: Murvin Natal, MD, Imagene Riches, CRNA, CRNA  Patient location: Pre-op. Preanesthetic checklist: patient identified, IV checked, site marked, risks and benefits discussed, surgical consent, monitors and equipment checked, pre-op evaluation, timeout performed and anesthesia consent Lidocaine 1% used for infiltration Right, radial was placed Catheter size: 20 Fr Hand hygiene performed  and maximum sterile barriers used   Attempts: 1 Procedure performed using ultrasound guided technique. Ultrasound Notes:anatomy identified, needle tip was noted to be adjacent to the nerve/plexus identified and no ultrasound evidence of intravascular and/or intraneural injection Following insertion, dressing applied and Biopatch. Post procedure assessment: normal and unchanged  Patient tolerated the procedure well with no immediate complications.

## 2018-12-30 ENCOUNTER — Encounter (HOSPITAL_COMMUNITY): Payer: Self-pay | Admitting: Thoracic Surgery (Cardiothoracic Vascular Surgery)

## 2018-12-30 ENCOUNTER — Inpatient Hospital Stay (HOSPITAL_COMMUNITY): Payer: Medicare Other

## 2018-12-30 LAB — CBC
HCT: 33.6 % — ABNORMAL LOW (ref 36.0–46.0)
Hemoglobin: 11.2 g/dL — ABNORMAL LOW (ref 12.0–15.0)
MCH: 32.3 pg (ref 26.0–34.0)
MCHC: 33.3 g/dL (ref 30.0–36.0)
MCV: 96.8 fL (ref 80.0–100.0)
Platelets: 184 10*3/uL (ref 150–400)
RBC: 3.47 MIL/uL — ABNORMAL LOW (ref 3.87–5.11)
RDW: 13.2 % (ref 11.5–15.5)
WBC: 15.8 10*3/uL — ABNORMAL HIGH (ref 4.0–10.5)
nRBC: 0 % (ref 0.0–0.2)

## 2018-12-30 LAB — BLOOD GAS, ARTERIAL
Acid-Base Excess: 2.7 mmol/L — ABNORMAL HIGH (ref 0.0–2.0)
Bicarbonate: 26.9 mmol/L (ref 20.0–28.0)
Drawn by: 347191
FIO2: 21
O2 Saturation: 95.5 %
Patient temperature: 36.7
pCO2 arterial: 42.7 mmHg (ref 32.0–48.0)
pH, Arterial: 7.415 (ref 7.350–7.450)
pO2, Arterial: 76.3 mmHg — ABNORMAL LOW (ref 83.0–108.0)

## 2018-12-30 LAB — BASIC METABOLIC PANEL
Anion gap: 7 (ref 5–15)
BUN: 11 mg/dL (ref 8–23)
CO2: 26 mmol/L (ref 22–32)
Calcium: 8.5 mg/dL — ABNORMAL LOW (ref 8.9–10.3)
Chloride: 107 mmol/L (ref 98–111)
Creatinine, Ser: 0.82 mg/dL (ref 0.44–1.00)
GFR calc Af Amer: >60 mL/min (ref >60)
GFR calc non Af Amer: >60 mL/min (ref >60)
Glucose, Bld: 106 mg/dL — ABNORMAL HIGH (ref 70–99)
Potassium: 4.5 mmol/L (ref 3.5–5.1)
Sodium: 140 mmol/L (ref 135–145)

## 2018-12-30 LAB — GLUCOSE, CAPILLARY
Glucose-Capillary: 100 mg/dL — ABNORMAL HIGH (ref 70–99)
Glucose-Capillary: 101 mg/dL — ABNORMAL HIGH (ref 70–99)
Glucose-Capillary: 107 mg/dL — ABNORMAL HIGH (ref 70–99)
Glucose-Capillary: 112 mg/dL — ABNORMAL HIGH (ref 70–99)
Glucose-Capillary: 122 mg/dL — ABNORMAL HIGH (ref 70–99)
Glucose-Capillary: 96 mg/dL (ref 70–99)

## 2018-12-30 NOTE — Progress Notes (Signed)
Reported by daughter that she accidentally knock out the  [place]. No break noted only the output is on the diff. Level . Continue to monitor.

## 2018-12-30 NOTE — Progress Notes (Signed)
Latest chest x-ray result relayed to PA who claimed she saw the result. No order given continue to monitor.

## 2018-12-30 NOTE — Progress Notes (Signed)
Provided with lung pillow .

## 2018-12-30 NOTE — Discharge Summary (Signed)
Physician Discharge Summary        [name]       [name] [address]             [phone]        Patient ID: Jennifer [name] MRN: [id number] DOB/AGE: 1945-12-29 73 y.o.  Admit date: 12/29/2018 Discharge date: 01/21/2019  Admission Diagnoses:  Patient Active Problem List   Diagnosis Date Noted   S/P lung surgery, follow-up exam 01/19/2019   Lung nodule 12/29/2018   Tobacco use 12/11/2011   Essential hypertension 12/11/2011    Discharge Diagnoses:  Active Problems:   Lung nodule   S/P lung surgery, follow-up exam   Discharged Condition: good  HPI:   Jennifer [name] y.o.femalepresents for surgical evaluation of a 1.9cm left upper lobe pulmonary nodule. She was seen by a new PCP, and due to her smoking Hx, and CT chest was ordered which identified this nodule. She denies any neurologic symptoms, and her weight has been stable. She has a chronic cough, but does not have any exertional dyspnea. She continues to smoke, and this has been worst with her anxiety.   Hospital Course:   Jennifer [name] underwent a bronchoscopy, left upper lobectomy, and mediastinal lymph node sampling with Dr. Kipp Brood. She was transferred to [facility] and extubated in a timely manner. POD 1 she was doing well, tolerating room air. Her incisions were healing well. She is eating and drinking without nausea or vomiting. She continued to use her incentive spirometer. Her CXR showed a small left apical pneumothorax. Left chest wall subcutaneous emphysema has increased slightly from her prior xray. She has good oxygen saturation on room air. We continued to encourage ambulation.  Her pain has been  well controlled. Jennifer [name] continued to have an air leak therefore on 11/10 she underwent endobronchial valve placement in the basilar segments of the lower lobe and betadine pleurodesis. She tolerated the procedure well and was transferred back to [facility]. She had some new subcutaneous emphysema  on 11/11 and she was placed back to 10cm of suction. We continued suction for now. On 11/13 we tried water seal since the patient's chest xray remained stable. She still had a small air leak in her chest tube. Her subcutaneous emphysema was improving. She continued to ambulate without issue and use her incentive spirometer and flutter valve. She continued to have a leak. On 11/17 we took her back to the OR to place more EBV via bronchoscopy. On 11/18 there was no change in her chest tube leak. We debated taking her back to the OR for exploration but in stead tried her chest tube on water seal to see if it would make a difference in her air leak. On 11/19 her leak appeared improved. There was no leak at rest but once the patient coughed there was a leak present. It was decided to let the lung heal for a few days before attempting chest tube removal. We continued to get serial chest xrays daily for evaluation.  On 01/19/2019 Jennifer [name] was brought back to the OR for a redo video-assisted thoracoscopy surgery.  We inserted a pigtail catheter and removed her old chest tube.  Post surgery, it seemed that there was total resolution of her air leak.  Her pigtail catheter was removed on 11/24. Her follow-up PA/lateral chest xray was stable.   Today she is tolerating room air, her incisions are healing well, she is ambulating without assistance, and she is ready for discharge home.  Consults: None  Significant Diagnostic Studies:  CLINICAL DATA:  Status post left VATS, chest tube  EXAM: PORTABLE CHEST 1 VIEW  COMPARISON:  12/29/2018, 7:13 a.m.  FINDINGS: Interval postoperative findings of left upper lobectomy with volume loss of the left hemithorax, left-sided chest tube in position, and no significant pneumothorax. Subcutaneous emphysema about the left chest. The right lung is normally aerated. The heart and mediastinum are unremarkable  IMPRESSION: 1. Interval postoperative findings of left upper  lobectomy with volume loss of the left hemithorax, left-sided chest tube in position, and no significant pneumothorax.  2.  Subcutaneous emphysema about the left chest.  3.  The right lung is normally aerated.   Electronically Signed   By: Eddie Candle M.D.   On: 12/29/2018 12:06   Treatments:  12/29/2018  Patient:  Jennifer [name] Pre-Op Dx: left upper lobe pulmonary nodule   Post-op Dx:  same Procedure: - Bronchoscopy - Left Video assisted thoracoscopy - left upper lobectomy - Mediastinal lymph node sampling - Intercostal nerve block  Surgeon and Role:      * Lightfoot, Lucile Crater, MD - Primary    * T. Harriet Pho, PA-C - assisting   Anesthesia  general EBL:  275 ml Blood Administration: none  Specimen:  Left upper lobectomy.  Station 5, 6, 9, 11, 12 lymph nodes  Drains: 28 F argyle chest tube in left chest Counts: correct   Indications: 73 year old female was evaluated in clinic for a left upper lobe pulmonary nodule that was PET avid.  She underwent a navigational bronchoscopy that showed that the nodule was TTF1 positive and thus concerning for NSCLC.    Findings: Normal bronchoscopy.  Incomplete fissure.    Operative Technique:  After the risks, benefits and alternatives were thoroughly discussed, the patient was brought to the operative theatre.  Anesthesia was induced, and the bronchoscope was passed through the endotracheal tube.  All segmental bronchi were visualized.  The endotracheal tube was then exchanged for a double lumen tube.  The patient was then placed in a right lateral decubitus position and was prepped and draped in normal sterile fashion.  An appropriate surgical pause was performed, and pre-operative antibiotics were dosed accordingly.  We began with 3cm incision in the anterior axillary line at the 8th intercostal space.  The chest was entered, and we then placed a 1cm incision at the 10th intercostal space, and introduced our camera  port.  The lung was directly visualized.  There was an apical adhesion that was lysed.  We then made a 4cm incision in the 2nd intercostal space, and entered the chest under direct visualization.  The lung was then retracted superiorly, and the inferior pulmonary ligament was divided.  The hilum was mobilized anteriorly and posteriorly.  We identified the upper lobe vein, and after careful isolation, it was divided with an endo- GIA stapler.  We next moved to the  Truncus anterior.  The artery was then divided with an endo-GIA stapler.  We then moved to the fissure, and isolated the lingular branches prior to dividing them.  The bronchus to the upper lobe was then isolated.  After a test clamp, with good ventilation of the lower, the bronchus was then divided.  The fissure was completed, and the specimen was passed into an endocatch bag.  It was removed from the superior access site.    Lymph nodes were then sampled at levels 5,6,9,11, and 12.  The chest was irrigated, and an air leak test was performed.  An intercostal nerve block was performed under direct visualization.  A 53F chest with then placed, and we watch the remaining lobes re-expand.  The skin and soft tissue were closed with absorbable suture    The patient tolerated the procedure without any immediate complications, and was transferred to the PACU in stable condition.  Lajuana Matte  Discharge Exam: Blood pressure 137/89, pulse 98, temperature 98.2 F (36.8 C), temperature source Oral, resp. rate 18, height 5' 9.02" (1.753 m), weight 71 kg, SpO2 94 %.   General appearance: alert, cooperative and no distress Heart: regular rate and rhythm, S1, S2 normal, no murmur, click, rub or gallop Lungs: clear to auscultation bilaterally Abdomen: soft, non-tender; bowel sounds normal; no masses,  no organomegaly Extremities: extremities normal, atraumatic, no cyanosis or edema Wound: clean and dry  Disposition: Discharge disposition:  01-Home or Self Care       Discharge Instructions    Discharge patient   Complete by: As directed    Discharge disposition: 01-Home or Self Care   Discharge patient date: 01/21/2019     Allergies as of 01/21/2019      Reactions   Aspirin Hives   Latex Rash      Medication List    STOP taking these medications   alendronate 10 MG tablet Commonly known as: FOSAMAX     TAKE these medications   acetaminophen 500 MG tablet Commonly known as: TYLENOL Take 2 tablets (1,000 mg total) by mouth every 6 (six) hours.   atorvastatin 40 MG tablet Commonly known as: LIPITOR Take 40 mg by mouth daily.   CALCIUM 600 + D PO Take 1 tablet by mouth daily.   carbamide peroxide 6.5 % OTIC solution Commonly known as: DEBROX Place 5 drops into the left ear 2 (two) times daily.   guaiFENesin 600 MG 12 hr tablet Commonly known as: MUCINEX Take 1 tablet (600 mg total) by mouth 2 (two) times daily.   lisinopril 30 MG tablet Commonly known as: ZESTRIL Take 30 mg by mouth daily. What changed: Another medication with the same name was removed. Continue taking this medication, and follow the directions you see here.   traMADol 50 MG tablet Commonly known as: ULTRAM Take 1 tablet (50 mg total) by mouth every 12 (twelve) hours as needed (mild pain).      Follow-up Information    Beverley Fiedler, FNP. Call in 1 day(s).   Specialty: Endocrinology Contact information: [place] [address] [phone]        Lajuana Matte, MD Follow up.   Specialty: Cardiothoracic Surgery Why: Please see discharge paperwork when discharged Contact information: [place] [address] [phone]           Signed: Elgie Collard 01/21/2019, 8:15 AM

## 2018-12-30 NOTE — Plan of Care (Signed)
  Problem: Education: Goal: Knowledge of disease or condition will improve Outcome: Progressing   Problem: Activity: Goal: Risk for activity intolerance will decrease Outcome: Progressing   Problem: Clinical Measurements: Goal: Postoperative complications will be avoided or minimized Outcome: Progressing   Problem: Pain Management: Goal: Pain level will decrease Outcome: Progressing   Problem: Skin Integrity: Goal: Wound healing without signs and symptoms infection will improve Outcome: Progressing   Problem: Coping: Goal: Level of anxiety will decrease Outcome: Progressing   Problem: Pain Managment: Goal: General experience of comfort will improve Outcome: Progressing   Problem: Safety: Goal: Ability to remain free from injury will improve Outcome: Progressing

## 2018-12-30 NOTE — Progress Notes (Signed)
Ambulated along the hallway with front wheel walker about 350 feet. Complained of feeling weak while walking. Assisted to bed made comfortable.

## 2018-12-30 NOTE — Progress Notes (Addendum)
      [address]       [place] [address]             [phone]      1 Day Post-Op Procedure(s) (LRB): VIDEO BRONCHOSCOPY (N/A) VIDEO ASSISTED THORACOSCOPY (VATS)/LEFT UPPER  LOBECTOMY with Mediastinal lymph node exploration. (Left) Subjective: Feels good this morning. Her pain is well controlled with tylenol.   Objective: Vital signs in last 24 hours: Temp:  [97 F (36.1 C)-98.1 F (36.7 C)] 97.8 F (36.6 C) (11/03 0742) Pulse Rate:  [50-100] 87 (11/03 0742) Cardiac Rhythm: Normal sinus rhythm (11/03 0742) Resp:  [13-24] 19 (11/03 0742) BP: (106-154)/(55-78) 127/67 (11/03 0742) SpO2:  [95 %-100 %] 98 % (11/03 0742) Arterial Line BP: (95-164)/(48-98) 125/70 (11/02 1500)    Intake/Output from previous day: 11/02 0701 - 11/03 0700 In: 2340 [P.O.:240; I.V.:1800; IV Piggyback:300] Out: 995 [Urine:250; Blood:275; Chest Tube:470] Intake/Output this shift: Total I/O In: 357 [P.O.:357] Out: -   General appearance: alert, cooperative and no distress Heart: regular rate and rhythm, S1, S2 normal, no murmur, click, rub or gallop Lungs: clear to auscultation bilaterally Abdomen: soft, non-tender; bowel sounds normal; no masses,  no organomegaly Extremities: extremities normal, atraumatic, no cyanosis or edema Wound: clean and dry  Lab Results: Recent Labs    12/30/18 0211  WBC 15.8*  HGB 11.2*  HCT 33.6*  PLT 184   BMET:  Recent Labs    12/30/18 0211  NA 140  K 4.5  CL 107  CO2 26  GLUCOSE 106*  BUN 11  CREATININE 0.82  CALCIUM 8.5*    PT/INR: No results for input(s): LABPROT, INR in the last 72 hours. ABG    Component Value Date/Time   PHART 7.415 12/30/2018 0356   HCO3 26.9 12/30/2018 0356   O2SAT 95.5 12/30/2018 0356   CBG (last 3)  Recent Labs    12/29/18 2339 12/30/18 0426 12/30/18 0810  GLUCAP 83 96 100*    Assessment/Plan: S/P Procedure(s) (LRB): VIDEO BRONCHOSCOPY (N/A) VIDEO ASSISTED THORACOSCOPY (VATS)/LEFT UPPER   LOBECTOMY with Mediastinal lymph node exploration. (Left)  1. CV- NSR, BP well controlled. Hold home dose lisinopril for now. Continue statin. 2. Pulm- tolerating room air with good oxygen saturation.  3. Chest tube-++ air leak with cough, on water seal. Waiting for CXR.  4. GI- no nausea. Tolerated a regular diet last night 5. No lovenox for now. 6. Renal-creatinine 0.82, electrolytes okay   Plan: Eating and drinking. Encouraged to ambulate this morning. Using her incentive spirometer and getting up to around 1200. Pain is well controlled. Holding lisinopril for now since BP is well controlled.     LOS: 1 day    Elgie Collard 12/30/2018   Agree with above. 1column air leak with cough.  Lung up on cxr Will keep to WS Aggressive pulm toilet

## 2018-12-31 ENCOUNTER — Inpatient Hospital Stay (HOSPITAL_COMMUNITY): Payer: Medicare Other

## 2018-12-31 LAB — SURGICAL PATHOLOGY

## 2018-12-31 LAB — GLUCOSE, CAPILLARY
Glucose-Capillary: 100 mg/dL — ABNORMAL HIGH (ref 70–99)
Glucose-Capillary: 102 mg/dL — ABNORMAL HIGH (ref 70–99)
Glucose-Capillary: 114 mg/dL — ABNORMAL HIGH (ref 70–99)
Glucose-Capillary: 122 mg/dL — ABNORMAL HIGH (ref 70–99)
Glucose-Capillary: 137 mg/dL — ABNORMAL HIGH (ref 70–99)
Glucose-Capillary: 92 mg/dL (ref 70–99)

## 2018-12-31 LAB — CBC
HCT: 33.4 % — ABNORMAL LOW (ref 36.0–46.0)
Hemoglobin: 10.9 g/dL — ABNORMAL LOW (ref 12.0–15.0)
MCH: 31.9 pg (ref 26.0–34.0)
MCHC: 32.6 g/dL (ref 30.0–36.0)
MCV: 97.7 fL (ref 80.0–100.0)
Platelets: 187 10*3/uL (ref 150–400)
RBC: 3.42 MIL/uL — ABNORMAL LOW (ref 3.87–5.11)
RDW: 13.4 % (ref 11.5–15.5)
WBC: 13.8 10*3/uL — ABNORMAL HIGH (ref 4.0–10.5)
nRBC: 0 % (ref 0.0–0.2)

## 2018-12-31 LAB — COMPREHENSIVE METABOLIC PANEL
ALT: 16 U/L (ref 0–44)
AST: 24 U/L (ref 15–41)
Albumin: 3.3 g/dL — ABNORMAL LOW (ref 3.5–5.0)
Alkaline Phosphatase: 71 U/L (ref 38–126)
Anion gap: 9 (ref 5–15)
BUN: 15 mg/dL (ref 8–23)
CO2: 25 mmol/L (ref 22–32)
Calcium: 8.6 mg/dL — ABNORMAL LOW (ref 8.9–10.3)
Chloride: 106 mmol/L (ref 98–111)
Creatinine, Ser: 1 mg/dL (ref 0.44–1.00)
GFR calc Af Amer: >60 mL/min (ref >60)
GFR calc non Af Amer: 56 mL/min — ABNORMAL LOW (ref >60)
Glucose, Bld: 105 mg/dL — ABNORMAL HIGH (ref 70–99)
Potassium: 4.1 mmol/L (ref 3.5–5.1)
Sodium: 140 mmol/L (ref 135–145)
Total Bilirubin: 0.6 mg/dL (ref 0.3–1.2)
Total Protein: 5.7 g/dL — ABNORMAL LOW (ref 6.5–8.1)

## 2018-12-31 MED ORDER — LISINOPRIL 5 MG PO TABS
15.0000 mg | ORAL_TABLET | Freq: Every day | ORAL | Status: DC
  Administered 2018-12-31: 09:00:00 15 mg via ORAL
  Filled 2018-12-31: qty 1

## 2018-12-31 NOTE — Progress Notes (Signed)
      [address]       [place] [address]             [phone]      2 Days Post-Op Procedure(s) (LRB): VIDEO BRONCHOSCOPY (N/A) VIDEO ASSISTED THORACOSCOPY (VATS)/LEFT UPPER  LOBECTOMY with Mediastinal lymph node exploration. (Left) Subjective: Feels okay this morning. She states she had a rough night trying to get comfortable with the chest tube in place.   Objective: Vital signs in last 24 hours: Temp:  [97.6 F (36.4 C)-98.4 F (36.9 C)] 97.7 F (36.5 C) (11/04 0737) Pulse Rate:  [81-92] 89 (11/04 0737) Cardiac Rhythm: Normal sinus rhythm (11/04 0703) Resp:  [16-23] 21 (11/04 0737) BP: (118-168)/(60-76) 168/72 (11/04 0737) SpO2:  [96 %-100 %] 96 % (11/04 0737)     Intake/Output from previous day: 11/03 0701 - 11/04 0700 In: 357 [P.O.:357] Out: 200 [Chest Tube:200] Intake/Output this shift: No intake/output data recorded.  General appearance: alert, cooperative and no distress Heart: regular rate and rhythm, S1, S2 normal, no murmur, click, rub or gallop Lungs: clear to auscultation bilaterally Abdomen: soft, non-tender; bowel sounds normal; no masses,  no organomegaly Extremities: extremities normal, atraumatic, no cyanosis or edema Wound: clean and dry  Lab Results: Recent Labs    12/30/18 0211 12/31/18 0155  WBC 15.8* 13.8*  HGB 11.2* 10.9*  HCT 33.6* 33.4*  PLT 184 187   BMET:  Recent Labs    12/30/18 0211 12/31/18 0155  NA 140 140  K 4.5 4.1  CL 107 106  CO2 26 25  GLUCOSE 106* 105*  BUN 11 15  CREATININE 0.82 1.00  CALCIUM 8.5* 8.6*    PT/INR: No results for input(s): LABPROT, INR in the last 72 hours. ABG    Component Value Date/Time   PHART 7.415 12/30/2018 0356   HCO3 26.9 12/30/2018 0356   O2SAT 95.5 12/30/2018 0356   CBG (last 3)  Recent Labs    12/30/18 1958 12/30/18 2347 12/31/18 0328  GLUCAP 122* 107* 102*    Assessment/Plan: S/P Procedure(s) (LRB): VIDEO BRONCHOSCOPY (N/A) VIDEO ASSISTED  THORACOSCOPY (VATS)/LEFT UPPER  LOBECTOMY with Mediastinal lymph node exploration. (Left)  1. CV- NSR, BP rising. Start back on home lisinopril and titrate. Continue statin. 2. Pulm- tolerating room air with good oxygen saturation.  3. Chest tube-++ air leak with cough, on water seal. No pneumo on CXR but increased subq air in the left chest wall. 4. GI- no nausea. Tolerated a regular diet last night 5. No lovenox for now. 6. Renal-creatinine 0.82, electrolytes okay  Plan: Add back lisinopril. Pain well controlled-she did need tramadol last night. CXR tomorrow. Keep chest tube for now on water seal.    LOS: 2 days    Elgie Collard 12/31/2018

## 2019-01-01 ENCOUNTER — Inpatient Hospital Stay (HOSPITAL_COMMUNITY): Payer: Medicare Other

## 2019-01-01 LAB — GLUCOSE, CAPILLARY
Glucose-Capillary: 101 mg/dL — ABNORMAL HIGH (ref 70–99)
Glucose-Capillary: 104 mg/dL — ABNORMAL HIGH (ref 70–99)
Glucose-Capillary: 112 mg/dL — ABNORMAL HIGH (ref 70–99)
Glucose-Capillary: 119 mg/dL — ABNORMAL HIGH (ref 70–99)
Glucose-Capillary: 72 mg/dL (ref 70–99)
Glucose-Capillary: 83 mg/dL (ref 70–99)

## 2019-01-01 MED ORDER — LISINOPRIL 20 MG PO TABS
30.0000 mg | ORAL_TABLET | Freq: Every day | ORAL | Status: DC
  Administered 2019-01-01 – 2019-01-04 (×4): 30 mg via ORAL
  Filled 2019-01-01 (×4): qty 1

## 2019-01-01 NOTE — Plan of Care (Signed)
  Problem: Education: Goal: Knowledge of disease or condition will improve Outcome: Progressing Goal: Knowledge of the prescribed therapeutic regimen will improve Outcome: Progressing   Problem: Activity: Goal: Risk for activity intolerance will decrease Outcome: Progressing   Problem: Cardiac: Goal: Will achieve and/or maintain hemodynamic stability Outcome: Progressing   Problem: Clinical Measurements: Goal: Postoperative complications will be avoided or minimized Outcome: Progressing   Problem: Respiratory: Goal: Respiratory status will improve Outcome: Progressing   Problem: Pain Management: Goal: Pain level will decrease Outcome: Progressing   Problem: Skin Integrity: Goal: Wound healing without signs and symptoms infection will improve Outcome: Progressing   Problem: Education: Goal: Knowledge of General Education information will improve Description: Including pain rating scale, medication(s)/side effects and non-pharmacologic comfort measures Outcome: Progressing   Problem: Clinical Measurements: Goal: Ability to maintain clinical measurements within normal limits will improve Outcome: Progressing Goal: Will remain free from infection Outcome: Progressing Goal: Diagnostic test results will improve Outcome: Progressing Goal: Respiratory complications will improve Outcome: Progressing Goal: Cardiovascular complication will be avoided Outcome: Progressing   Problem: Activity: Goal: Risk for activity intolerance will decrease Outcome: Progressing   Problem: Nutrition: Goal: Adequate nutrition will be maintained Outcome: Progressing   Problem: Coping: Goal: Level of anxiety will decrease Outcome: Progressing   Problem: Elimination: Goal: Will not experience complications related to bowel motility Outcome: Progressing Goal: Will not experience complications related to urinary retention Outcome: Progressing   Problem: Pain Managment: Goal: General  experience of comfort will improve Outcome: Progressing   Problem: Safety: Goal: Ability to remain free from injury will improve Outcome: Progressing   Problem: Skin Integrity: Goal: Risk for impaired skin integrity will decrease Outcome: Progressing

## 2019-01-01 NOTE — Progress Notes (Signed)
      [address]       [place] [address]             [phone]      3 Days Post-Op Procedure(s) (LRB): VIDEO BRONCHOSCOPY (N/A) VIDEO ASSISTED THORACOSCOPY (VATS)/LEFT UPPER  LOBECTOMY with Mediastinal lymph node exploration. (Left) Subjective: Feels good. Pain is well controlled. Asking when she can go home.  Objective: Vital signs in last 24 hours: Temp:  [97.7 F (36.5 C)-98.1 F (36.7 C)] 97.9 F (36.6 C) (11/05 0501) Pulse Rate:  [68-98] 98 (11/05 0501) Cardiac Rhythm: Normal sinus rhythm (11/05 0705) Resp:  [14-23] 23 (11/05 0501) BP: (135-172)/(64-72) 165/72 (11/05 0501) SpO2:  [94 %-98 %] 94 % (11/05 0501)     Intake/Output from previous day: 11/04 0701 - 11/05 0700 In: 720 [P.O.:720] Out: 90 [Chest Tube:90] Intake/Output this shift: No intake/output data recorded.  General appearance: alert, cooperative and no distress Heart: regular rate and rhythm, S1, S2 normal, no murmur, click, rub or gallop Lungs: clear to auscultation bilaterally Abdomen: soft, non-tender; bowel sounds normal; no masses,  no organomegaly Extremities: extremities normal, atraumatic, no cyanosis or edema Wound: clean and dry  Lab Results: Recent Labs    12/30/18 0211 12/31/18 0155  WBC 15.8* 13.8*  HGB 11.2* 10.9*  HCT 33.6* 33.4*  PLT 184 187   BMET:  Recent Labs    12/30/18 0211 12/31/18 0155  NA 140 140  K 4.5 4.1  CL 107 106  CO2 26 25  GLUCOSE 106* 105*  BUN 11 15  CREATININE 0.82 1.00  CALCIUM 8.5* 8.6*    PT/INR: No results for input(s): LABPROT, INR in the last 72 hours. ABG    Component Value Date/Time   PHART 7.415 12/30/2018 0356   HCO3 26.9 12/30/2018 0356   O2SAT 95.5 12/30/2018 0356   CBG (last 3)  Recent Labs    12/31/18 1945 12/31/18 2331 01/01/19 0518  GLUCAP 122* 100* 72    Assessment/Plan: S/P Procedure(s) (LRB): VIDEO BRONCHOSCOPY (N/A) VIDEO ASSISTED THORACOSCOPY (VATS)/LEFT UPPER  LOBECTOMY with Mediastinal  lymph node exploration. (Left)  1. CV- NSR, BP rising. Will increase lisinopril. Continue statin. 2. Pulm- tolerating room air with good oxygen saturation.  3. Chest tube-+ air leak with cough, on water seal. No pneumo on CXR and subq emphysema the same.  4. GI- no nausea. Tolerated a regular diet last night 5. No lovenox for now. 6. Renal-creatinine 1.00, electrolytes okay  Plan: Will place back on 20cm of suction.  Continue to use incentive spirometer and flutter valve. Continue to ambulate in the halls. Increase lisinopril.       LOS: 3 days    Elgie Collard 01/01/2019

## 2019-01-01 NOTE — Care Management Important Message (Signed)
Important Message  Patient Details  Name: Jennifer [name] MRN: [id number] Date of Birth: Nov 08, 1945   Medicare Important Message Given:  Yes     Shelda Altes 01/01/2019, 1:55 PM

## 2019-01-02 ENCOUNTER — Inpatient Hospital Stay (HOSPITAL_COMMUNITY): Payer: Medicare Other

## 2019-01-02 LAB — GLUCOSE, CAPILLARY
Glucose-Capillary: 99 mg/dL (ref 70–99)
Glucose-Capillary: 133 mg/dL — ABNORMAL HIGH (ref 70–99)
Glucose-Capillary: 105 mg/dL — ABNORMAL HIGH (ref 70–99)
Glucose-Capillary: 135 mg/dL — ABNORMAL HIGH (ref 70–99)
Glucose-Capillary: 119 mg/dL — ABNORMAL HIGH (ref 70–99)

## 2019-01-02 MED ORDER — ENOXAPARIN SODIUM 30 MG/0.3ML ~~LOC~~ SOLN
30.0000 mg | SUBCUTANEOUS | Status: DC
  Administered 2019-01-03 – 2019-01-15 (×7): 30 mg via SUBCUTANEOUS
  Filled 2019-01-02 (×16): qty 0.3

## 2019-01-02 NOTE — Progress Notes (Signed)
Ambulated along the hallway with front wheel walker  About 500 feet, tolerated well.

## 2019-01-02 NOTE — Progress Notes (Addendum)
      [address]       [place] [address]             [phone]      4 Days Post-Op Procedure(s) (LRB): VIDEO BRONCHOSCOPY (N/A) VIDEO ASSISTED THORACOSCOPY (VATS)/LEFT UPPER  LOBECTOMY with Mediastinal lymph node exploration. (Left)   Subjective:  Doing okay.  Has some pain under left breast with inspiration.  Frustrated her chest tube isn't coming out.   Objective: Vital signs in last 24 hours: Temp:  [97.8 F (36.6 C)-98.3 F (36.8 C)] 97.8 F (36.6 C) (11/06 0747) Pulse Rate:  [79-99] 91 (11/06 0747) Cardiac Rhythm: Normal sinus rhythm (11/06 0703) Resp:  [16-20] 20 (11/06 0747) BP: (132-168)/(59-86) 158/77 (11/06 0747) SpO2:  [96 %-100 %] 96 % (11/06 0747)  Intake/Output from previous day: 11/05 0701 - 11/06 0700 In: 960 [P.O.:960] Out: 40 [Chest Tube:40]  General appearance: alert, cooperative and no distress Heart: regular rate and rhythm Lungs: clear to auscultation bilaterally Abdomen: soft, non-tender; bowel sounds normal; no masses,  no organomegaly Wound: clean and dry  Lab Results: Recent Labs    12/31/18 0155  WBC 13.8*  HGB 10.9*  HCT 33.4*  PLT 187   BMET:  Recent Labs    12/31/18 0155  NA 140  K 4.1  CL 106  CO2 25  GLUCOSE 105*  BUN 15  CREATININE 1.00  CALCIUM 8.6*    PT/INR: No results for input(s): LABPROT, INR in the last 72 hours. ABG    Component Value Date/Time   PHART 7.415 12/30/2018 0356   HCO3 26.9 12/30/2018 0356   O2SAT 95.5 12/30/2018 0356   CBG (last 3)  Recent Labs    01/01/19 2347 01/02/19 0343 01/02/19 0749  GLUCAP 101* 99 133*    Assessment/Plan: S/P Procedure(s) (LRB): VIDEO BRONCHOSCOPY (N/A) VIDEO ASSISTED THORACOSCOPY (VATS)/LEFT UPPER  LOBECTOMY with Mediastinal lymph node exploration. (Left)  1. CV- NSR, + HTN restarted on home regimen of Zestril yesterday, will monitor 2. Pulm- + air leak from chest tube, CXR with left apical space- leave chest tube on suction today,  however decreased to -10 cm per Dr. Kipp Brood 3. Renal- creatinine has been stable 4. DVT prophylaxis, will start low dose Lovenox 5. CBGs controlled, patient is not a diabetic, will d/c SSIP 6. Dispo- patient stable, chest tube with air leak will decrease suction to -10 cm suction, repeat CXR in AM   LOS: 4 days    Ellwood Handler, PA-C  01/02/2019

## 2019-01-03 ENCOUNTER — Inpatient Hospital Stay (HOSPITAL_COMMUNITY): Payer: Medicare Other

## 2019-01-03 MED ORDER — LOPERAMIDE HCL 2 MG PO CAPS
2.0000 mg | ORAL_CAPSULE | Freq: Four times a day (QID) | ORAL | Status: DC | PRN
  Administered 2019-01-03: 18:00:00 2 mg via ORAL
  Filled 2019-01-03: qty 1

## 2019-01-03 NOTE — Progress Notes (Signed)
Pt ambulated in a hallway without a complain of chest pain and distress, daughter is in bed side and updated, pt is in a recliner right now, Chest tube site is clean, dry and intact and output recorded, will continue to monitor the patient  [name], RN

## 2019-01-03 NOTE — Progress Notes (Signed)
After giving dulcolax this am, pt had a 4 times bowel movement so far, called PA and got the order for Imodium, will continue to monitor the patient  [name], RN

## 2019-01-03 NOTE — Plan of Care (Signed)
  Problem: Education: Goal: Knowledge of disease or condition will improve Outcome: Progressing Goal: Knowledge of the prescribed therapeutic regimen will improve Outcome: Progressing   Problem: Activity: Goal: Risk for activity intolerance will decrease Outcome: Progressing   Problem: Cardiac: Goal: Will achieve and/or maintain hemodynamic stability Outcome: Progressing   Problem: Clinical Measurements: Goal: Postoperative complications will be avoided or minimized Outcome: Progressing   Problem: Clinical Measurements: Goal: Postoperative complications will be avoided or minimized Outcome: Progressing   Problem: Respiratory: Goal: Respiratory status will improve Outcome: Progressing

## 2019-01-03 NOTE — Progress Notes (Addendum)
5 Days Post-Op Procedure(s) (LRB): VIDEO BRONCHOSCOPY (N/A) VIDEO ASSISTED THORACOSCOPY (VATS)/LEFT UPPER  LOBECTOMY with Mediastinal lymph node exploration. (Left) Subjective: Up in the bedside chair, reasonably comfortable. Denies shortness of breath on room air.   Objective: Vital signs in last 24 hours: Temp:  [97.9 F (36.6 C)-98.5 F (36.9 C)] 98 F (36.7 C) (11/07 0751) Pulse Rate:  [68-86] 80 (11/07 0308) Cardiac Rhythm: Normal sinus rhythm (11/07 0702) Resp:  [16-20] 18 (11/07 0308) BP: (135-165)/(60-82) 158/63 (11/07 0751) SpO2:  [96 %-99 %] 96 % (11/07 0751)     Intake/Output from previous day: 11/06 0701 - 11/07 0700 In: -  Out: 20 [Chest Tube:20] Intake/Output this shift: Total I/O In: 240 [P.O.:240] Out: 220 [Chest Tube:220]  General appearance: alert, cooperative and no distress Neurologic: intact Heart: regular rate and rhythm Lungs: Breath sound clear, audible air leak on left. Mild left lateral subQ air.  Pleurovac shows active air leak, moderate serous drainage. CXR reviewed: CT position unchanged, trace apical and lateral air space, left lateral subQ air unchanged for several days.  Wound: the left chest dressing is dry.   Lab Results: No results for input(s): WBC, HGB, HCT, PLT in the last 72 hours. BMET: No results for input(s): NA, K, CL, CO2, GLUCOSE, BUN, CREATININE, CALCIUM in the last 72 hours.  PT/INR: No results for input(s): LABPROT, INR in the last 72 hours. ABG    Component Value Date/Time   PHART 7.415 12/30/2018 0356   HCO3 26.9 12/30/2018 0356   O2SAT 95.5 12/30/2018 0356   CBG (last 3)  Recent Labs    01/02/19 1116 01/02/19 1632 01/02/19 2010  GLUCAP 105* 135* 119*    Assessment/Plan: S/P Procedure(s) (LRB): VIDEO BRONCHOSCOPY (N/A) VIDEO ASSISTED THORACOSCOPY (VATS)/LEFT UPPER  LOBECTOMY with Mediastinal lymph node exploration. 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 Stable respiratory status.   Continued air leak with stable subQ air. Leave CT on -10cm suction for now. CXR in AM.  -History of HTN-  BP 150-160's past 24 hours. Lisinopril has been resumed. Monitor.   -DVT PPX-continue enoxaparin   LOS: 5 days    Antony Odea , Hershal Coria [phone] 01/03/2019 Patient seen and examined, agree with above CXR with small apical space   C. Roxan Hockey, MD [facility] [phone]

## 2019-01-04 ENCOUNTER — Inpatient Hospital Stay (HOSPITAL_COMMUNITY): Payer: Medicare Other

## 2019-01-04 MED ORDER — ALBUTEROL SULFATE (2.5 MG/3ML) 0.083% IN NEBU
2.5000 mg | INHALATION_SOLUTION | Freq: Once | RESPIRATORY_TRACT | Status: AC
  Administered 2019-01-04: 08:00:00 2.5 mg via RESPIRATORY_TRACT
  Filled 2019-01-04: qty 3

## 2019-01-04 MED ORDER — ALBUTEROL SULFATE (2.5 MG/3ML) 0.083% IN NEBU
2.5000 mg | INHALATION_SOLUTION | Freq: Once | RESPIRATORY_TRACT | Status: AC | PRN
  Administered 2019-01-04: 01:00:00 2.5 mg via RESPIRATORY_TRACT
  Filled 2019-01-04: qty 3

## 2019-01-04 MED ORDER — ALBUTEROL SULFATE (2.5 MG/3ML) 0.083% IN NEBU: 2.5000 mg | INHALATION_SOLUTION | Freq: Four times a day (QID) | RESPIRATORY_TRACT | Status: DC | PRN

## 2019-01-04 MED ORDER — ACETAMINOPHEN 325 MG PO TABS
650.0000 mg | ORAL_TABLET | Freq: Four times a day (QID) | ORAL | Status: DC | PRN
  Administered 2019-01-07 – 2019-01-09 (×2): 650 mg via ORAL
  Filled 2019-01-04 (×2): qty 2

## 2019-01-04 MED ORDER — GUAIFENESIN ER 600 MG PO TB12
1200.0000 mg | ORAL_TABLET | Freq: Two times a day (BID) | ORAL | Status: AC
  Administered 2019-01-04 – 2019-01-06 (×6): 1200 mg via ORAL
  Filled 2019-01-04 (×6): qty 2

## 2019-01-04 MED ORDER — LISINOPRIL 20 MG PO TABS
40.0000 mg | ORAL_TABLET | Freq: Every day | ORAL | Status: DC
  Administered 2019-01-05 – 2019-01-17 (×12): 40 mg via ORAL
  Filled 2019-01-04 (×13): qty 2

## 2019-01-04 NOTE — Progress Notes (Signed)
After pt ambulated almost 100 ft, she felt pain on her surgical site, tramadol 50 mg given, for future pt wants to be in tylenol not tramadol,  Paged PA Roddenberry  for concern, daughter is in bed side, will continue to monitor the patient  [name], RN

## 2019-01-04 NOTE — Progress Notes (Addendum)
Stat albuterol  2.5mg  given per order of PA Roddenberry, ordering another X-ray, pt is alert and oriented, vitals stable, Oxygen saturation 100, will continue to monitor  [name], RN

## 2019-01-04 NOTE — Progress Notes (Addendum)
Patient developed shortness of breath, but no pain following ambulating to the bathroom.  Patient became tachycardic and had audible expiratory wheezes and had rhonchi upon auscultation.  Rapid response called. Patient's chest tube suction increased to 20 of suction and placed on oxygen for comfort.  Portable CXR performed stat and patient received albuterol treatment. Chest tube dressing changed - prior dressing was moderately saturated with old serosanguinous fluid.  Hendrickson paged and notified of interventions. Hendrickson asked to be updated upon reading of CXR.  Patient states she is feeling "a little better". Will continue to monitor patient closely.  0140[name] called about CXR findings. MD recommended pain medication if needed. No other orders at this time.  Patient remains in bed and is resting comfortably at this time. Will continue to monitor closely.

## 2019-01-04 NOTE — Progress Notes (Signed)
Pt is eating breakfast this time, vitals stable, in Room air 98%, Respiratory sound wheezing, pt states she is not feeling well, paged the PA Roddenberry called and he responded to come and see the patient  Jennifer [name], Therapist, sports

## 2019-01-04 NOTE — Significant Event (Addendum)
Rapid Response Event Note  Overview:Called d/t respiratory distress after walking pt to BR. Pt here s/p L VATs/LUL lobectomy(POD5) Time Called: 0040 Arrival Time: 0044 Event Type: Respiratory  Initial Focused Assessment: Pt laying in bed with eyes closed, alert and oriented. Pt c/o difficulty breathing with no chest pain/tightness. Pt had just gotten up and walked to the bathroom and back. (Pt has done this several times in past without difficulty breathing). Lungs wheezy/rhonchi. Skin warm and dry. L CT to 20 sx with large air leak. Site assessed and SQ air palpated around site (this is not new per MD note). T-98.3, HR-105(ST), BP-159/74, RR-19, SpO2-98% on 2L [place].  Interventions: Pt placed on 2L [place] for comfort while in distress PCXR-Stable appearance of the chest with a persistent small left-sided pneumothorax with a chest tube in place. Albuterol tx-given>pt says she feels much better after tx, RR-14, 100% on RA Plan of Care (if not transferred): PCXR is unchanged from last one-RN to update CVTS of these results. Pt feels and looks better after interventions. Continue to monitor pt and call RRT if further assistance needed.  Event Summary: Name of Physician Notified: Dr. Roxan Hockey at [phone]    at          [facility]

## 2019-01-04 NOTE — Progress Notes (Addendum)
6 Days Post-Op Procedure(s) (LRB): VIDEO BRONCHOSCOPY (N/A) VIDEO ASSISTED THORACOSCOPY (VATS)/LEFT UPPER  LOBECTOMY with Mediastinal lymph node exploration. (Left) Subjective: Events of last night noted. Rapid response called for shortness of breath and chest discomfort after returning to bed from the bathroom. O2 sats dis not decompensate and CXR was stable.  She felt better after O2 2Lnc was added and HHN albuterol given. CT suction was also increased to -20cm H2O suction.  This morning she says she feels much better. She notes some discomfort at the base of her neck bilaterally and also is concerned about "raspy" breath sounds.   Objective: Vital signs in last 24 hours: Temp:  [98 F (36.7 C)-98.5 F (36.9 C)] 98.3 F (36.8 C) (11/08 0724) Pulse Rate:  [68-105] 87 (11/08 0900) Cardiac Rhythm: Normal sinus rhythm (11/08 0800) Resp:  [12-35] 20 (11/08 0900) BP: (137-163)/(54-83) 154/68 (11/08 0724) SpO2:  [96 %-100 %] 97 % (11/08 0724)    Intake/Output from previous day: 11/07 0701 - 11/08 0700 In: 720 [P.O.:720] Out: 370 [Chest Tube:370] Intake/Output this shift: Total I/O In: 100 [P.O.:100] Out: 0   Physical Exam: General appearance: alert, cooperative and mild distress Neurologic: intact Heart: regular rate and rhythm, monitor shws sinus tach. Lungs: Breath sound clear, audible air leak on left. Mild left lateral chest subQ air and new subQ air in her neck, L>R.  Pleurovac shows active air leak, moderate serous drainage. CXR reviewed: CT position unchanged, trace apical and lateral air space, left lateral subQ air unchanged for several days.  Wound: the left chest dressing is dry.    EXAM: PORTABLE CHEST 1 VIEW  COMPARISON:  Chest radiograph 01/04/2019  FINDINGS: Monitoring leads overlie the patient. Left chest tube remains in position. Stable cardiac and mediastinal contours. Persistent small left pneumothorax. Persistent subcutaneous emphysema overlying  the left chest wall. No pleural effusion.  IMPRESSION: Left chest tube in position with persistent small left pneumothorax.   Electronically Signed   By: Lovey Newcomer M.D.   On: 01/04/2019 09:28   Lab Results: No results for input(s): WBC, HGB, HCT, PLT in the last 72 hours. BMET: No results for input(s): NA, K, CL, CO2, GLUCOSE, BUN, CREATININE, CALCIUM in the last 72 hours.  PT/INR: No results for input(s): LABPROT, INR in the last 72 hours. ABG    Component Value Date/Time   PHART 7.415 12/30/2018 0356   HCO3 26.9 12/30/2018 0356   O2SAT 95.5 12/30/2018 0356   CBG (last 3)  Recent Labs    01/02/19 1116 01/02/19 1632 01/02/19 2010  GLUCAP 105* 135* 119*    Assessment/Plan: S/P Procedure(s) (LRB): VIDEO BRONCHOSCOPY (N/A) VIDEO ASSISTED THORACOSCOPY (VATS)/LEFT UPPER  LOBECTOMY with Mediastinal lymph node exploration. (Left)  -POD-6 left upper lobectomy for T2N0 adenocarcinoma.  Short of breath last night. CXR and air leak without significant change. SubQ air now noted at the base of her neck. Leave CT on -20cm suction for now.  -History of HTN-  BP 150-160's past 24 hours. Increase lisinopril to 40mg  po daily. Monitor.   -DVT PPX-continue enoxaparin   LOS: 5 days     LOS: 6 days    Antony Odea, PA-C [phone] 01/04/2019 Patient seen and examined, agree with above C/o diarrhea last night after laxative SOB and increased SQ air after getting up, felt like she couldn't clear mucous. Currently feels better but still very worried about air leak 97% sat on RA, air leak unchanged on exam, some congestion of left, right lung is clear  Add mucinex and flutter Remo Lipps C. Roxan Hockey, MD [facility] [phone]

## 2019-01-05 ENCOUNTER — Inpatient Hospital Stay (HOSPITAL_COMMUNITY): Payer: Medicare Other

## 2019-01-05 LAB — CBC
HCT: 31.2 % — ABNORMAL LOW (ref 36.0–46.0)
Hemoglobin: 10.4 g/dL — ABNORMAL LOW (ref 12.0–15.0)
MCH: 32.3 pg (ref 26.0–34.0)
MCHC: 33.3 g/dL (ref 30.0–36.0)
MCV: 96.9 fL (ref 80.0–100.0)
Platelets: 241 10*3/uL (ref 150–400)
RBC: 3.22 MIL/uL — ABNORMAL LOW (ref 3.87–5.11)
RDW: 13.2 % (ref 11.5–15.5)
WBC: 8.3 10*3/uL (ref 4.0–10.5)
nRBC: 0 % (ref 0.0–0.2)

## 2019-01-05 LAB — BASIC METABOLIC PANEL
Anion gap: 9 (ref 5–15)
BUN: 15 mg/dL (ref 8–23)
CO2: 23 mmol/L (ref 22–32)
Calcium: 8.6 mg/dL — ABNORMAL LOW (ref 8.9–10.3)
Chloride: 103 mmol/L (ref 98–111)
Creatinine, Ser: 0.69 mg/dL (ref 0.44–1.00)
GFR calc Af Amer: >60 mL/min (ref >60)
GFR calc non Af Amer: >60 mL/min (ref >60)
Glucose, Bld: 106 mg/dL — ABNORMAL HIGH (ref 70–99)
Potassium: 4.2 mmol/L (ref 3.5–5.1)
Sodium: 135 mmol/L (ref 135–145)

## 2019-01-05 NOTE — Progress Notes (Signed)
      [address]       [place] [address]             [phone]      7 Days Post-Op Procedure(s) (LRB): VIDEO BRONCHOSCOPY (N/A) VIDEO ASSISTED THORACOSCOPY (VATS)/LEFT UPPER  LOBECTOMY with Mediastinal lymph node exploration. (Left)   Subjective:  No new complaints.  Remains frustrated over chest tube.  Can feel the bubbles along her right chest.  Objective: Vital signs in last 24 hours: Temp:  [98.1 F (36.7 C)-98.4 F (36.9 C)] 98.2 F (36.8 C) (11/09 0744) Pulse Rate:  [69-92] 89 (11/09 0234) Cardiac Rhythm: Normal sinus rhythm (11/09 0400) Resp:  [13-22] 17 (11/09 0234) BP: (124-150)/(53-77) 127/77 (11/09 0233) SpO2:  [95 %-100 %] 98 % (11/09 0744)  Intake/Output from previous day: 11/08 0701 - 11/09 0700 In: 1170 [P.O.:1170] Out: 1430 [Urine:1300; Chest Tube:130]  General appearance: alert, cooperative and no distress Heart: regular rate and rhythm Lungs: diminished breath sounds on right Abdomen: soft, non-tender; bowel sounds normal; no masses,  no organomegaly Extremities: extremities normal, atraumatic, no cyanosis or edema Wound: clean and dry, sub q air present  Lab Results: Recent Labs    01/05/19 0212  WBC 8.3  HGB 10.4*  HCT 31.2*  PLT 241   BMET:  Recent Labs    01/05/19 0212  NA 135  K 4.2  CL 103  CO2 23  GLUCOSE 106*  BUN 15  CREATININE 0.69  CALCIUM 8.6*    PT/INR: No results for input(s): LABPROT, INR in the last 72 hours. ABG    Component Value Date/Time   PHART 7.415 12/30/2018 0356   HCO3 26.9 12/30/2018 0356   O2SAT 95.5 12/30/2018 0356   CBG (last 3)  Recent Labs    01/02/19 1116 01/02/19 1632 01/02/19 2010  GLUCAP 105* 135* 119*    Assessment/Plan: S/P Procedure(s) (LRB): VIDEO BRONCHOSCOPY (N/A) VIDEO ASSISTED THORACOSCOPY (VATS)/LEFT UPPER  LOBECTOMY with Mediastinal lymph node exploration. (Left)  1. CV- hemodynamically stable, HTN stable on Lisinopril 2. Pulm- persistent air leak,  sub q air along left chest, pneumothorax on CXR is stable, leave chest tube on suction today 3. Dispo- patient stable, possible placement of IBV, leave chest tube on suction today, repeat CXR in AM   LOS: 7 days    Jeovany Huitron PA-C 01/05/2019

## 2019-01-05 NOTE — Anesthesia Preprocedure Evaluation (Addendum)
Anesthesia Evaluation  Patient identified by MRN, date of birth, ID band Patient awake    Reviewed: Allergy & Precautions, NPO status   Airway Mallampati: II  TM Distance: >3 FB     Dental  (+) Caps, Teeth Intact   Pulmonary Patient abstained from smoking., former smoker,    breath sounds clear to auscultation       Cardiovascular hypertension,  Rhythm:Regular Rate:Normal     Neuro/Psych    GI/Hepatic negative GI ROS, Neg liver ROS,   Endo/Other  negative endocrine ROS  Renal/GU negative Renal ROS     Musculoskeletal   Abdominal   Peds  Hematology   Anesthesia Other Findings   Reproductive/Obstetrics                           Anesthesia Physical Anesthesia Plan  ASA: III  Anesthesia Plan: General   Post-op Pain Management:    Induction: Intravenous  PONV Risk Score and Plan: Ondansetron, Dexamethasone and Midazolam  Airway Management Planned: Oral ETT  Additional Equipment:   Intra-op Plan:   Post-operative Plan: Extubation in OR  Informed Consent: I have reviewed the patients History and Physical, chart, labs and discussed the procedure including the risks, benefits and alternatives for the proposed anesthesia with the patient or authorized representative who has indicated his/her understanding and acceptance.     Dental advisory given  Plan Discussed with: Anesthesiologist and CRNA  Anesthesia Plan Comments:        Anesthesia Quick Evaluation

## 2019-01-06 ENCOUNTER — Encounter (HOSPITAL_COMMUNITY): Payer: Self-pay | Admitting: Certified Registered"

## 2019-01-06 ENCOUNTER — Inpatient Hospital Stay (HOSPITAL_COMMUNITY): Payer: Medicare Other | Admitting: Certified Registered"

## 2019-01-06 ENCOUNTER — Encounter (HOSPITAL_COMMUNITY)
Admission: RE | Disposition: A | Discharge status: Home / Self Care | Payer: Self-pay | Source: Home / Self Care | Attending: Thoracic Surgery (Cardiothoracic Vascular Surgery)

## 2019-01-06 ENCOUNTER — Ambulatory Visit: Payer: Medicare Other | Admitting: Thoracic Surgery (Cardiothoracic Vascular Surgery)

## 2019-01-06 DIAGNOSIS — J9382 Other air leak: Secondary | ICD-10-CM

## 2019-01-06 HISTORY — PX: PLEURADESIS: SHX6030

## 2019-01-06 HISTORY — PX: VIDEO BRONCHOSCOPY WITH INSERTION OF INTERBRONCHIAL VALVE (IBV): SHX6178

## 2019-01-06 SURGERY — BRONCHOSCOPY, FLEXIBLE, WITH INTRABRONCHIAL VALVE INSERTION
Anesthesia: General

## 2019-01-06 MED ORDER — ROCURONIUM BROMIDE 10 MG/ML (PF) SYRINGE
PREFILLED_SYRINGE | INTRAVENOUS | Status: AC
  Filled 2019-01-06: qty 10

## 2019-01-06 MED ORDER — PHENYLEPHRINE HCL-NACL 10-0.9 MG/250ML-% IV SOLN
INTRAVENOUS | Status: DC | PRN
  Administered 2019-01-06: 60 ug/min via INTRAVENOUS

## 2019-01-06 MED ORDER — FENTANYL CITRATE (PF) 250 MCG/5ML IJ SOLN
INTRAMUSCULAR | Status: AC
  Filled 2019-01-06: qty 5

## 2019-01-06 MED ORDER — FENTANYL CITRATE (PF) 100 MCG/2ML IJ SOLN: 25.0000 ug | INTRAMUSCULAR | Status: DC | PRN

## 2019-01-06 MED ORDER — PHENYLEPHRINE 40 MCG/ML (10ML) SYRINGE FOR IV PUSH (FOR BLOOD PRESSURE SUPPORT)
PREFILLED_SYRINGE | INTRAVENOUS | Status: AC
  Filled 2019-01-06: qty 10

## 2019-01-06 MED ORDER — LIDOCAINE 2% (20 MG/ML) 5 ML SYRINGE
INTRAMUSCULAR | Status: AC
  Filled 2019-01-06: qty 5

## 2019-01-06 MED ORDER — SUGAMMADEX SODIUM 200 MG/2ML IV SOLN
INTRAVENOUS | Status: DC | PRN
  Administered 2019-01-06: 200 mg via INTRAVENOUS

## 2019-01-06 MED ORDER — SUCCINYLCHOLINE CHLORIDE 200 MG/10ML IV SOSY
PREFILLED_SYRINGE | INTRAVENOUS | Status: AC
  Filled 2019-01-06: qty 10

## 2019-01-06 MED ORDER — EPHEDRINE SULFATE-NACL 50-0.9 MG/10ML-% IV SOSY
PREFILLED_SYRINGE | INTRAVENOUS | Status: DC | PRN
  Administered 2019-01-06: 5 mg via INTRAVENOUS

## 2019-01-06 MED ORDER — PHENYLEPHRINE 40 MCG/ML (10ML) SYRINGE FOR IV PUSH (FOR BLOOD PRESSURE SUPPORT)
PREFILLED_SYRINGE | INTRAVENOUS | Status: DC | PRN
  Administered 2019-01-06: 120 ug via INTRAVENOUS
  Administered 2019-01-06 (×3): 80 ug via INTRAVENOUS

## 2019-01-06 MED ORDER — GLYCOPYRROLATE PF 0.2 MG/ML IJ SOSY
PREFILLED_SYRINGE | INTRAMUSCULAR | Status: DC | PRN
  Administered 2019-01-06: .1 mg via INTRAVENOUS

## 2019-01-06 MED ORDER — ONDANSETRON HCL 4 MG/2ML IJ SOLN
INTRAMUSCULAR | Status: DC | PRN
  Administered 2019-01-06: 4 mg via INTRAVENOUS

## 2019-01-06 MED ORDER — LACTATED RINGERS IV SOLN
INTRAVENOUS | Status: DC | PRN
  Administered 2019-01-06: 07:00:00 via INTRAVENOUS

## 2019-01-06 MED ORDER — GLYCOPYRROLATE PF 0.2 MG/ML IJ SOSY
PREFILLED_SYRINGE | INTRAMUSCULAR | Status: AC
  Filled 2019-01-06: qty 1

## 2019-01-06 MED ORDER — ONDANSETRON HCL 4 MG/2ML IJ SOLN
INTRAMUSCULAR | Status: AC
  Filled 2019-01-06: qty 2

## 2019-01-06 MED ORDER — PROPOFOL 10 MG/ML IV BOLUS
INTRAVENOUS | Status: DC | PRN
  Administered 2019-01-06: 150 mg via INTRAVENOUS

## 2019-01-06 MED ORDER — DEXAMETHASONE SODIUM PHOSPHATE 10 MG/ML IJ SOLN
INTRAMUSCULAR | Status: AC
  Filled 2019-01-06: qty 1

## 2019-01-06 MED ORDER — ROCURONIUM BROMIDE 50 MG/5ML IV SOSY
PREFILLED_SYRINGE | INTRAVENOUS | Status: DC | PRN
  Administered 2019-01-06: 40 mg via INTRAVENOUS
  Administered 2019-01-06: 10 mg via INTRAVENOUS

## 2019-01-06 MED ORDER — LIDOCAINE 2% (20 MG/ML) 5 ML SYRINGE
INTRAMUSCULAR | Status: DC | PRN
  Administered 2019-01-06: 80 mg via INTRAVENOUS

## 2019-01-06 MED ORDER — MIDAZOLAM HCL 2 MG/2ML IJ SOLN
INTRAMUSCULAR | Status: AC
  Filled 2019-01-06: qty 2

## 2019-01-06 MED ORDER — DEXAMETHASONE SODIUM PHOSPHATE 10 MG/ML IJ SOLN
INTRAMUSCULAR | Status: DC | PRN
  Administered 2019-01-06: 10 mg via INTRAVENOUS

## 2019-01-06 MED ORDER — PROPOFOL 10 MG/ML IV BOLUS
INTRAVENOUS | Status: AC
  Filled 2019-01-06: qty 20

## 2019-01-06 MED ORDER — EPHEDRINE 5 MG/ML INJ
INTRAVENOUS | Status: AC
  Filled 2019-01-06: qty 10

## 2019-01-06 MED ORDER — MIDAZOLAM HCL 5 MG/5ML IJ SOLN
INTRAMUSCULAR | Status: DC | PRN
  Administered 2019-01-06 (×2): 1 mg via INTRAVENOUS

## 2019-01-06 MED ORDER — SUCCINYLCHOLINE CHLORIDE 200 MG/10ML IV SOSY
PREFILLED_SYRINGE | INTRAVENOUS | Status: DC | PRN
  Administered 2019-01-06: 100 mg via INTRAVENOUS

## 2019-01-06 MED ORDER — 0.9 % SODIUM CHLORIDE (POUR BTL) OPTIME
TOPICAL | Status: DC | PRN
  Administered 2019-01-06: 08:00:00 1000 mL

## 2019-01-06 MED ORDER — FENTANYL CITRATE (PF) 100 MCG/2ML IJ SOLN
INTRAMUSCULAR | Status: DC | PRN
  Administered 2019-01-06 (×2): 50 ug via INTRAVENOUS

## 2019-01-06 SURGICAL SUPPLY — 39 items
BLADE CLIPPER SURG (BLADE) ×3 IMPLANT
CANISTER SUCT 3000ML PPV (MISCELLANEOUS) ×3 IMPLANT
CATH BALLN 4FR (CATHETERS) ×3 IMPLANT
CATH EMB LATEX FREE 5FRX80CM (CATHETERS) ×2
CATH EMB LF 5FRX80 (CATHETERS) ×1 IMPLANT
CATH LOADER DEPLOYMENT HUD (CATHETERS) ×3 IMPLANT
CONT SPEC 4OZ CLIKSEAL STRL BL (MISCELLANEOUS) ×3 IMPLANT
COVER BACK TABLE 60X90IN (DRAPES) ×3 IMPLANT
FORCEPS RADIAL JAW LRG 4 PULM (INSTRUMENTS) ×1 IMPLANT
GAUZE SPONGE 4X4 12PLY STRL (GAUZE/BANDAGES/DRESSINGS) ×3 IMPLANT
GLOVE SURG SS PI 7.5 STRL IVOR (GLOVE) ×3 IMPLANT
GOWN STRL REUS W/ TWL XL LVL3 (GOWN DISPOSABLE) ×3 IMPLANT
GOWN STRL REUS W/TWL XL LVL3 (GOWN DISPOSABLE) ×6
KIT AIRWAY SIZING HUD (KITS) ×3 IMPLANT
KIT CLEAN ENDO COMPLIANCE (KITS) ×3 IMPLANT
KIT TURNOVER KIT B (KITS) ×3 IMPLANT
MARKER SKIN DUAL TIP RULER LAB (MISCELLANEOUS) ×3 IMPLANT
NS IRRIG 1000ML POUR BTL (IV SOLUTION) ×3 IMPLANT
OIL SILICONE PENTAX (PARTS (SERVICE/REPAIRS)) ×3 IMPLANT
RADIAL JAW LRG 4 PULMONARY (INSTRUMENTS) ×2
STOPCOCK 4 WAY LG BORE MALE ST (IV SETS) ×3 IMPLANT
STOPCOCK MORSE 400PSI 3WAY (MISCELLANEOUS) ×3 IMPLANT
SYR 10ML LL (SYRINGE) ×3 IMPLANT
SYR 20ML ECCENTRIC (SYRINGE) ×3 IMPLANT
SYR 20ML LL LF (SYRINGE) ×3 IMPLANT
SYR 50ML LL SCALE MARK (SYRINGE) ×3 IMPLANT
TOWEL GREEN STERILE (TOWEL DISPOSABLE) ×3 IMPLANT
TOWEL GREEN STERILE FF (TOWEL DISPOSABLE) ×3 IMPLANT
TOWEL NATURAL 4PK STERILE (DISPOSABLE) ×3 IMPLANT
TUBE CONNECTING 20'X1/4 (TUBING) ×1
TUBE CONNECTING 20X1/4 (TUBING) ×2 IMPLANT
UNDERPAD 30X30 (UNDERPADS AND DIAPERS) ×3 IMPLANT
VALVE BIOPSY  SINGLE USE (MISCELLANEOUS) ×2
VALVE BIOPSY SINGLE USE (MISCELLANEOUS) ×1 IMPLANT
VALVE IN CARTRIDGE 6MM HUD (Valve) ×3 IMPLANT
VALVE IN CARTRIDGE 7MM HUD (Valve) ×3 IMPLANT
VALVE IN CARTRIDGE 9MM HUD (Valve) ×3 IMPLANT
VALVE SUCTION BRONCHIO DISP (MISCELLANEOUS) ×3 IMPLANT
WATER STERILE IRR 1000ML POUR (IV SOLUTION) ×3 IMPLANT

## 2019-01-06 NOTE — Anesthesia Procedure Notes (Signed)
Procedure Name: Intubation Date/Time: 01/06/2019 7:40 AM Performed by: Orlie Dakin, CRNA Pre-anesthesia Checklist: Patient identified, Emergency Drugs available, Suction available and Patient being monitored Patient Re-evaluated:Patient Re-evaluated prior to induction Oxygen Delivery Method: Circle system utilized Preoxygenation: Pre-oxygenation with 100% oxygen Induction Type: IV induction Laryngoscope Size: Flamenco and 3 Grade View: Grade I Tube type: Oral Tube size: 8.5 mm Number of attempts: 1 Airway Equipment and Method: Stylet Placement Confirmation: ETT inserted through vocal cords under direct vision,  positive ETCO2 and breath sounds checked- equal and bilateral Secured at: 25 cm Tube secured with: Tape Dental Injury: Teeth and Oropharynx as per pre-operative assessment  Comments: 4x4s bite block used.

## 2019-01-06 NOTE — Progress Notes (Signed)
During shift change patient had complaints of have chest suction on as she stated that Dr. Kipp Brood wanted the suction to staff off.  Patient became agitated as orders stated to have suction on. Paged Dr. Orvan Seen and given phone to remove suction.

## 2019-01-06 NOTE — Anesthesia Postprocedure Evaluation (Signed)
Anesthesia Post Note  Patient: Velita Quirk  Procedure(s) Performed: VIDEO BRONCHOSCOPY WITH INSERTION OF INTERBRONCHIAL VALVE (IBV) (N/A ) Pleuradesis - Chemical (N/A )     Patient location during evaluation: PACU Anesthesia Type: General Level of consciousness: awake Pain management: pain level controlled Vital Signs Assessment: post-procedure vital signs reviewed and stable Respiratory status: spontaneous breathing Cardiovascular status: stable Postop Assessment: no apparent nausea or vomiting Anesthetic complications: no    Last Vitals:  Vitals:   01/06/19 1032 01/06/19 1100  BP:  (!) 101/55  Pulse:  76  Resp:  19  Temp: 37.1 C   SpO2: 98%     Last Pain:  Vitals:   01/06/19 1032  TempSrc: Oral  PainSc: 0-No pain                 Hennie Gosa

## 2019-01-06 NOTE — Transfer of Care (Signed)
Immediate Anesthesia Transfer of Care Note  Patient: Jennifer [name]  Procedure(s) Performed: VIDEO BRONCHOSCOPY WITH INSERTION OF INTERBRONCHIAL VALVE (IBV) (N/A ) Pleuradesis - Chemical (N/A )  Patient Location: PACU  Anesthesia Type:General  Level of Consciousness: awake, oriented and patient cooperative  Airway & Oxygen Therapy: Patient Spontanous Breathing and Patient connected to face mask oxygen  Post-op Assessment: Report given to RN and Post -op Vital signs reviewed and stable  Post vital signs: Reviewed and stable  Last Vitals:  Vitals Value Taken Time  BP 110/38 01/06/19 0919  Temp    Pulse 87 01/06/19 0919  Resp 20 01/06/19 0919  SpO2 100 % 01/06/19 0919  Vitals shown include unvalidated device data.  Last Pain:  Vitals:   01/06/19 0500  TempSrc: Oral  PainSc:       Patients Stated Pain Goal: 0 (47/18/55 0158)  Complications: No apparent anesthesia complications

## 2019-01-06 NOTE — TOC Progression Note (Signed)
Transition of Care [facility]) - Progression Note    Patient Details  Name: Jennifer [name] MRN: [id number] Date of Birth: Dec 06, 1945  Transition [facility]) CM/SW Contact  Zenon Mayo, RN Phone Number: 01/06/2019, 7:17 PM  Clinical Narrative:    from home alone, s/p  Bronchoscopy Endobronchial valve placement   chest tube to suction. TOC team will continue to follow for needs.        Expected Discharge Plan and Services                                                 Social Determinants of Health (SDOH) Interventions    Readmission Risk Interventions No flowsheet data found.

## 2019-01-06 NOTE — Progress Notes (Signed)
Back from PACU  By bed awake and alert.

## 2019-01-06 NOTE — Op Note (Signed)
      [address]       [place] [address]             [phone]        01/06/2019  Patient:  Naveya Ellerman Pre-Op Dx: s/p Left upper lobectomy   Persistent air leak   Post-op Dx:  sam Procedure:  Bronchoscopy   Endobronchial valve placement in the basilar segments of the lower lobe.  58mm, 14mm   Betadine pleurodesis   Surgeon and Role:      * Danzell Birky, Lucile Crater, MD - Primary  Anesthesia  general EBL:  None  Blood Administration: none   Indications: 73 year old female who is 1 week status post left VATS lobectomy.  She has had a persistent air leak that has now resolved.  Findings: Intermittent 1 column air leak that appeared to improve after placement of endobronchial valve to the basilar segments.  Operative Technique: All invasive lines were placed in pre-op holding.  After the risks, benefits and alternatives were thoroughly discussed, the patient was brought to the operative theatre.  Anesthesia was induced, and the patient was prepped and draped in normal sterile fashion.  An appropriate surgical pause was performed.  The chest tube was then placed on suction.  There was a 1 column air leak present.    The Betadine was injected into the Pleur-evac system and allowed to dwell.  The flexible fiberoptic bronchoscopy was performed via the endotracheal tube.  It revealed normal endobronchial anatomy with no endobronchial lesions to the level of the subsegmental bronchi.  A Fogarty balloon catheter then was placed through the working port of the bronchoscope.  Occlusion of the lower lobe bronchus resulted in a slight of the air leak.  Occlusion of the individual smaller segmental bronchi resulted in slight appreciable change in the air leak.  The sizing balloon was placed and used to select appropriate valve sizes.  The bronchoscope then was directed to the posterior basilar segmental bronchi.  This measured for a 36mm valve, which was deployed.  A 68mm valve  was deployed in the lateral basilar segmental bronchi     After deploying the valves and waiting several minutes, the air leak decreased.  Final inspection was made with the bronchoscope and all valves appeared well seated.  There was no significant bleeding.  The bronchoscope was removed.  The patient was extubated in the operating room and taken to the [facility] in good condition.    The patient tolerated the procedure without any immediate complications, and was transferred to the PACU in good condition.  Addy Mcmannis Bary Leriche

## 2019-01-07 ENCOUNTER — Encounter (HOSPITAL_COMMUNITY): Payer: Self-pay | Admitting: Thoracic Surgery (Cardiothoracic Vascular Surgery)

## 2019-01-07 ENCOUNTER — Inpatient Hospital Stay (HOSPITAL_COMMUNITY): Payer: Medicare Other

## 2019-01-07 NOTE — Progress Notes (Addendum)
      [address]       [place] [address]             [phone]      1 Day Post-Op Procedure(s) (LRB): VIDEO BRONCHOSCOPY WITH INSERTION OF INTERBRONCHIAL VALVE (IBV) (N/A) Pleuradesis - Chemical (N/A) Subjective: Frustrated this morning. She wants to see her children. Only one has been visiting per policy.   Objective: Vital signs in last 24 hours: Temp:  [97.7 F (36.5 C)-98.7 F (37.1 C)] 98.2 F (36.8 C) (11/11 0738) Pulse Rate:  [76-101] 101 (11/11 0738) Cardiac Rhythm: Normal sinus rhythm (11/10 1900) Resp:  [16-20] 20 (11/11 0738) BP: (94-167)/(38-61) 167/55 (11/11 0738) SpO2:  [96 %-100 %] 100 % (11/11 0738)     Intake/Output from previous day: 11/10 0701 - 11/11 0700 In: 720 [P.O.:120; I.V.:600] Out: 30 [Chest Tube:30] Intake/Output this shift: No intake/output data recorded.  General appearance: alert, cooperative and no distress Heart: regular rate and rhythm, S1, S2 normal, no murmur, click, rub or gallop Lungs: clear to auscultation bilaterally Abdomen: soft, non-tender; bowel sounds normal; no masses,  no organomegaly Extremities: extremities normal, atraumatic, no cyanosis or edema Wound: clean and dry  Lab Results: Recent Labs    01/05/19 0212  WBC 8.3  HGB 10.4*  HCT 31.2*  PLT 241   BMET:  Recent Labs    01/05/19 0212  NA 135  K 4.2  CL 103  CO2 23  GLUCOSE 106*  BUN 15  CREATININE 0.69  CALCIUM 8.6*    PT/INR: No results for input(s): LABPROT, INR in the last 72 hours. ABG    Component Value Date/Time   PHART 7.415 12/30/2018 0356   HCO3 26.9 12/30/2018 0356   O2SAT 95.5 12/30/2018 0356   CBG (last 3)  No results for input(s): GLUCAP in the last 72 hours.  Assessment/Plan: S/P Procedure(s) (LRB): VIDEO BRONCHOSCOPY WITH INSERTION OF INTERBRONCHIAL VALVE (IBV) (N/A) Pleuradesis - Chemical (N/A)  1. CV-BP climbing-on 40 lisinopril. Continue lipitor. ASA allergy 2. Pulm-tolerating room air with good  oxygen saturation 3. Chest tube-minimum output.  + air leak. Subq Air around her incisions, chest tube and extending into her neck bilaterally. She is able to swallow without issue. She states it is annoying if anything 4. H and H stable   Plan: Air leak remains an issue. CT to water seal at the moment. Encouraged use of IS and flutter valve. Encouraged ambulation. Tramadol for pain control.    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

## 2019-01-07 NOTE — Progress Notes (Addendum)
Taken care of the patient from 3pm to continuity of care, vitals taken and recorded, latest chest tube output recorded, no any complaints of dyspnea and distress from pt side, will continue to monitor the patient  [name], RN

## 2019-01-07 NOTE — Progress Notes (Signed)
RN noticed pt does not have any IV lines, talked with pt, she said it was taken out yesterday, RN put IV team consult.  [name], RN

## 2019-01-08 ENCOUNTER — Inpatient Hospital Stay (HOSPITAL_COMMUNITY): Payer: Medicare Other

## 2019-01-08 ENCOUNTER — Other Ambulatory Visit: Payer: Self-pay | Admitting: *Deleted

## 2019-01-08 NOTE — Progress Notes (Signed)
The purposed treatment discussed at cancer conference 01/08/19 is for discussion purpose only and is not a binding recommendation.  The patient was not physically examined nor present for their treatment options.  Therefor, final treatment plans cannot be decided.

## 2019-01-08 NOTE — Progress Notes (Signed)
      [address]       [place] [address]             [phone]      2 Days Post-Op Procedure(s) (LRB): VIDEO BRONCHOSCOPY WITH INSERTION OF INTERBRONCHIAL VALVE (IBV) (N/A) Pleuradesis - Chemical (N/A) Subjective: She feels discouraged about her persistent air leak.   Objective: Vital signs in last 24 hours: Temp:  [97.9 F (36.6 C)-98.5 F (36.9 C)] 97.9 F (36.6 C) (11/12 0403) Pulse Rate:  [76-96] 76 (11/12 0403) Cardiac Rhythm: Normal sinus rhythm (11/11 1900) Resp:  [15-22] 15 (11/12 0403) BP: (121-143)/(50-66) 128/66 (11/12 0403) SpO2:  [95 %-100 %] 95 % (11/12 0403)     Intake/Output from previous day: 11/11 0701 - 11/12 0700 In: 1015 [P.O.:1015] Out: 180 [Chest Tube:180] Intake/Output this shift: No intake/output data recorded.  General appearance: alert, cooperative and no distress Heart: regular rate and rhythm, S1, S2 normal, no murmur, click, rub or gallop Lungs: clear to auscultation bilaterally and vibration heard in the chest wall from the air leak Abdomen: soft, non-tender; bowel sounds normal; no masses,  no organomegaly Extremities: extremities normal, atraumatic, no cyanosis or edema Wound: clean and dry  Lab Results: No results for input(s): WBC, HGB, HCT, PLT in the last 72 hours. BMET: No results for input(s): NA, K, CL, CO2, GLUCOSE, BUN, CREATININE, CALCIUM in the last 72 hours.  PT/INR: No results for input(s): LABPROT, INR in the last 72 hours. ABG    Component Value Date/Time   PHART 7.415 12/30/2018 0356   HCO3 26.9 12/30/2018 0356   O2SAT 95.5 12/30/2018 0356   CBG (last 3)  No results for input(s): GLUCAP in the last 72 hours.  Assessment/Plan: S/P Procedure(s) (LRB): VIDEO BRONCHOSCOPY WITH INSERTION OF INTERBRONCHIAL VALVE (IBV) (N/A) Pleuradesis - Chemical (N/A)   1. CV-BP climbing-on 40 lisinopril. Continue lipitor. ASA allergy 2. Pulm-tolerating room air with good oxygen saturation 3. Chest  tube-minimum output (180cc).  + air leak. Subq Air around her incisions, chest tube and extending into her neck bilaterally. She is able to swallow without issue. She states it is annoying if anything 4. H and H stable 5. Renal-creatinine 0.69, electrolytes okay   Plan: Air leak remains an issue. CT on 10cm for subq air. I feel like the neck subq air has improved today. CXR states it remains the same. Hopefully can remove chest tube soon.     LOS: 10 days    Elgie Collard 01/08/2019

## 2019-01-08 NOTE — Progress Notes (Signed)
Patient ambulated 469ft around the unit. Patient tolerated well. No distress noted. Vital signs within normal limits.  Chest tube placed back to suction.

## 2019-01-09 ENCOUNTER — Inpatient Hospital Stay (HOSPITAL_COMMUNITY): Payer: Medicare Other

## 2019-01-09 LAB — TYPE AND SCREEN
ABO/RH(D): A POS
Antibody Screen: NEGATIVE
Unit division: 0
Unit division: 0

## 2019-01-09 LAB — BPAM RBC
Blood Product Expiration Date: 202011282359
Blood Product Expiration Date: 202011282359
ISSUE DATE / TIME: 202010270855
ISSUE DATE / TIME: 202010270855
Unit Type and Rh: 6200
Unit Type and Rh: 6200

## 2019-01-09 MED ORDER — GUAIFENESIN ER 600 MG PO TB12
600.0000 mg | ORAL_TABLET | Freq: Two times a day (BID) | ORAL | Status: DC
  Administered 2019-01-09 – 2019-01-21 (×21): 600 mg via ORAL
  Filled 2019-01-09 (×23): qty 1

## 2019-01-09 NOTE — Progress Notes (Addendum)
      [address]       [place] [address]             [phone]      3 Days Post-Op Procedure(s) (LRB): VIDEO BRONCHOSCOPY WITH INSERTION OF INTERBRONCHIAL VALVE (IBV) (N/A) Pleuradesis - Chemical (N/A) Subjective: She is in better spirits this morning. She does however continue to be frustrated with her disposition.   Objective: Vital signs in last 24 hours: Temp:  [97.4 F (36.3 C)-99.1 F (37.3 C)] 98.2 F (36.8 C) (11/13 0745) Pulse Rate:  [60-98] 64 (11/13 0320) Cardiac Rhythm: Sinus tachycardia (11/13 0743) Resp:  [17-29] 19 (11/13 0320) BP: (94-147)/(52-71) 120/52 (11/13 0331) SpO2:  [95 %-99 %] 97 % (11/13 0745)    Intake/Output from previous day: 11/12 0701 - 11/13 0700 In: 240 [P.O.:240] Out: 210 [Chest Tube:210] Intake/Output this shift: No intake/output data recorded.  General appearance: alert, cooperative and no distress Heart: regular rate and rhythm, S1, S2 normal, no murmur, click, rub or gallop Lungs: clear to auscultation bilaterally Abdomen: soft, non-tender; bowel sounds normal; no masses,  no organomegaly Extremities: extremities normal, atraumatic, no cyanosis or edema Wound: clean and dry  Lab Results: No results for input(s): WBC, HGB, HCT, PLT in the last 72 hours. BMET: No results for input(s): NA, K, CL, CO2, GLUCOSE, BUN, CREATININE, CALCIUM in the last 72 hours.  PT/INR: No results for input(s): LABPROT, INR in the last 72 hours. ABG    Component Value Date/Time   PHART 7.415 12/30/2018 0356   HCO3 26.9 12/30/2018 0356   O2SAT 95.5 12/30/2018 0356   CBG (last 3)  No results for input(s): GLUCAP in the last 72 hours.  Assessment/Plan: S/P Procedure(s) (LRB): VIDEO BRONCHOSCOPY WITH INSERTION OF INTERBRONCHIAL VALVE (IBV) (N/A) Pleuradesis - Chemical (N/A)   1. CV-BP climbing-on 40 lisinopril. Continue lipitor. ASA allergy 2. Pulm-tolerating room air with good oxygen saturation 3. Chest tube-minimum output  (210cc).Drainage is brownish but serosanguineous + air leak. Subq Air around her incisions, chest tube and extending into her neck bilaterally but improved compared to the other day. She is able to swallow without issue.  4. H and H stable 5. Renal-creatinine 0.69, electrolytes okay  Plan: CXR stable and reviewed with the patient. Will try on water seal today and I ordered a CXR for the morning. Will also order mucinex for secretions.    LOS: 11 days    Elgie Collard 01/09/2019  Sub q air decreased Tubes checked , no kinks, good position on chest xray Lung up, will try on water seal today, patient aware to call if increaswed sub q air  Follow up chest xray in am I have seen and examined Jennifer [name] and agree with the above assessment  and plan.  Grace Isaac MD Beeper [phone] Office [phone] 01/09/2019 9:03 AM

## 2019-01-09 NOTE — Care Management Important Message (Signed)
Important Message  Patient Details  Name: Jennifer [name] MRN: [id number] Date of Birth: [birth year]   Medicare Important Message Given:  Yes     Memory Argue 01/09/2019, 2:25 PM

## 2019-01-10 ENCOUNTER — Inpatient Hospital Stay (HOSPITAL_COMMUNITY): Payer: Medicare Other

## 2019-01-10 NOTE — Progress Notes (Addendum)
      [address]       [place] [address]             [phone]         [address]       [place] [address]             [phone]      4 Days Post-Op Procedure(s) (LRB): VIDEO BRONCHOSCOPY WITH INSERTION OF INTERBRONCHIAL VALVE (IBV) (N/A) Pleuradesis - Chemical (N/A)   Subjective:  Patient continues to be upset and frustrated.    Objective: Vital signs in last 24 hours: Temp:  [98.2 F (36.8 C)-99.2 F (37.3 C)] 98.6 F (37 C) (11/14 0732) Pulse Rate:  [75-95] 95 (11/14 0732) Cardiac Rhythm: Normal sinus rhythm (11/14 0800) Resp:  [15-22] 20 (11/14 0732) BP: (110-133)/(55-66) 110/59 (11/14 0732) SpO2:  [97 %-99 %] 98 % (11/14 0357)  Intake/Output from previous day: 11/13 0701 - 11/14 0700 In: 320 [P.O.:320] Out: 50 [Chest Tube:50] Intake/Output this shift: Total I/O In: 240 [P.O.:240] Out: -   General appearance: alert, cooperative and no distress Heart: regular rate and rhythm Lungs: clear to auscultation bilaterally Abdomen: soft, non-tender; bowel sounds normal; no masses,  no organomegaly Wound: clean and dry  Lab Results: No results for input(s): WBC, HGB, HCT, PLT in the last 72 hours. BMET: No results for input(s): NA, K, CL, CO2, GLUCOSE, BUN, CREATININE, CALCIUM in the last 72 hours.  PT/INR: No results for input(s): LABPROT, INR in the last 72 hours. ABG    Component Value Date/Time   PHART 7.415 12/30/2018 0356   HCO3 26.9 12/30/2018 0356   O2SAT 95.5 12/30/2018 0356   CBG (last 3)  No results for input(s): GLUCAP in the last 72 hours.  Assessment/Plan: S/P Procedure(s) (LRB): VIDEO BRONCHOSCOPY WITH INSERTION OF INTERBRONCHIAL VALVE (IBV) (N/A) Pleuradesis - Chemical (N/A)  1. CV- NSR, BP controlled on Lisinopril 2. Pulm- persistent air leak, its 6-7+ with cough, new dressing placed.. currently chest tube on water seal.. CXR this morning showed stable appearance of pneumothorax and no new  increase in sub q emphysema 3. Dispo- patient remains frustrated over persistent air leak, her CXR is stable with chest tube on water seal, will repeat CXR in AM....   LOS: 12 days    Ellwood Handler, PA-C  01/10/2019   Chart reviewed, patient examined, agree with above. CXR looks good. There is still a 6+ air leak with cough on water seal. Continue to water seal and will discuss mini express with Dr. Kipp Brood.

## 2019-01-10 NOTE — Progress Notes (Signed)
Bartle MD notified regarding patient having left upper chest swollen, air crackle and discomfort. Order received to connect patient to wall suction at -20 cm.  Patient tolerated well with the transition. MD also notified that patient has a persistent large air leak with regular respiration.No further order at this time.  Will keep patient on wall suction and continue to monitor.

## 2019-01-11 ENCOUNTER — Inpatient Hospital Stay (HOSPITAL_COMMUNITY): Payer: Medicare Other

## 2019-01-11 NOTE — Progress Notes (Signed)
5 Days Post-Op Procedure(s) (LRB): VIDEO BRONCHOSCOPY WITH INSERTION OF INTERBRONCHIAL VALVE (IBV) (N/A) Pleuradesis - Chemical (N/A) Subjective: No chest pain or shortness of breath. Anxious about the air leak and receiving conflicting messages from the nurses and medical staff.  I was called by nurse last night that she was developing more left anterior chest wall subcutaneous emphysema on water seal so tube put back to 20 cm suction.  Objective: Vital signs in last 24 hours: Temp:  [98.2 F (36.8 C)-98.7 F (37.1 C)] 98.2 F (36.8 C) (11/15 0757) Pulse Rate:  [73-96] 86 (11/15 0757) Cardiac Rhythm: Normal sinus rhythm (11/15 0757) Resp:  [16-25] 23 (11/15 0757) BP: (98-137)/(45-75) 124/61 (11/15 0757) SpO2:  [97 %-99 %] 97 % (11/15 0757)  Hemodynamic parameters for last 24 hours:    Intake/Output from previous day: 11/14 0701 - 11/15 0700 In: 830 [P.O.:830] Out: 101 [Chest Tube:101] Intake/Output this shift: Total I/O In: 400 [P.O.:400] Out: -   General appearance: alert and cooperative Heart: regular rate and rhythm, S1, S2 normal, no murmur, click, rub or gallop Lungs: clear to auscultation bilaterally Increased subcutaneous emphysema left anterior chest wall compared to yesterday but she says no change overnight. Wound: incisions ok chest tube with mostly 1+ air leak with each breath on 20 cm suction. Occasionally 3-4+.  Lab Results: No results for input(s): WBC, HGB, HCT, PLT in the last 72 hours. BMET: No results for input(s): NA, K, CL, CO2, GLUCOSE, BUN, CREATININE, CALCIUM in the last 72 hours.  PT/INR: No results for input(s): LABPROT, INR in the last 72 hours. ABG    Component Value Date/Time   PHART 7.415 12/30/2018 0356   HCO3 26.9 12/30/2018 0356   O2SAT 95.5 12/30/2018 0356   CBG (last 3)  No results for input(s): GLUCAP in the last 72 hours.  CLINICAL DATA:  Follow-up chest tube.  EXAM: PORTABLE CHEST 1 VIEW  COMPARISON:   01/10/2019  FINDINGS: There is a left-sided chest tube in place. Unchanged appearance of small left apical pneumothorax. Normal heart size. No pleural effusion. No airspace consolidation.  IMPRESSION: 1. Unchanged small left apical pneumothorax with chest tube in place.   Electronically Signed   By: Kerby Moors M.D.   On: 01/11/2019 06:41  Assessment/Plan: S/P Procedure(s) (LRB): VIDEO BRONCHOSCOPY WITH INSERTION OF INTERBRONCHIAL VALVE (IBV) (N/A) Pleuradesis - Chemical (N/A)  She is doing well but still has an air leak with every breath and developed more subcutaneous emphysema on water seal. Will decrease suction to 10 cm and observe today. Repeat CXR in am.   LOS: 13 days    Gaye Pollack 01/11/2019

## 2019-01-11 NOTE — Progress Notes (Signed)
Rt. Chest tube suction was changed from -20cm to -10 cm in the morning. Patient was okay most of day time then she felt more tension on Rt. Chest and neck area. Increased moderate amount of emphysema around Lt. Chest, neck, Rt. Shoulder & chest. Notified PA Erin regarding this matter and put on the suction back to - 20 cm. Continue to monitor. HS [employer]

## 2019-01-12 ENCOUNTER — Inpatient Hospital Stay (HOSPITAL_COMMUNITY): Payer: Medicare Other

## 2019-01-12 NOTE — Progress Notes (Addendum)
      [address]       [place] [address]             [phone]      6 Days Post-Op Procedure(s) (LRB): VIDEO BRONCHOSCOPY WITH INSERTION OF INTERBRONCHIAL VALVE (IBV) (N/A) Pleuradesis - Chemical (N/A) Subjective: No issues overnight.   Objective: Vital signs in last 24 hours: Temp:  [98.2 F (36.8 C)-98.6 F (37 C)] 98.3 F (36.8 C) (11/16 0736) Pulse Rate:  [67-96] 88 (11/16 0736) Cardiac Rhythm: Normal sinus rhythm (11/16 0700) Resp:  [16-25] 20 (11/16 0736) BP: (95-134)/(54-68) 134/62 (11/16 0736) SpO2:  [97 %-99 %] 97 % (11/16 0736)     Intake/Output from previous day: 11/15 0701 - 11/16 0700 In: 990 [P.O.:990] Out: 198 [Chest Tube:198] Intake/Output this shift: No intake/output data recorded.  General appearance: alert, cooperative and no distress Heart: regular rate and rhythm, S1, S2 normal, no murmur, click, rub or gallop Lungs: clear to auscultation bilaterally Abdomen: soft, non-tender; bowel sounds normal; no masses,  no organomegaly Extremities: extremities normal, atraumatic, no cyanosis or edema Wound: clean and dry  Lab Results: No results for input(s): WBC, HGB, HCT, PLT in the last 72 hours. BMET: No results for input(s): NA, K, CL, CO2, GLUCOSE, BUN, CREATININE, CALCIUM in the last 72 hours.  PT/INR: No results for input(s): LABPROT, INR in the last 72 hours. ABG    Component Value Date/Time   PHART 7.415 12/30/2018 0356   HCO3 26.9 12/30/2018 0356   O2SAT 95.5 12/30/2018 0356   CBG (last 3)  No results for input(s): GLUCAP in the last 72 hours.  Assessment/Plan: S/P Procedure(s) (LRB): VIDEO BRONCHOSCOPY WITH INSERTION OF INTERBRONCHIAL VALVE (IBV) (N/A) Pleuradesis - Chemical (N/A)  1. + air leak remains. Chest tube is on 20cm of suction. Subcutaneous emphysema appears stable extending to the left shoulder down to the chest tube site. No air in the neck. Patient is comfortable and on room air with oxygen  saturation of 97%.  2. NSR, BP well controlled. Continue medication regimen 3. Creatinine has been stable.   Plan: Dr. Kipp Brood plans to take the patient to the OR tomorrow for additional placement of left lower lobe endobronchial valves. NPO order placed.    LOS: 14 days    Elgie Collard 01/12/2019   Agree with above OR tomorrow of EBV placement  Tredarius Cobern O Arinze Rivadeneira

## 2019-01-12 NOTE — Progress Notes (Signed)
Nutrition Brief Note  Patient identified for LOS (day #14).  Wt Readings from Last 15 Encounters:  12/29/18 71 kg  12/25/18 71 kg  12/22/18 70.3 kg  12/09/18 70.6 kg  12/05/18 70.6 kg  04/04/16 66 kg  04/15/15 68 kg  05/27/13 68.5 kg  12/20/11 68.9 kg  12/18/11 70.3 kg  12/11/11 70.3 kg  10/15/11 69.6 kg    Body mass index is 23.11 kg/m. Patient meets criteria for normal weight based on current BMI.   Current diet order is Heart Healthy and patient has been eating 100% of all meals over the past 4 days. Plan for her to be made NPO at midnight tonight. Labs and medications reviewed.   She is s/p insertion of interbronchial valve with plan for additional placement of LLL endobronchial valve on 11/17.  No nutrition interventions warranted at this time. If nutrition issues arise, please consult RD.     Jarome Matin, MS, RD, LDN, [facility] Inpatient Clinical Dietitian Pager # [phone] After hours/weekend pager # [phone]

## 2019-01-13 ENCOUNTER — Inpatient Hospital Stay (HOSPITAL_COMMUNITY): Payer: Medicare Other | Admitting: Anesthesiology

## 2019-01-13 ENCOUNTER — Encounter (HOSPITAL_COMMUNITY)
Admission: RE | Disposition: A | Discharge status: Home / Self Care | Payer: Self-pay | Source: Home / Self Care | Attending: Thoracic Surgery (Cardiothoracic Vascular Surgery)

## 2019-01-13 ENCOUNTER — Encounter (HOSPITAL_COMMUNITY): Payer: Self-pay | Admitting: Orthopedic Surgery

## 2019-01-13 DIAGNOSIS — J95812 Postprocedural air leak: Secondary | ICD-10-CM

## 2019-01-13 HISTORY — PX: VIDEO BRONCHOSCOPY WITH INSERTION OF INTERBRONCHIAL VALVE (IBV): SHX6178

## 2019-01-13 SURGERY — BRONCHOSCOPY, FLEXIBLE, WITH INTRABRONCHIAL VALVE INSERTION
Anesthesia: General

## 2019-01-13 MED ORDER — 0.9 % SODIUM CHLORIDE (POUR BTL) OPTIME
TOPICAL | Status: DC | PRN
  Administered 2019-01-13: 1000 mL

## 2019-01-13 MED ORDER — FENTANYL CITRATE (PF) 250 MCG/5ML IJ SOLN
INTRAMUSCULAR | Status: DC | PRN
  Administered 2019-01-13: 100 ug via INTRAVENOUS

## 2019-01-13 MED ORDER — EPINEPHRINE PF 1 MG/ML IJ SOLN
INTRAMUSCULAR | Status: AC
  Filled 2019-01-13: qty 1

## 2019-01-13 MED ORDER — LIDOCAINE HCL (CARDIAC) PF 100 MG/5ML IV SOSY
PREFILLED_SYRINGE | INTRAVENOUS | Status: DC | PRN
  Administered 2019-01-13: 80 mg via INTRATRACHEAL

## 2019-01-13 MED ORDER — TALC (STERITALC) POWDER FOR INTRAPLEURAL USE
INTRAPLEURAL | Status: AC
  Filled 2019-01-13: qty 4

## 2019-01-13 MED ORDER — PHENYLEPHRINE 40 MCG/ML (10ML) SYRINGE FOR IV PUSH (FOR BLOOD PRESSURE SUPPORT)
PREFILLED_SYRINGE | INTRAVENOUS | Status: DC | PRN
  Administered 2019-01-13 (×3): 80 ug via INTRAVENOUS
  Administered 2019-01-13: 40 ug via INTRAVENOUS

## 2019-01-13 MED ORDER — LACTATED RINGERS IV SOLN
INTRAVENOUS | Status: DC
  Administered 2019-01-13: 16:00:00 via INTRAVENOUS

## 2019-01-13 MED ORDER — FENTANYL CITRATE (PF) 250 MCG/5ML IJ SOLN
INTRAMUSCULAR | Status: AC
  Filled 2019-01-13: qty 5

## 2019-01-13 MED ORDER — MIDAZOLAM HCL 2 MG/2ML IJ SOLN
INTRAMUSCULAR | Status: AC
  Filled 2019-01-13: qty 2

## 2019-01-13 MED ORDER — ONDANSETRON HCL 4 MG/2ML IJ SOLN
4.0000 mg | Freq: Once | INTRAMUSCULAR | Status: AC | PRN
  Administered 2019-01-13: 4 mg via INTRAVENOUS

## 2019-01-13 MED ORDER — PROPOFOL 10 MG/ML IV BOLUS
INTRAVENOUS | Status: DC | PRN
  Administered 2019-01-13: 50 mg via INTRAVENOUS
  Administered 2019-01-13: 120 mg via INTRAVENOUS

## 2019-01-13 MED ORDER — MIDAZOLAM HCL 2 MG/2ML IJ SOLN
INTRAMUSCULAR | Status: DC | PRN
  Administered 2019-01-13: 1 mg via INTRAVENOUS

## 2019-01-13 MED ORDER — DEXAMETHASONE SODIUM PHOSPHATE 10 MG/ML IJ SOLN
INTRAMUSCULAR | Status: DC | PRN
  Administered 2019-01-13: 10 mg via INTRAVENOUS

## 2019-01-13 MED ORDER — OXYCODONE HCL 5 MG/5ML PO SOLN: 5.0000 mg | Freq: Once | ORAL | Status: DC | PRN

## 2019-01-13 MED ORDER — PHENYLEPHRINE HCL-NACL 10-0.9 MG/250ML-% IV SOLN
INTRAVENOUS | Status: DC | PRN
  Administered 2019-01-13: 40 ug/min via INTRAVENOUS

## 2019-01-13 MED ORDER — SUGAMMADEX SODIUM 200 MG/2ML IV SOLN
INTRAVENOUS | Status: DC | PRN
  Administered 2019-01-13: 150 mg via INTRAVENOUS

## 2019-01-13 MED ORDER — ROCURONIUM BROMIDE 100 MG/10ML IV SOLN
INTRAVENOUS | Status: DC | PRN
  Administered 2019-01-13: 50 mg via INTRAVENOUS

## 2019-01-13 MED ORDER — OXYCODONE HCL 5 MG PO TABS: 5.0000 mg | ORAL_TABLET | Freq: Once | ORAL | Status: DC | PRN

## 2019-01-13 MED ORDER — FENTANYL CITRATE (PF) 100 MCG/2ML IJ SOLN: 25.0000 ug | INTRAMUSCULAR | Status: DC | PRN

## 2019-01-13 SURGICAL SUPPLY — 43 items
ADAPTER VALVE BIOPSY EBUS (MISCELLANEOUS) IMPLANT
ADPTR VALVE BIOPSY EBUS (MISCELLANEOUS)
BLADE CLIPPER SURG (BLADE) IMPLANT
CANISTER SUCT 3000ML PPV (MISCELLANEOUS) ×2 IMPLANT
CATH BALLN 4FR (CATHETERS) ×2 IMPLANT
CATH LOADER DEPLOYMENT HUD (CATHETERS) ×2 IMPLANT
CONT SPEC 4OZ CLIKSEAL STRL BL (MISCELLANEOUS) ×2 IMPLANT
COVER BACK TABLE 60X90IN (DRAPES) ×2 IMPLANT
COVER WAND RF STERILE (DRAPES) IMPLANT
FILTER STRAW FLUID ASPIR (MISCELLANEOUS) IMPLANT
FORCEPS BIOP RJ4 1.8 (CUTTING FORCEPS) IMPLANT
GAUZE SPONGE 4X4 12PLY STRL (GAUZE/BANDAGES/DRESSINGS) ×2 IMPLANT
GLOVE BIO SURGEON STRL SZ7 (GLOVE) ×2 IMPLANT
GLOVE BIO SURGEON STRL SZ7.5 (GLOVE) ×4 IMPLANT
GLOVE BIOGEL PI IND STRL 6.5 (GLOVE) ×2 IMPLANT
GLOVE BIOGEL PI INDICATOR 6.5 (GLOVE) ×2
GOWN STRL REUS W/ TWL LRG LVL3 (GOWN DISPOSABLE) ×1 IMPLANT
GOWN STRL REUS W/ TWL XL LVL3 (GOWN DISPOSABLE) ×1 IMPLANT
GOWN STRL REUS W/TWL LRG LVL3 (GOWN DISPOSABLE) ×1
GOWN STRL REUS W/TWL XL LVL3 (GOWN DISPOSABLE) ×1
KIT AIRWAY SIZING HUD (KITS) ×2 IMPLANT
KIT CLEAN ENDO COMPLIANCE (KITS) ×2 IMPLANT
KIT TURNOVER KIT B (KITS) ×2 IMPLANT
MARKER SKIN DUAL TIP RULER LAB (MISCELLANEOUS) ×2 IMPLANT
NS IRRIG 1000ML POUR BTL (IV SOLUTION) ×2 IMPLANT
OIL SILICONE PENTAX (PARTS (SERVICE/REPAIRS)) ×2 IMPLANT
PAD ARMBOARD 7.5X6 YLW CONV (MISCELLANEOUS) ×4 IMPLANT
STOPCOCK MORSE 400PSI 3WAY (MISCELLANEOUS) ×2 IMPLANT
SYR 10ML LL (SYRINGE) ×2 IMPLANT
SYR 20ML ECCENTRIC (SYRINGE) ×2 IMPLANT
SYR 50ML LL SCALE MARK (SYRINGE) ×2 IMPLANT
TOWEL GREEN STERILE (TOWEL DISPOSABLE) ×2 IMPLANT
TOWEL GREEN STERILE FF (TOWEL DISPOSABLE) IMPLANT
TOWEL NATURAL 4PK STERILE (DISPOSABLE) ×2 IMPLANT
TRAP SPECIMEN MUCOUS 40CC (MISCELLANEOUS) ×2 IMPLANT
TUBE CONNECTING 20X1/4 (TUBING) ×2 IMPLANT
UNDERPAD 30X30 (UNDERPADS AND DIAPERS) ×2 IMPLANT
VALVE BIOPSY  SINGLE USE (MISCELLANEOUS) ×1
VALVE BIOPSY SINGLE USE (MISCELLANEOUS) ×1 IMPLANT
VALVE IN CARTRIDGE 6MM HUD (Valve) ×2 IMPLANT
VALVE IN CARTRIDGE 7MM HUD (Valve) ×2 IMPLANT
VALVE SUCTION BRONCHIO DISP (MISCELLANEOUS) ×2 IMPLANT
WATER STERILE IRR 1000ML POUR (IV SOLUTION) ×2 IMPLANT

## 2019-01-13 NOTE — Anesthesia Procedure Notes (Signed)
Procedure Name: Intubation Date/Time: 01/13/2019 5:07 PM Performed by: Shirlyn Goltz, CRNA Pre-anesthesia Checklist: Patient identified, Emergency Drugs available, Suction available and Patient being monitored Patient Re-evaluated:Patient Re-evaluated prior to induction Oxygen Delivery Method: Circle system utilized Preoxygenation: Pre-oxygenation with 100% oxygen Induction Type: IV induction Ventilation: Mask ventilation without difficulty Laryngoscope Size: Mac and 4 Grade View: Grade I Tube type: Oral Tube size: 8.5 mm Number of attempts: 1 Airway Equipment and Method: Stylet Placement Confirmation: ETT inserted through vocal cords under direct vision,  positive ETCO2 and breath sounds checked- equal and bilateral Secured at: 20 cm Tube secured with: Tape Dental Injury: Teeth and Oropharynx as per pre-operative assessment

## 2019-01-13 NOTE — Transfer of Care (Signed)
Immediate Anesthesia Transfer of Care Note  Patient: Jennifer [name]  Procedure(s) Performed: VIDEO BRONCHOSCOPY WITH INSERTION OF INTERBRONCHIAL VALVE (IBV) (N/A )  Patient Location: PACU  Anesthesia Type:General  Level of Consciousness: awake, alert , oriented and patient cooperative  Airway & Oxygen Therapy: Patient Spontanous Breathing  Post-op Assessment: Report given to RN and Post -op Vital signs reviewed and stable  Post vital signs: Reviewed and stable  Last Vitals:  Vitals Value Taken Time  BP 107/44 01/13/19 1805  Temp    Pulse 79 01/13/19 1806  Resp 19 01/13/19 1806  SpO2 98 % 01/13/19 1806  Vitals shown include unvalidated device data.  Last Pain:  Vitals:   01/13/19 1108  TempSrc: Oral  PainSc:       Patients Stated Pain Goal: 0 (16/83/87 0658)  Complications: No apparent anesthesia complications

## 2019-01-13 NOTE — Op Note (Signed)
      [address]       [place] [address]             [phone]        01/13/2019  Patient:  Jennifer [name] Pre-Op Dx: persistent air leak   Post-op Dx:  same Procedure:   Bronchoscopy   Endobronchial valve placement in left segmental bronchi   4mm superior segment, and 15mm anteromedial segment   Surgeon and Role:      * Jacquline Terrill, Lucile Crater, MD - Primary Anesthesia  general EBL:  Minimal  Blood Administration: none   Indications: 73 yo female s/p left upper lobectomy with persistent air leak.  She has had endobronchial valves placed before without significant improvement Findings: Good positioning of valve.  Airleak improved slightly, but did not completely resolve  Operative Technique: All invasive lines were placed in pre-op holding.  After the risks, benefits and alternatives were thoroughly discussed, the patient was brought to the operative theatre.  Anesthesia was induced, and the patient was prepped and draped in normal sterile fashion.  An appropriate surgical pause was performed.  The chest tube was then placed on suction.  There was a 1column air leak present.  Flexible fiberoptic bronchoscopy was performed via the endotracheal tube.  It revealed normal endobronchial anatomy with no endobronchial lesions to the level of the subsegmental bronchi.  A Fogarty balloon catheter then was placed through the working port of the bronchoscope.   The sizing balloon was placed and used to select appropriate valve sizes.  The bronchoscope then was directed to the superior segment, and anteromedial segmental bronchi.  This measured for a 19mm, and 93mm valve respectively, which was deployed.     After deploying the valves and waiting several minutes, the air leak decreased.  Final inspection was made with the bronchoscope and all valves appeared well seated.  There was no significant bleeding.  The bronchoscope was removed.  The patient was extubated in the operating  room and taken to the [facility] in good condition.    The patient tolerated the procedure without any immediate complications, and was transferred to the PACU in good condition.  Armentha Branagan Bary Leriche

## 2019-01-13 NOTE — Anesthesia Preprocedure Evaluation (Addendum)
Anesthesia Evaluation  Patient identified by MRN, date of birth, ID band Patient awake    Reviewed: Allergy & Precautions, NPO status , Patient's Chart, lab work & pertinent test results  History of Anesthesia Complications Negative for: history of anesthetic complications  Airway Mallampati: II  TM Distance: >3 FB Neck ROM: Full    Dental  (+) Dental Advisory Given, Teeth Intact   Pulmonary Patient abstained from smoking., former smoker,   Hx PTX    Pulmonary exam normal        Cardiovascular hypertension, Pt. on medications (-) anginaNormal cardiovascular exam     Neuro/Psych negative neurological ROS  negative psych ROS   GI/Hepatic negative GI ROS, Neg liver ROS,   Endo/Other   Pre-DM   Renal/GU negative Renal ROS     Musculoskeletal negative musculoskeletal ROS (+)   Abdominal   Peds  Hematology  (+) anemia ,   Anesthesia Other Findings   Reproductive/Obstetrics                            Anesthesia Physical Anesthesia Plan  ASA: III  Anesthesia Plan: General   Post-op Pain Management:    Induction: Intravenous  PONV Risk Score and Plan: 3 and Treatment may vary due to age or medical condition, Ondansetron and Dexamethasone  Airway Management Planned: Oral ETT  Additional Equipment: None  Intra-op Plan:   Post-operative Plan: Extubation in OR  Informed Consent: I have reviewed the patients History and Physical, chart, labs and discussed the procedure including the risks, benefits and alternatives for the proposed anesthesia with the patient or authorized representative who has indicated his/her understanding and acceptance.     Dental advisory given  Plan Discussed with: CRNA and Anesthesiologist  Anesthesia Plan Comments:         Anesthesia Quick Evaluation

## 2019-01-13 NOTE — Progress Notes (Signed)
     [address]       [place] [address]             [phone]        No events overnight Today's Vitals   01/13/19 0036 01/13/19 0407 01/13/19 0500 01/13/19 0757  BP:  (!) 114/56  (!) 122/59  Pulse:  64  71  Resp:  17  (!) 21  Temp:  97.6 F (36.4 C)  98.3 F (36.8 C)  TempSrc:  Oral  Oral  SpO2:  98%  99%  Weight:      Height:      PainSc: 0-No pain  0-No pain    Body mass index is 23.11 kg/m. Alert NAD RRR EWOB 1+ leak on pleurovac  POD 15 s/p L VATS, LULectomy, now with prolonged air leak  OR today for bronchoscopy EBV in remaining segments of lower lobe, and chemical pleurodesis

## 2019-01-14 ENCOUNTER — Encounter (HOSPITAL_COMMUNITY): Payer: Self-pay | Admitting: Thoracic Surgery (Cardiothoracic Vascular Surgery)

## 2019-01-14 ENCOUNTER — Inpatient Hospital Stay (HOSPITAL_COMMUNITY): Payer: Medicare Other

## 2019-01-14 MED ORDER — ROCURONIUM BROMIDE 10 MG/ML (PF) SYRINGE
PREFILLED_SYRINGE | INTRAVENOUS | Status: AC
  Filled 2019-01-14: qty 10

## 2019-01-14 MED ORDER — PHENYLEPHRINE 40 MCG/ML (10ML) SYRINGE FOR IV PUSH (FOR BLOOD PRESSURE SUPPORT)
PREFILLED_SYRINGE | INTRAVENOUS | Status: AC
  Filled 2019-01-14: qty 10

## 2019-01-14 MED ORDER — EPHEDRINE 5 MG/ML INJ
INTRAVENOUS | Status: AC
  Filled 2019-01-14: qty 10

## 2019-01-14 NOTE — Anesthesia Postprocedure Evaluation (Signed)
Anesthesia Post Note  Patient: Jennifer [name]  Procedure(s) Performed: VIDEO BRONCHOSCOPY WITH INSERTION OF INTERBRONCHIAL VALVE (IBV) (N/A )     Patient location during evaluation: PACU Anesthesia Type: General Level of consciousness: awake and alert Pain management: pain level controlled Vital Signs Assessment: post-procedure vital signs reviewed and stable Respiratory status: spontaneous breathing, nonlabored ventilation and respiratory function stable Cardiovascular status: blood pressure returned to baseline and stable Postop Assessment: no apparent nausea or vomiting Anesthetic complications: no    Last Vitals:  Vitals:   01/14/19 0400 01/14/19 0749  BP: 118/60 132/69  Pulse: 68 (!) 106  Resp: 16 18  Temp:  36.7 C  SpO2:  100%    Last Pain:  Vitals:   01/14/19 0749  TempSrc: Oral  PainSc: 0-No pain                 Audry Pili

## 2019-01-14 NOTE — Progress Notes (Signed)
      [address]       [place] [address]             [phone]      1 Day Post-Op Procedure(s) (LRB): VIDEO BRONCHOSCOPY WITH INSERTION OF INTERBRONCHIAL VALVE (IBV) (N/A) Subjective: Emotional this morning. She was told she will need to go back to the OR tomorrow.   Objective: Vital signs in last 24 hours: Temp:  [97.6 F (36.4 C)-98.2 F (36.8 C)] 98.1 F (36.7 C) (11/18 0749) Pulse Rate:  [61-106] 106 (11/18 0749) Cardiac Rhythm: Normal sinus rhythm (11/17 1900) Resp:  [13-23] 18 (11/18 0749) BP: (102-137)/(44-70) 132/69 (11/18 0749) SpO2:  [96 %-100 %] 100 % (11/18 0749) Weight:  [71 kg] 71 kg (11/17 1530)     Intake/Output from previous day: 11/17 0701 - 11/18 0700 In: 703.3 [I.V.:703.3] Out: 220 [Chest Tube:220] Intake/Output this shift: No intake/output data recorded.  General appearance: alert, cooperative and no distress Heart: regular rate and rhythm, S1, S2 normal, no murmur, click, rub or gallop Lungs: clear to auscultation bilaterally Abdomen: soft, non-tender; bowel sounds normal; no masses,  no organomegaly Extremities: extremities normal, atraumatic, no cyanosis or edema Wound: clean and dry  Lab Results: No results for input(s): WBC, HGB, HCT, PLT in the last 72 hours. BMET: No results for input(s): NA, K, CL, CO2, GLUCOSE, BUN, CREATININE, CALCIUM in the last 72 hours.  PT/INR: No results for input(s): LABPROT, INR in the last 72 hours. ABG    Component Value Date/Time   PHART 7.415 12/30/2018 0356   HCO3 26.9 12/30/2018 0356   O2SAT 95.5 12/30/2018 0356   CBG (last 3)  No results for input(s): GLUCAP in the last 72 hours.  Assessment/Plan: S/P Procedure(s) (LRB): VIDEO BRONCHOSCOPY WITH INSERTION OF INTERBRONCHIAL VALVE (IBV) (N/A)   POD 15 s/p L VATS, LULectomy, now with prolonged air leak  1. CV-NSR to ST, BP well controlled 2. Pulm-Will order CXR. Air leak still present 3. Renal-creatinine 0.69, electrolytes  okay 4. H and H stable 5. Endo-blood glucose well controlled  Plan: Back to OR tomorrow to re-explore. Patient understands the procedure.    LOS: 16 days    Elgie Collard 01/14/2019

## 2019-01-15 ENCOUNTER — Inpatient Hospital Stay (HOSPITAL_COMMUNITY): Payer: Medicare Other

## 2019-01-15 NOTE — Plan of Care (Signed)
  Problem: Education: Goal: Knowledge of disease or condition will improve Outcome: Progressing Goal: Knowledge of the prescribed therapeutic regimen will improve Outcome: Progressing   Problem: Activity: Goal: Risk for activity intolerance will decrease Outcome: Progressing   Problem: Cardiac: Goal: Will achieve and/or maintain hemodynamic stability Outcome: Progressing   Problem: Clinical Measurements: Goal: Postoperative complications will be avoided or minimized Outcome: Progressing   Problem: Respiratory: Goal: Respiratory status will improve Outcome: Progressing   Problem: Pain Management: Goal: Pain level will decrease Outcome: Progressing   Problem: Skin Integrity: Goal: Wound healing without signs and symptoms infection will improve Outcome: Progressing   Problem: Education: Goal: Knowledge of General Education information will improve Description: Including pain rating scale, medication(s)/side effects and non-pharmacologic comfort measures Outcome: Progressing   Problem: Health Behavior/Discharge Planning: Goal: Ability to manage health-related needs will improve Outcome: Progressing   Problem: Clinical Measurements: Goal: Ability to maintain clinical measurements within normal limits will improve Outcome: Progressing Goal: Will remain free from infection Outcome: Progressing Goal: Diagnostic test results will improve Outcome: Progressing Goal: Respiratory complications will improve Outcome: Progressing Goal: Cardiovascular complication will be avoided Outcome: Progressing   Problem: Activity: Goal: Risk for activity intolerance will decrease Outcome: Progressing   Problem: Nutrition: Goal: Adequate nutrition will be maintained Outcome: Progressing   Problem: Elimination: Goal: Will not experience complications related to bowel motility Outcome: Progressing Goal: Will not experience complications related to urinary retention Outcome:  Progressing   Problem: Coping: Goal: Level of anxiety will decrease Outcome: Progressing   Problem: Pain Managment: Goal: General experience of comfort will improve Outcome: Progressing   Problem: Safety: Goal: Ability to remain free from injury will improve Outcome: Progressing   Problem: Skin Integrity: Goal: Risk for impaired skin integrity will decrease Outcome: Progressing

## 2019-01-15 NOTE — Progress Notes (Signed)
      [address]       [place] [address]             [phone]      2 Days Post-Op Procedure(s) (LRB): VIDEO BRONCHOSCOPY WITH INSERTION OF INTERBRONCHIAL VALVE (IBV) (N/A) Subjective: Feels down this morning.   Objective: Vital signs in last 24 hours: Temp:  [97.6 F (36.4 C)-98.6 F (37 C)] 98.2 F (36.8 C) (11/19 0734) Pulse Rate:  [60-86] 86 (11/19 0734) Cardiac Rhythm: Normal sinus rhythm (11/19 0701) Resp:  [14-19] 15 (11/19 0734) BP: (100-135)/(51-68) 100/57 (11/19 0734) SpO2:  [97 %-99 %] 98 % (11/19 0307)     Intake/Output from previous day: 11/18 0701 - 11/19 0700 In: 120 [P.O.:120] Out: 32 [Urine:1; Stool:1; Chest Tube:30] Intake/Output this shift: Total I/O In: 240 [P.O.:240] Out: -   General appearance: alert, cooperative and no distress Heart: regular rate and rhythm, S1, S2 normal, no murmur, click, rub or gallop Lungs: clear to auscultation bilaterally Abdomen: soft, non-tender; bowel sounds normal; no masses,  no organomegaly Extremities: extremities normal, atraumatic, no cyanosis or edema Wound: clean and dry  Lab Results: No results for input(s): WBC, HGB, HCT, PLT in the last 72 hours. BMET: No results for input(s): NA, K, CL, CO2, GLUCOSE, BUN, CREATININE, CALCIUM in the last 72 hours.  PT/INR: No results for input(s): LABPROT, INR in the last 72 hours. ABG    Component Value Date/Time   PHART 7.415 12/30/2018 0356   HCO3 26.9 12/30/2018 0356   O2SAT 95.5 12/30/2018 0356   CBG (last 3)  No results for input(s): GLUCAP in the last 72 hours.  Assessment/Plan: S/P Procedure(s) (LRB): VIDEO BRONCHOSCOPY WITH INSERTION OF INTERBRONCHIAL VALVE (IBV) (N/A)  POD 17 s/p L VATS, LULectomy, now with prolonged air leak  1. CV-NSR to ST, BP well controlled 2. Pulm-Will order CXR. Air leak still present 3. Renal-creatinine 0.69, electrolytes okay 4. H and H stable 5. Endo-blood glucose well controlled  Plan: Air leak  continues but improving. Keep chest tube on water seal. Ambulate in the halls as able. Encouraged to use her incentive spirometer.    LOS: 17 days    Elgie Collard 01/15/2019

## 2019-01-16 ENCOUNTER — Inpatient Hospital Stay (HOSPITAL_COMMUNITY): Payer: Medicare Other

## 2019-01-16 NOTE — Progress Notes (Addendum)
      [address]       ,[place] [address]             [phone]      3 Days Post-Op Procedure(s) (LRB): VIDEO BRONCHOSCOPY WITH INSERTION OF INTERBRONCHIAL VALVE (IBV) (N/A) Subjective: Feels okay this morning. She has some comfort in there being a plan for her care  Objective: Vital signs in last 24 hours: Temp:  [98 F (36.7 C)-98.7 F (37.1 C)] 98 F (36.7 C) (11/20 0755) Pulse Rate:  [71-87] 87 (11/20 0329) Cardiac Rhythm: Normal sinus rhythm (11/20 0701) Resp:  [17-21] 20 (11/20 0755) BP: (102-129)/(46-62) 109/61 (11/20 0755) SpO2:  [97 %-99 %] 98 % (11/20 0329)     Intake/Output from previous day: 11/19 0701 - 11/20 0700 In: 720 [P.O.:720] Out: 0  Intake/Output this shift: No intake/output data recorded.  General appearance: alert, cooperative and no distress Heart: regular rate and rhythm, S1, S2 normal, no murmur, click, rub or gallop Lungs: clear to auscultation bilaterally Abdomen: soft, non-tender; bowel sounds normal; no masses,  no organomegaly Extremities: extremities normal, atraumatic, no cyanosis or edema Wound: clean and dry  Lab Results: No results for input(s): WBC, HGB, HCT, PLT in the last 72 hours. BMET: No results for input(s): NA, K, CL, CO2, GLUCOSE, BUN, CREATININE, CALCIUM in the last 72 hours.  PT/INR: No results for input(s): LABPROT, INR in the last 72 hours. ABG    Component Value Date/Time   PHART 7.415 12/30/2018 0356   HCO3 26.9 12/30/2018 0356   O2SAT 95.5 12/30/2018 0356   CBG (last 3)  No results for input(s): GLUCAP in the last 72 hours.  Assessment/Plan: S/P Procedure(s) (LRB): VIDEO BRONCHOSCOPY WITH INSERTION OF INTERBRONCHIAL VALVE (IBV) (N/A)   1. CV-NSR in the 70s,  BP well controlled 2. Pulm-Will order CXR. Air leak still present 3. Renal-creatinine 0.69, electrolytes okay 4. H and H stable 5. Endo-blood glucose well controlled  Plan: Continued intermittent air leak. Continue on water  seal. Will give the lung a few days to heal. If on Monday if there is still an air leak, then she will need to go back to the OR. Discussed with the patient and Dr. Kipp Brood.    LOS: 18 days    Elgie Collard 01/16/2019   Leak slightly improved OR on Monday for L VATS, exploration, unless leak completely resolved over the weekend

## 2019-01-16 NOTE — Care Management Important Message (Signed)
Important Message  Patient Details  Name: Jennifer [name] MRN: [id number] Date of Birth: Apr 28, 1945   Medicare Important Message Given:  Yes     Memory Argue 01/16/2019, 12:00 PM

## 2019-01-16 NOTE — Plan of Care (Signed)
  Problem: Education: Goal: Knowledge of disease or condition will improve Outcome: Progressing Goal: Knowledge of the prescribed therapeutic regimen will improve Outcome: Progressing   Problem: Education: Goal: Knowledge of the prescribed therapeutic regimen will improve Outcome: Progressing   Problem: Activity: Goal: Risk for activity intolerance will decrease Outcome: Progressing   Problem: Cardiac: Goal: Will achieve and/or maintain hemodynamic stability Outcome: Progressing   Problem: Clinical Measurements: Goal: Postoperative complications will be avoided or minimized Outcome: Progressing   Problem: Respiratory: Goal: Respiratory status will improve Outcome: Progressing   Problem: Pain Management: Goal: Pain level will decrease Outcome: Progressing   Problem: Skin Integrity: Goal: Wound healing without signs and symptoms infection will improve Outcome: Progressing   Problem: Education: Goal: Knowledge of General Education information will improve Description: Including pain rating scale, medication(s)/side effects and non-pharmacologic comfort measures Outcome: Progressing   Problem: Health Behavior/Discharge Planning: Goal: Ability to manage health-related needs will improve Outcome: Progressing   Problem: Clinical Measurements: Goal: Ability to maintain clinical measurements within normal limits will improve Outcome: Progressing Goal: Will remain free from infection Outcome: Progressing Goal: Diagnostic test results will improve Outcome: Progressing Goal: Respiratory complications will improve Outcome: Progressing Goal: Cardiovascular complication will be avoided Outcome: Progressing   Problem: Activity: Goal: Risk for activity intolerance will decrease Outcome: Progressing   Problem: Nutrition: Goal: Adequate nutrition will be maintained Outcome: Progressing   Problem: Coping: Goal: Level of anxiety will decrease Outcome: Progressing    Problem: Elimination: Goal: Will not experience complications related to bowel motility Outcome: Progressing Goal: Will not experience complications related to urinary retention Outcome: Progressing   Problem: Pain Managment: Goal: General experience of comfort will improve Outcome: Progressing   Problem: Safety: Goal: Ability to remain free from injury will improve Outcome: Progressing   Problem: Skin Integrity: Goal: Risk for impaired skin integrity will decrease Outcome: Progressing

## 2019-01-17 ENCOUNTER — Inpatient Hospital Stay (HOSPITAL_COMMUNITY): Payer: Medicare Other

## 2019-01-17 NOTE — Progress Notes (Addendum)
      [address]       [place] [address]             [phone]       4 Days Post-Op Procedure(s) (LRB): VIDEO BRONCHOSCOPY WITH INSERTION OF INTERBRONCHIAL VALVE (IBV) (N/A)  Subjective: Patient hoping air leak stops so does not have to go to OR  Objective: Vital signs in last 24 hours: Temp:  [98.1 F (36.7 C)-98.4 F (36.9 C)] 98.4 F (36.9 C) (11/21 0857) Pulse Rate:  [72-74] 72 (11/21 0355) Cardiac Rhythm: Normal sinus rhythm (11/21 0857) Resp:  [19-24] 19 (11/21 0355) BP: (90-136)/(45-61) 111/61 (11/21 0927) SpO2:  [96 %-99 %] 96 % (11/21 0355)     Intake/Output from previous day: No intake/output data recorded.   Physical Exam:  Cardiovascular: RRR Pulmonary: Clear to auscultation bilaterally Abdomen: Soft, non tender, bowel sounds present. Extremities: No lower extremity edema. Wounds: Clean and dry.  No erythema or signs of infection. Chest Tube: to water seal, +++ air leak with cough  Lab Results: CBC:No results for input(s): WBC, HGB, HCT, PLT in the last 72 hours. BMET: No results for input(s): NA, K, CL, CO2, GLUCOSE, BUN, CREATININE, CALCIUM in the last 72 hours.  PT/INR: No results for input(s): LABPROT, INR in the last 72 hours. ABG:  INR: Will add last result for INR, ABG once components are confirmed Will add last 4 CBG results once components are confirmed  Assessment/Plan:  1. CV - SR.  2.  Pulmonary - On room air. Chest tube is to remain on water seal. There is a +++ air leak with cough. CXR this am appears stable (left apical and basilar pneumothorax and some subcutaneous emphysema left lateral chest wall). Encourage incentive spirometer. If has air leak in am, will go back to OR Monday   Sharalyn Ink ZimmermanPA-C 01/17/2019,9:33 AM [phone]  Normal tidle with normal respiration 3+ air leak with cough Sub q air resolved remains on water seal I have seen and examined Jennifer [name] and agree with the above  assessment  and plan.  Grace Isaac MD Beeper [phone] Office [phone] 01/17/2019 11:50 AM

## 2019-01-17 NOTE — Plan of Care (Signed)

## 2019-01-18 ENCOUNTER — Inpatient Hospital Stay (HOSPITAL_COMMUNITY): Payer: Medicare Other

## 2019-01-18 MED ORDER — SODIUM CHLORIDE 0.9 % IV SOLN
1.5000 g | INTRAVENOUS | Status: AC
  Administered 2019-01-19: 1.5 g via INTRAVENOUS
  Filled 2019-01-18: qty 1.5

## 2019-01-18 MED ORDER — LISINOPRIL 20 MG PO TABS
30.0000 mg | ORAL_TABLET | Freq: Every day | ORAL | Status: DC
  Administered 2019-01-18: 30 mg via ORAL
  Filled 2019-01-18: qty 1

## 2019-01-18 MED ORDER — CARBAMIDE PEROXIDE 6.5 % OT SOLN
5.0000 [drp] | Freq: Two times a day (BID) | OTIC | Status: DC
  Filled 2019-01-18: qty 15

## 2019-01-18 NOTE — Plan of Care (Signed)

## 2019-01-18 NOTE — Progress Notes (Addendum)
      [address]       [place] [address]             [phone]       5 Days Post-Op Procedure(s) (LRB): VIDEO BRONCHOSCOPY WITH INSERTION OF INTERBRONCHIAL VALVE (IBV) (N/A)  Subjective: Patient without specific complaints this am.  Objective: Vital signs in last 24 hours: Temp:  [97.9 F (36.6 C)-98.5 F (36.9 C)] 97.9 F (36.6 C) (11/22 0323) Pulse Rate:  [61-89] 72 (11/22 0323) Cardiac Rhythm: Normal sinus rhythm (11/22 0758) Resp:  [16-23] 18 (11/22 0323) BP: (98-126)/(47-72) 120/55 (11/22 0323) SpO2:  [98 %-100 %] 99 % (11/22 0323)     Intake/Output from previous day: 11/21 0701 - 11/22 0700 In: 46 [P.O.:820] Out: 0    Physical Exam:  Cardiovascular: RRR Pulmonary: Clear to auscultation bilaterally Abdomen: Soft, non tender, bowel sounds present. Extremities: No lower extremity edema. Wounds: Clean and dry.  No erythema or signs of infection. Chest Tube: to water seal, +++ air leak with cough  Lab Results: CBC:No results for input(s): WBC, HGB, HCT, PLT in the last 72 hours. BMET: No results for input(s): NA, K, CL, CO2, GLUCOSE, BUN, CREATININE, CALCIUM in the last 72 hours.  PT/INR: No results for input(s): LABPROT, INR in the last 72 hours. ABG:  INR: Will add last result for INR, ABG once components are confirmed Will add last 4 CBG results once components are confirmed  Assessment/Plan:  1. CV - SR. Patient on Lisinopril 40 mg daily but took 30 mg at home so will decrease back to home dose and monitor BP 2.  Pulmonary - On room air. Chest tube is to remain on water seal. There is a + air leak with cough. CXR this am appears stable (left apical and basilar pneumothorax and mild subcutaneous emphysema left lateral chest wall). Encourage incentive spirometer. Likely OR in am so will make NPO after midnight  Jennifer M ZimmermanPA-C 01/18/2019,8:51 AM [phone]  Change in degree of air leak noted today, calm tidals  significantly, but chest very small air leak Will keep n.p.o. and reassess in a.m., if continues to improve may not require repeat surgery I have seen and examined Jennifer [name] and agree with the above assessment  and plan.  Grace Isaac MD Beeper [phone] Office [phone] 01/18/2019 11:55 AM

## 2019-01-19 ENCOUNTER — Encounter (HOSPITAL_COMMUNITY)
Admission: RE | Disposition: A | Discharge status: Home / Self Care | Payer: Self-pay | Source: Home / Self Care | Attending: Thoracic Surgery (Cardiothoracic Vascular Surgery)

## 2019-01-19 ENCOUNTER — Inpatient Hospital Stay (HOSPITAL_COMMUNITY): Payer: Medicare Other | Admitting: Certified Registered Nurse Anesthetist

## 2019-01-19 ENCOUNTER — Inpatient Hospital Stay (HOSPITAL_COMMUNITY): Payer: Medicare Other

## 2019-01-19 ENCOUNTER — Encounter (HOSPITAL_COMMUNITY): Payer: Self-pay | Admitting: Thoracic Surgery (Cardiothoracic Vascular Surgery)

## 2019-01-19 DIAGNOSIS — Z09 Encounter for follow-up examination after completed treatment for conditions other than malignant neoplasm: Secondary | ICD-10-CM

## 2019-01-19 DIAGNOSIS — J95812 Postprocedural air leak: Secondary | ICD-10-CM

## 2019-01-19 HISTORY — PX: VIDEO ASSISTED THORACOSCOPY: SHX5073

## 2019-01-19 HISTORY — PX: CHEST TUBE INSERTION: SHX231

## 2019-01-19 HISTORY — PX: PLEURADESIS: SHX6030

## 2019-01-19 LAB — GLUCOSE, CAPILLARY
Glucose-Capillary: 102 mg/dL — ABNORMAL HIGH (ref 70–99)
Glucose-Capillary: 143 mg/dL — ABNORMAL HIGH (ref 70–99)
Glucose-Capillary: 155 mg/dL — ABNORMAL HIGH (ref 70–99)

## 2019-01-19 SURGERY — VIDEO ASSISTED THORACOSCOPY
Anesthesia: General | Site: Chest | Laterality: Left

## 2019-01-19 MED ORDER — LIDOCAINE 2% (20 MG/ML) 5 ML SYRINGE
INTRAMUSCULAR | Status: DC | PRN
  Administered 2019-01-19: 40 mg via INTRAVENOUS

## 2019-01-19 MED ORDER — DEXAMETHASONE SODIUM PHOSPHATE 10 MG/ML IJ SOLN
INTRAMUSCULAR | Status: AC
  Filled 2019-01-19: qty 1

## 2019-01-19 MED ORDER — CEFAZOLIN SODIUM-DEXTROSE 2-4 GM/100ML-% IV SOLN
2.0000 g | Freq: Three times a day (TID) | INTRAVENOUS | Status: AC
  Administered 2019-01-19 (×2): 2 g via INTRAVENOUS
  Filled 2019-01-19 (×2): qty 100

## 2019-01-19 MED ORDER — LACTATED RINGERS IV SOLN
INTRAVENOUS | Status: DC | PRN
  Administered 2019-01-19: 08:00:00 via INTRAVENOUS

## 2019-01-19 MED ORDER — SODIUM CHLORIDE 0.9 % IV SOLN: INTRAVENOUS | Status: DC | PRN

## 2019-01-19 MED ORDER — LIDOCAINE 2% (20 MG/ML) 5 ML SYRINGE
INTRAMUSCULAR | Status: AC
  Filled 2019-01-19: qty 5

## 2019-01-19 MED ORDER — 0.9 % SODIUM CHLORIDE (POUR BTL) OPTIME
TOPICAL | Status: DC | PRN
  Administered 2019-01-19: 1000 mL
  Administered 2019-01-19: 09:00:00 2000 mL

## 2019-01-19 MED ORDER — PROPOFOL 10 MG/ML IV BOLUS
INTRAVENOUS | Status: DC | PRN
  Administered 2019-01-19: 130 mg via INTRAVENOUS

## 2019-01-19 MED ORDER — OXYCODONE HCL 5 MG/5ML PO SOLN: 5.0000 mg | Freq: Once | ORAL | Status: DC | PRN

## 2019-01-19 MED ORDER — FENTANYL CITRATE (PF) 250 MCG/5ML IJ SOLN
INTRAMUSCULAR | Status: DC | PRN
  Administered 2019-01-19: 50 ug via INTRAVENOUS
  Administered 2019-01-19: 100 ug via INTRAVENOUS
  Administered 2019-01-19 (×2): 25 ug via INTRAVENOUS

## 2019-01-19 MED ORDER — INSULIN ASPART 100 UNIT/ML ~~LOC~~ SOLN
0.0000 [IU] | Freq: Three times a day (TID) | SUBCUTANEOUS | Status: DC
  Administered 2019-01-19 (×2): 2 [IU] via SUBCUTANEOUS

## 2019-01-19 MED ORDER — ACETAMINOPHEN 160 MG/5ML PO SOLN: 1000.0000 mg | Freq: Four times a day (QID) | ORAL | Status: DC

## 2019-01-19 MED ORDER — HEMOSTATIC AGENTS (NO CHARGE) OPTIME
TOPICAL | Status: DC | PRN
  Administered 2019-01-19: 1 via TOPICAL

## 2019-01-19 MED ORDER — SUGAMMADEX SODIUM 200 MG/2ML IV SOLN
INTRAVENOUS | Status: DC | PRN
  Administered 2019-01-19: 180 mg via INTRAVENOUS

## 2019-01-19 MED ORDER — PHENYLEPHRINE 40 MCG/ML (10ML) SYRINGE FOR IV PUSH (FOR BLOOD PRESSURE SUPPORT)
PREFILLED_SYRINGE | INTRAVENOUS | Status: DC | PRN
  Administered 2019-01-19: 120 ug via INTRAVENOUS
  Administered 2019-01-19: 200 ug via INTRAVENOUS

## 2019-01-19 MED ORDER — BISACODYL 5 MG PO TBEC
10.0000 mg | DELAYED_RELEASE_TABLET | Freq: Every day | ORAL | Status: DC
  Filled 2019-01-19 (×2): qty 2

## 2019-01-19 MED ORDER — EPHEDRINE SULFATE 50 MG/ML IJ SOLN
INTRAMUSCULAR | Status: DC | PRN
  Administered 2019-01-19 (×2): 5 mg via INTRAVENOUS

## 2019-01-19 MED ORDER — PHENYLEPHRINE HCL-NACL 10-0.9 MG/250ML-% IV SOLN
INTRAVENOUS | Status: DC | PRN
  Administered 2019-01-19: 25 ug/min via INTRAVENOUS

## 2019-01-19 MED ORDER — MIDAZOLAM HCL 2 MG/2ML IJ SOLN
INTRAMUSCULAR | Status: AC
  Filled 2019-01-19: qty 2

## 2019-01-19 MED ORDER — PROPOFOL 10 MG/ML IV BOLUS
INTRAVENOUS | Status: AC
  Filled 2019-01-19: qty 20

## 2019-01-19 MED ORDER — BUPIVACAINE HCL (PF) 0.5 % IJ SOLN
INTRAMUSCULAR | Status: AC
  Filled 2019-01-19: qty 30

## 2019-01-19 MED ORDER — MORPHINE SULFATE (PF) 2 MG/ML IV SOLN: 1.0000 mg | INTRAVENOUS | Status: DC | PRN

## 2019-01-19 MED ORDER — FENTANYL CITRATE (PF) 100 MCG/2ML IJ SOLN
INTRAMUSCULAR | Status: AC
  Filled 2019-01-19: qty 2

## 2019-01-19 MED ORDER — ROCURONIUM BROMIDE 10 MG/ML (PF) SYRINGE
PREFILLED_SYRINGE | INTRAVENOUS | Status: DC | PRN
  Administered 2019-01-19: 10 mg via INTRAVENOUS
  Administered 2019-01-19: 55 mg via INTRAVENOUS
  Administered 2019-01-19: 15 mg via INTRAVENOUS
  Administered 2019-01-19: 30 mg via INTRAVENOUS

## 2019-01-19 MED ORDER — BUPIVACAINE LIPOSOME 1.3 % IJ SUSP
INTRAMUSCULAR | Status: DC | PRN
  Administered 2019-01-19: 50 mL

## 2019-01-19 MED ORDER — LACTATED RINGERS IV SOLN
INTRAVENOUS | Status: DC | PRN
  Administered 2019-01-19: 07:00:00 via INTRAVENOUS

## 2019-01-19 MED ORDER — ONDANSETRON HCL 4 MG/2ML IJ SOLN
INTRAMUSCULAR | Status: AC
  Filled 2019-01-19: qty 2

## 2019-01-19 MED ORDER — FENTANYL CITRATE (PF) 250 MCG/5ML IJ SOLN
INTRAMUSCULAR | Status: AC
  Filled 2019-01-19: qty 5

## 2019-01-19 MED ORDER — BUPIVACAINE LIPOSOME 1.3 % IJ SUSP
20.0000 mL | Freq: Once | INTRAMUSCULAR | Status: DC
  Filled 2019-01-19: qty 20

## 2019-01-19 MED ORDER — ACETAMINOPHEN 500 MG PO TABS
1000.0000 mg | ORAL_TABLET | Freq: Four times a day (QID) | ORAL | Status: DC
  Administered 2019-01-19 – 2019-01-21 (×7): 1000 mg via ORAL
  Filled 2019-01-19 (×7): qty 2

## 2019-01-19 MED ORDER — TRAMADOL HCL 50 MG PO TABS
50.0000 mg | ORAL_TABLET | Freq: Four times a day (QID) | ORAL | Status: DC | PRN
  Administered 2019-01-19 – 2019-01-20 (×2): 100 mg via ORAL
  Filled 2019-01-19 (×2): qty 2

## 2019-01-19 MED ORDER — DEXAMETHASONE SODIUM PHOSPHATE 10 MG/ML IJ SOLN
INTRAMUSCULAR | Status: DC | PRN
  Administered 2019-01-19: 10 mg via INTRAVENOUS

## 2019-01-19 MED ORDER — MIDAZOLAM HCL 2 MG/2ML IJ SOLN
INTRAMUSCULAR | Status: DC | PRN
  Administered 2019-01-19: 1 mg via INTRAVENOUS

## 2019-01-19 MED ORDER — LISINOPRIL 20 MG PO TABS
30.0000 mg | ORAL_TABLET | Freq: Every day | ORAL | Status: DC
  Administered 2019-01-20 – 2019-01-21 (×2): 30 mg via ORAL
  Filled 2019-01-19 (×2): qty 1

## 2019-01-19 MED ORDER — ONDANSETRON HCL 4 MG/2ML IJ SOLN: 4.0000 mg | Freq: Once | INTRAMUSCULAR | Status: DC | PRN

## 2019-01-19 MED ORDER — FENTANYL CITRATE (PF) 100 MCG/2ML IJ SOLN
25.0000 ug | INTRAMUSCULAR | Status: DC | PRN
  Administered 2019-01-19: 25 ug via INTRAVENOUS

## 2019-01-19 MED ORDER — ONDANSETRON HCL 4 MG/2ML IJ SOLN
INTRAMUSCULAR | Status: DC | PRN
  Administered 2019-01-19: 4 mg via INTRAVENOUS

## 2019-01-19 MED ORDER — OXYCODONE HCL 5 MG PO TABS: 5.0000 mg | ORAL_TABLET | Freq: Once | ORAL | Status: DC | PRN

## 2019-01-19 MED ORDER — ONDANSETRON HCL 4 MG/2ML IJ SOLN: 4.0000 mg | Freq: Four times a day (QID) | INTRAMUSCULAR | Status: DC | PRN

## 2019-01-19 MED ORDER — SENNOSIDES-DOCUSATE SODIUM 8.6-50 MG PO TABS
1.0000 | ORAL_TABLET | Freq: Every day | ORAL | Status: DC
  Filled 2019-01-19: qty 1

## 2019-01-19 SURGICAL SUPPLY — 110 items
APPLIER CLIP ROT 10 11.4 M/L (STAPLE)
BLADE CLIPPER SURG (BLADE) ×3 IMPLANT
CANISTER SUCT 3000ML PPV (MISCELLANEOUS) ×3 IMPLANT
CATH ROBINSON RED A/P 14FR (CATHETERS) IMPLANT
CATH THORACIC 28FR (CATHETERS) IMPLANT
CATH THORACIC 28FR RT ANG (CATHETERS) IMPLANT
CATH THORACIC 36FR (CATHETERS) IMPLANT
CATH THORACIC 36FR RT ANG (CATHETERS) IMPLANT
CATH TROCAR 20FR (CATHETERS) IMPLANT
CHLORAPREP W/TINT 26 (MISCELLANEOUS) ×3 IMPLANT
CLEANER TIP ELECTROSURG 2X2 (MISCELLANEOUS) ×2 IMPLANT
CLIP APPLIE ROT 10 11.4 M/L (STAPLE) IMPLANT
CLIP VESOCCLUDE MED 6/CT (CLIP) ×3 IMPLANT
CONN ST 1/4X3/8  BEN (MISCELLANEOUS)
CONN ST 1/4X3/8 BEN (MISCELLANEOUS) IMPLANT
CONN Y 3/8X3/8X3/8  BEN (MISCELLANEOUS)
CONN Y 3/8X3/8X3/8 BEN (MISCELLANEOUS) IMPLANT
CONT SPEC 4OZ CLIKSEAL STRL BL (MISCELLANEOUS) ×10 IMPLANT
COVER SURGICAL LIGHT HANDLE (MISCELLANEOUS) IMPLANT
COVER WAND RF STERILE (DRAPES) ×3 IMPLANT
DEFOGGER SCOPE WARMER CLEARIFY (MISCELLANEOUS) ×4 IMPLANT
DERMABOND ADVANCED (GAUZE/BANDAGES/DRESSINGS) ×6
DERMABOND ADVANCED .7 DNX12 (GAUZE/BANDAGES/DRESSINGS) IMPLANT
DISSECTOR BLUNT TIP ENDO 5MM (MISCELLANEOUS) IMPLANT
DRAIN CHANNEL 28F RND 3/8 FF (WOUND CARE) IMPLANT
DRAIN CHANNEL 32F RND 10.7 FF (WOUND CARE) IMPLANT
DRAPE WARM FLUID 44X44 (DRAPES) ×3 IMPLANT
ELECT BLADE 4.0 EZ CLEAN MEGAD (MISCELLANEOUS) ×3
ELECT BLADE 6.5 EXT (BLADE) ×3 IMPLANT
ELECT CAUTERY BLADE 6.4 (BLADE) ×2 IMPLANT
ELECT REM PT RETURN 9FT ADLT (ELECTROSURGICAL) ×3
ELECTRODE BLDE 4.0 EZ CLN MEGD (MISCELLANEOUS) IMPLANT
ELECTRODE REM PT RTRN 9FT ADLT (ELECTROSURGICAL) ×1 IMPLANT
GAUZE SPONGE 4X4 12PLY STRL (GAUZE/BANDAGES/DRESSINGS) ×3 IMPLANT
GAUZE SPONGE 4X4 12PLY STRL LF (GAUZE/BANDAGES/DRESSINGS) ×2 IMPLANT
GLOVE BIO SURGEON STRL SZ7 (GLOVE) ×6 IMPLANT
GLOVE INDICATOR 7.5 STRL GRN (GLOVE) ×2 IMPLANT
GOWN STRL REUS W/ TWL LRG LVL3 (GOWN DISPOSABLE) ×2 IMPLANT
GOWN STRL REUS W/ TWL XL LVL3 (GOWN DISPOSABLE) ×1 IMPLANT
GOWN STRL REUS W/TWL LRG LVL3 (GOWN DISPOSABLE) ×4
GOWN STRL REUS W/TWL XL LVL3 (GOWN DISPOSABLE) ×2
HANDLE STAPLE ENDO GIA SHORT (STAPLE) ×4
HEMOSTAT SURGICEL 2X14 (HEMOSTASIS) IMPLANT
KIT BASIN OR (CUSTOM PROCEDURE TRAY) ×3 IMPLANT
KIT SUCTION CATH 14FR (SUCTIONS) IMPLANT
KIT TURNOVER KIT B (KITS) ×3 IMPLANT
NDL SPNL 18GX3.5 QUINCKE PK (NEEDLE) IMPLANT
NEEDLE 22X1 1/2 (OR ONLY) (NEEDLE) ×5 IMPLANT
NEEDLE SPNL 18GX3.5 QUINCKE PK (NEEDLE) IMPLANT
NS IRRIG 1000ML POUR BTL (IV SOLUTION) ×11 IMPLANT
PACK CHEST (CUSTOM PROCEDURE TRAY) ×3 IMPLANT
PACK UNIVERSAL I (CUSTOM PROCEDURE TRAY) ×3 IMPLANT
PAD ARMBOARD 7.5X6 YLW CONV (MISCELLANEOUS) ×6 IMPLANT
PENCIL BUTTON HOLSTER BLD 10FT (ELECTRODE) ×2 IMPLANT
POUCH ENDO CATCH II 15MM (MISCELLANEOUS) IMPLANT
POUCH SPECIMEN RETRIEVAL 10MM (ENDOMECHANICALS) IMPLANT
PROGEL SPRAY TIP 11IN (MISCELLANEOUS) ×3
RELOAD STAPLE 45 PURP MED/THCK (STAPLE) IMPLANT
RELOAD TRI 45 ART MED THCK PUR (STAPLE) ×9 IMPLANT
SCISSORS LAP 5X35 DISP (ENDOMECHANICALS) IMPLANT
SEALANT PROGEL (MISCELLANEOUS) ×2 IMPLANT
SEALANT SURG COSEAL 4ML (VASCULAR PRODUCTS) IMPLANT
SEALANT SURG COSEAL 8ML (VASCULAR PRODUCTS) IMPLANT
SEALER LIGASURE MARYLAND 30 (ELECTROSURGICAL) ×3 IMPLANT
SET IRRIG TUBING LAPAROSCOPIC (IRRIGATION / IRRIGATOR) IMPLANT
SPECIMEN JAR MEDIUM (MISCELLANEOUS) IMPLANT
SPONGE INTESTINAL PEANUT (DISPOSABLE) ×6 IMPLANT
SPONGE TONSIL TAPE 1 RFD (DISPOSABLE) ×3 IMPLANT
STAPLER ENDO GIA 12 SHRT THIN (STAPLE) ×1 IMPLANT
STAPLER ENDO GIA 12MM SHORT (STAPLE) ×2 IMPLANT
STAPLER VISISTAT 35W (STAPLE) ×2 IMPLANT
STOPCOCK 4 WAY LG BORE MALE ST (IV SETS) ×5 IMPLANT
SUT MNCRL AB 3-0 PS2 18 (SUTURE) IMPLANT
SUT MNCRL AB 4-0 PS2 18 (SUTURE) ×2 IMPLANT
SUT MON AB 2-0 CT1 36 (SUTURE) IMPLANT
SUT PDS AB 1 CTX 36 (SUTURE) IMPLANT
SUT PROLENE 4 0 RB 1 (SUTURE) ×2
SUT PROLENE 4-0 RB1 .5 CRCL 36 (SUTURE) IMPLANT
SUT SILK  1 MH (SUTURE) ×4
SUT SILK 1 MH (SUTURE) IMPLANT
SUT SILK 1 TIES 10X30 (SUTURE) ×3 IMPLANT
SUT SILK 2 0 SH (SUTURE) IMPLANT
SUT SILK 2 0SH CR/8 30 (SUTURE) IMPLANT
SUT VIC AB 1 CTX 36 (SUTURE)
SUT VIC AB 1 CTX36XBRD ANBCTR (SUTURE) IMPLANT
SUT VIC AB 2-0 CT1 27 (SUTURE) ×2
SUT VIC AB 2-0 CT1 TAPERPNT 27 (SUTURE) ×2 IMPLANT
SUT VIC AB 3-0 SH 27 (SUTURE) ×6
SUT VIC AB 3-0 SH 27X BRD (SUTURE) ×1 IMPLANT
SUT VIC AB 3-0 X1 27 (SUTURE) IMPLANT
SUT VICRYL 0 UR6 27IN ABS (SUTURE) ×5 IMPLANT
SUT VICRYL 2 TP 1 (SUTURE) IMPLANT
SYR 10ML LL (SYRINGE) ×5 IMPLANT
SYR 30ML LL (SYRINGE) ×5 IMPLANT
SYR 50ML LL SCALE MARK (SYRINGE) ×5 IMPLANT
SYSTEM SAHARA CHEST DRAIN ATS (WOUND CARE) ×3 IMPLANT
TAPE CLOTH 4X10 WHT NS (GAUZE/BANDAGES/DRESSINGS) ×3 IMPLANT
TAPE CLOTH SURG 4X10 WHT LF (GAUZE/BANDAGES/DRESSINGS) ×2 IMPLANT
TIP APPLICATOR SPRAY EXTEND 16 (VASCULAR PRODUCTS) IMPLANT
TIP SPRAY PROGEL 11IN (MISCELLANEOUS) IMPLANT
TOWEL GREEN STERILE (TOWEL DISPOSABLE) ×3 IMPLANT
TOWEL GREEN STERILE FF (TOWEL DISPOSABLE) ×3 IMPLANT
TRAY FOLEY MTR SLVR 16FR STAT (SET/KITS/TRAYS/PACK) ×3 IMPLANT
TRAY WAYNE PNEUMOTHORAX 14X18 (TRAY / TRAY PROCEDURE) ×2 IMPLANT
TROCAR BLADELESS 5M (ENDOMECHANICALS) ×2 IMPLANT
TROCAR XCEL BLADELESS 5X75MML (TROCAR) ×3 IMPLANT
TUBE CONNECTING 12'X1/4 (SUCTIONS) ×1
TUBE CONNECTING 12X1/4 (SUCTIONS) ×1 IMPLANT
TUBING EXTENTION W/L.L. (IV SETS) ×5 IMPLANT
WATER STERILE IRR 1000ML POUR (IV SOLUTION) ×3 IMPLANT

## 2019-01-19 NOTE — Anesthesia Procedure Notes (Signed)
Procedure Name: Intubation Date/Time: 01/19/2019 7:48 AM Performed by: Bryson Corona, CRNA Pre-anesthesia Checklist: Patient identified, Emergency Drugs available, Suction available and Patient being monitored Patient Re-evaluated:Patient Re-evaluated prior to induction Oxygen Delivery Method: Circle System Utilized Preoxygenation: Pre-oxygenation with 100% oxygen Induction Type: IV induction Ventilation: Oral airway inserted - appropriate to patient size Laryngoscope Size: Mac and 4 Grade View: Grade I Tube type: Oral Endobronchial tube: Double lumen EBT and EBT position confirmed by fiberoptic bronchoscope and 35 Fr Number of attempts: 1 Airway Equipment and Method: Stylet and Oral airway Placement Confirmation: ETT inserted through vocal cords under direct vision,  positive ETCO2 and breath sounds checked- equal and bilateral Tube secured with: Tape Dental Injury: Teeth and Oropharynx as per pre-operative assessment

## 2019-01-19 NOTE — Plan of Care (Signed)

## 2019-01-19 NOTE — Transfer of Care (Signed)
Immediate Anesthesia Transfer of Care Note  Patient: Jennifer [name]  Procedure(s) Performed: VIDEO ASSISTED THORACOSCOPY (Left Chest) Chest Tube Insertion (Left Chest) Mechanical Pleuradesis (Left Chest)  Patient Location: PACU  Anesthesia Type:General  Level of Consciousness: drowsy and patient cooperative  Airway & Oxygen Therapy: Patient Spontanous Breathing and Patient connected to face mask oxygen  Post-op Assessment: Report given to RN and Post -op Vital signs reviewed and stable  Post vital signs: Reviewed and stable  Last Vitals:  Vitals Value Taken Time  BP 133/53 01/19/19 1013  Temp    Pulse 91   Resp 19 01/19/19 1016  SpO2 100   Vitals shown include unvalidated device data.  Last Pain:  Vitals:   01/19/19 0342  TempSrc: Oral  PainSc:       Patients Stated Pain Goal: 0 (03/06/30 3557)  Complications: No apparent anesthesia complications

## 2019-01-19 NOTE — Progress Notes (Signed)
     [address]       [place] [address]             [phone]       No events Vitals:   01/18/19 2305 01/19/19 0342  BP: (!) 110/59 118/68  Pulse: 70 71  Resp: 18 19  Temp: 98.2 F (36.8 C) 97.8 F (36.6 C)  SpO2: 99% 99%   Alert NAD EWOB, good tidaling.  No leak with cough this am  S/p LULectomy, with persistent air leak despite endobronchial valve placement OR today for L VATS

## 2019-01-19 NOTE — Brief Op Note (Signed)
12/29/2018 - 01/19/2019  3:44 PM  PATIENT:  Deon Pilling  73 y.o. female  PRE-OPERATIVE DIAGNOSIS:  persistent air leak  POST-OPERATIVE DIAGNOSIS:  Persistent air leak.  PROCEDURE:  Procedure(s): VIDEO ASSISTED THORACOSCOPY (Left) Chest Tube Insertion (Left) Mechanical Pleuradesis (Left)  SURGEON:  Surgeon(s) and Role:    * Lightfoot, Lucile Crater, MD - Primary  PHYSICIAN ASSISTANT:  Nicholes Rough, PA-C  ANESTHESIA:   general  EBL:  15 mL   BLOOD ADMINISTERED:none  DRAINS: pigtail catheter drain   LOCAL MEDICATIONS USED:  BUPIVICAINE   SPECIMEN:  Source of Specimen:  left upper lobe wedge resection  DISPOSITION OF SPECIMEN:  PATHOLOGY  COUNTS:  YES  DICTATION: .Dragon Dictation  PLAN OF CARE: Admit to inpatient   PATIENT DISPOSITION:  PACU - hemodynamically stable.   Delay start of Pharmacological VTE agent (>24hrs) due to surgical blood loss or risk of bleeding: yes

## 2019-01-19 NOTE — Op Note (Signed)
      [address]       [place] [address]             [phone]        01/19/2019  Patient:  Jennifer [name] Pre-Op Dx: Status post left upper lobectomy with prolonged air leak Post-op Dx: Same Procedure:  -Redo left video assisted thoracoscopy -Left lower lobe wedge resection   Surgeon and Role:      * Beautiful Pensyl, Lucile Crater, MD - Primary    *Joellyn Rued, PA-C- assisting  EBL: Minimal  Blood Administration: None Specimen: Lower lobe wedge  Drains: 32 French pigtail in left chest Counts: correct   Indications: 73 year old female who is status post left VATS left upper lobectomy for lung cancer.  She has had a prolonged air leak was brought to the operating room for third time after undergoing endobronchial valve placement for a wedge resection. Findings: Raw surface of the fissure was noted and there was a bulla that was evident.  This was wedged out.  Operative Technique: After the risks, benefits and alternatives were thoroughly discussed, the patient was brought to the operative theatre.  Anesthesia was induced. The patient was then placed in a right decubitus position and was prepped and draped in normal sterile fashion.  An appropriate surgical pause was performed, and pre-operative antibiotics were dosed accordingly.  We began by opening her previous superior incision and entered the chest bluntly.  Upon entry there was some adhesions that had to be lysed to mobilize the lung.  There was a large bulla that was noted in the fissure along the superior segment.  We initially tried to control this with progel but she continued to have an air leak.  We then performed a wedge resection with a covered staple load.  Another air leak test was performed and there was no evidence of leak.  The previous chest tube was removed and the incision was closed with interrupted Prolene suture.  Local anesthesia was then injected into the incision.  A 14 French pigtail catheter  was then inserted under direct visualization.   The skin and soft tissue were closed with absorbable suture    The patient tolerated the procedure without any immediate complications, and was transferred to the PACU in stable condition.  [name]

## 2019-01-19 NOTE — Anesthesia Preprocedure Evaluation (Signed)
Anesthesia Evaluation  Patient identified by MRN, date of birth, ID band Patient awake    Reviewed: Allergy & Precautions, NPO status , Patient's Chart, lab work & pertinent test results  Airway Mallampati: II  TM Distance: >3 FB Neck ROM: Full    Dental  (+) Teeth Intact, Dental Advisory Given   Pulmonary Patient abstained from smoking., former smoker,    + rhonchi        Cardiovascular hypertension,  Rhythm:Regular Rate:Normal     Neuro/Psych    GI/Hepatic   Endo/Other    Renal/GU      Musculoskeletal   Abdominal   Peds  Hematology   Anesthesia Other Findings   Reproductive/Obstetrics                             Anesthesia Physical Anesthesia Plan  ASA: III  Anesthesia Plan: General   Post-op Pain Management:    Induction: Intravenous  PONV Risk Score and Plan: Ondansetron and Dexamethasone  Airway Management Planned: Double Lumen EBT  Additional Equipment:   Intra-op Plan:   Post-operative Plan: Extubation in OR  Informed Consent: I have reviewed the patients History and Physical, chart, labs and discussed the procedure including the risks, benefits and alternatives for the proposed anesthesia with the patient or authorized representative who has indicated his/her understanding and acceptance.     Dental advisory given  Plan Discussed with: CRNA and Anesthesiologist  Anesthesia Plan Comments:         Anesthesia Quick Evaluation

## 2019-01-20 ENCOUNTER — Encounter (HOSPITAL_COMMUNITY): Payer: Self-pay | Admitting: Thoracic Surgery (Cardiothoracic Vascular Surgery)

## 2019-01-20 ENCOUNTER — Inpatient Hospital Stay (HOSPITAL_COMMUNITY): Payer: Medicare Other

## 2019-01-20 LAB — BLOOD GAS, ARTERIAL
Acid-Base Excess: 1.2 mmol/L (ref 0.0–2.0)
Bicarbonate: 25.2 mmol/L (ref 20.0–28.0)
Drawn by: 51133
FIO2: 21
O2 Saturation: 96.8 %
Patient temperature: 37
pCO2 arterial: 39.6 mmHg (ref 32.0–48.0)
pH, Arterial: 7.421 (ref 7.350–7.450)
pO2, Arterial: 86.9 mmHg (ref 83.0–108.0)

## 2019-01-20 LAB — CBC
HCT: 31.6 % — ABNORMAL LOW (ref 36.0–46.0)
Hemoglobin: 10.4 g/dL — ABNORMAL LOW (ref 12.0–15.0)
MCH: 31.3 pg (ref 26.0–34.0)
MCHC: 32.9 g/dL (ref 30.0–36.0)
MCV: 95.2 fL (ref 80.0–100.0)
Platelets: 314 10*3/uL (ref 150–400)
RBC: 3.32 MIL/uL — ABNORMAL LOW (ref 3.87–5.11)
RDW: 12.3 % (ref 11.5–15.5)
WBC: 13.5 10*3/uL — ABNORMAL HIGH (ref 4.0–10.5)
nRBC: 0 % (ref 0.0–0.2)

## 2019-01-20 LAB — GLUCOSE, CAPILLARY
Glucose-Capillary: 110 mg/dL — ABNORMAL HIGH (ref 70–99)
Glucose-Capillary: 111 mg/dL — ABNORMAL HIGH (ref 70–99)

## 2019-01-20 LAB — BASIC METABOLIC PANEL
Anion gap: 8 (ref 5–15)
BUN: 19 mg/dL (ref 8–23)
CO2: 25 mmol/L (ref 22–32)
Calcium: 8.7 mg/dL — ABNORMAL LOW (ref 8.9–10.3)
Chloride: 104 mmol/L (ref 98–111)
Creatinine, Ser: 1 mg/dL (ref 0.44–1.00)
GFR calc Af Amer: >60 mL/min (ref >60)
GFR calc non Af Amer: 56 mL/min — ABNORMAL LOW (ref >60)
Glucose, Bld: 123 mg/dL — ABNORMAL HIGH (ref 70–99)
Potassium: 5 mmol/L (ref 3.5–5.1)
Sodium: 137 mmol/L (ref 135–145)

## 2019-01-20 NOTE — Progress Notes (Signed)
      [address]       [place] [address]             [phone]      1 Day Post-Op Procedure(s) (LRB): VIDEO ASSISTED THORACOSCOPY (Left) Chest Tube Insertion (Left) Mechanical Pleuradesis (Left) Subjective: Feels good this morning. No complaints.   Objective: Vital signs in last 24 hours: Temp:  [97.7 F (36.5 C)-97.9 F (36.6 C)] 97.8 F (36.6 C) (11/24 0317) Pulse Rate:  [57-88] 88 (11/24 0317) Cardiac Rhythm: Normal sinus rhythm (11/24 0415) Resp:  [14-23] 23 (11/24 0317) BP: (120-137)/(53-68) 120/60 (11/24 0317) SpO2:  [98 %-100 %] 98 % (11/24 0317)     Intake/Output from previous day: 11/23 0701 - 11/24 0700 In: 1200 [I.V.:1200] Out: 103 [Blood:15; Chest Tube:88] Intake/Output this shift: No intake/output data recorded.  General appearance: alert, cooperative and no distress Heart: regular rate and rhythm, S1, S2 normal, no murmur, click, rub or gallop Lungs: clear to auscultation bilaterally Abdomen: soft, non-tender; bowel sounds normal; no masses,  no organomegaly Extremities: extremities normal, atraumatic, no cyanosis or edema Wound: clean and dry  Lab Results: Recent Labs    01/20/19 0236  WBC 13.5*  HGB 10.4*  HCT 31.6*  PLT 314   BMET:  Recent Labs    01/20/19 0236  NA 137  K 5.0  CL 104  CO2 25  GLUCOSE 123*  BUN 19  CREATININE 1.00  CALCIUM 8.7*    PT/INR: No results for input(s): LABPROT, INR in the last 72 hours. ABG    Component Value Date/Time   PHART 7.421 01/20/2019 0430   HCO3 25.2 01/20/2019 0430   O2SAT 96.8 01/20/2019 0430   CBG (last 3)  Recent Labs    01/19/19 1708 01/19/19 2106 01/20/19 0636  GLUCAP 143* 155* 110*    Assessment/Plan: S/P Procedure(s) (LRB): VIDEO ASSISTED THORACOSCOPY (Left) Chest Tube Insertion (Left) Mechanical Pleuradesis (Left)  1. Pigtail chest tube in good position. No air leak this morning. Stat chest xray ordered. Will await CXR and if stable might be able  to d/c the chest tube.   2. PA/lat for the morning. Possibly ready for home tomorrow if remains stable      LOS: 22 days    Elgie Collard 01/20/2019

## 2019-01-20 NOTE — Progress Notes (Addendum)
      [address]       ,[place] [address]             [phone]       CXR reviewed. There is no large change in the CXR this morning. There was a slight improvement in the apical component of the left loculated pneumo. Report listed below. Will remove left-sided pigtail catheter.   CLINICAL DATA:  Air leak, chest tube.  EXAM: PORTABLE CHEST 1 VIEW  COMPARISON:  Chest radiograph 01/19/2019  FINDINGS: Redemonstrated left-sided chest tube now projecting over the left mid lung. The basilar component of the loculated left-sided pneumothorax has not significantly changed. There has been interval decrease in conspicuity of the apical component of the pneumothorax since prior exam. Persistent subcutaneous emphysema along the left lateral chest wall/axilla. Status post left thoracotomy. Bronchial valves again overlie the left hilum.  Heart size within normal limits. No airspace consolidation. No sizable pleural effusion. No acute bony abnormality.  IMPRESSION: Redemonstrated left-sided chest tube projecting over the left mid lung. The apical component of a loculated left pneumothorax has decreased in conspicuity. The basilar component is unchanged.  No airspace consolidation.   Electronically Signed   By: Kellie Simmering DO   On: 01/20/2019 08:27    Procedure: Left-sided pigtail catheter removal.    No air leak but + fluid wave.  Left sided pigtail catheter removed. Patient tolerated the procedure well. Incision was dressed with Vaseline gauze, dry 4 x 4 gauze and tape.   Complications: none.  Post-procedure: Patient states that she feels much better and is able to move around the room more easily.    PA/Lat in the AM.   Nicholes Rough, PA-C

## 2019-01-20 NOTE — Plan of Care (Signed)
  Problem: Education: Goal: Knowledge of disease or condition will improve Outcome: Progressing Goal: Knowledge of the prescribed therapeutic regimen will improve Outcome: Progressing   Problem: Activity: Goal: Risk for activity intolerance will decrease Outcome: Progressing   Problem: Respiratory: Goal: Respiratory status will improve Outcome: Progressing   Problem: Pain Management: Goal: Pain level will decrease Outcome: Progressing   Problem: Activity: Goal: Risk for activity intolerance will decrease Outcome: Progressing   Problem: Coping: Goal: Level of anxiety will decrease Outcome: Progressing

## 2019-01-21 ENCOUNTER — Inpatient Hospital Stay (HOSPITAL_COMMUNITY): Payer: Medicare Other

## 2019-01-21 LAB — COMPREHENSIVE METABOLIC PANEL
ALT: 12 U/L (ref 0–44)
AST: 12 U/L — ABNORMAL LOW (ref 15–41)
Albumin: 2.7 g/dL — ABNORMAL LOW (ref 3.5–5.0)
Alkaline Phosphatase: 80 U/L (ref 38–126)
Anion gap: 7 (ref 5–15)
BUN: 17 mg/dL (ref 8–23)
CO2: 26 mmol/L (ref 22–32)
Calcium: 8.6 mg/dL — ABNORMAL LOW (ref 8.9–10.3)
Chloride: 104 mmol/L (ref 98–111)
Creatinine, Ser: 1.05 mg/dL — ABNORMAL HIGH (ref 0.44–1.00)
GFR calc Af Amer: >60 mL/min (ref >60)
GFR calc non Af Amer: 53 mL/min — ABNORMAL LOW (ref >60)
Glucose, Bld: 102 mg/dL — ABNORMAL HIGH (ref 70–99)
Potassium: 5.3 mmol/L — ABNORMAL HIGH (ref 3.5–5.1)
Sodium: 137 mmol/L (ref 135–145)
Total Bilirubin: 0.5 mg/dL (ref 0.3–1.2)
Total Protein: 5.4 g/dL — ABNORMAL LOW (ref 6.5–8.1)

## 2019-01-21 LAB — CBC
HCT: 30.9 % — ABNORMAL LOW (ref 36.0–46.0)
Hemoglobin: 10.1 g/dL — ABNORMAL LOW (ref 12.0–15.0)
MCH: 31.8 pg (ref 26.0–34.0)
MCHC: 32.7 g/dL (ref 30.0–36.0)
MCV: 97.2 fL (ref 80.0–100.0)
Platelets: 277 10*3/uL (ref 150–400)
RBC: 3.18 MIL/uL — ABNORMAL LOW (ref 3.87–5.11)
RDW: 12.4 % (ref 11.5–15.5)
WBC: 10.6 10*3/uL — ABNORMAL HIGH (ref 4.0–10.5)
nRBC: 0 % (ref 0.0–0.2)

## 2019-01-21 LAB — SURGICAL PATHOLOGY

## 2019-01-21 MED ORDER — CARBAMIDE PEROXIDE 6.5 % OT SOLN: 5.0000 [drp] | Freq: Two times a day (BID) | OTIC | 0 refills | Status: DC

## 2019-01-21 MED ORDER — GUAIFENESIN ER 600 MG PO TB12: 600.0000 mg | ORAL_TABLET | Freq: Two times a day (BID) | ORAL | Status: AC

## 2019-01-21 MED ORDER — ACETAMINOPHEN 500 MG PO TABS: 1000.0000 mg | ORAL_TABLET | Freq: Four times a day (QID) | ORAL | 0 refills | Status: DC

## 2019-01-21 MED ORDER — TRAMADOL HCL 50 MG PO TABS: 50.0000 mg | ORAL_TABLET | Freq: Two times a day (BID) | ORAL | 0 refills | Status: DC | PRN

## 2019-01-21 NOTE — Discharge Instructions (Signed)
Discharge Instructions:  1. You may shower, please wash incisions daily with soap and water and keep dry.  If you wish to cover wounds with dressing you may do so but please keep clean and change daily.  No tub baths or swimming until incisions have completely healed.  If your incisions become red or develop any drainage please call our office at [phone]  2. No Driving until cleared by Dr. Abran Duke office and you are no longer using narcotic pain medications  3. Monitor your weight daily.. Please use the same scale and weigh at same time... If you gain 3-5 lbs in 48 hours with associated lower extremity swelling, please contact our office at [phone]  4. Fever of 101.5 for at least 24 hours, please contact our office at [phone]  5. Activity- up as tolerated, please walk at least 3 times per day.  Avoid strenuous activity, no lifting, pushing, or pulling with your arms over 8-10 lbs for a minimum of 6 weeks  6. If any questions or concerns arise, please do not hesitate to contact our office at [phone]   Leave pigtail catheter site covered until the weekend. Then may shower. Continue dressing changes until you see Dr. Kipp Brood in the office.

## 2019-01-21 NOTE — Progress Notes (Signed)
      [address]       [place] [address]             [phone]      2 Days Post-Op Procedure(s) (LRB): VIDEO ASSISTED THORACOSCOPY (Left) Chest Tube Insertion (Left) Mechanical Pleuradesis (Left) Subjective: Feels good this morning. She is anxious to go home.   Objective: Vital signs in last 24 hours: Temp:  [97.8 F (36.6 C)-98.3 F (36.8 C)] 98.2 F (36.8 C) (11/25 0735) Pulse Rate:  [69-98] 98 (11/25 0735) Cardiac Rhythm: Normal sinus rhythm (11/24 1900) Resp:  [18-24] 18 (11/25 0735) BP: (115-137)/(53-89) 137/89 (11/25 0735) SpO2:  [94 %-100 %] 94 % (11/25 0735)     Intake/Output from previous day: 11/24 0701 - 11/25 0700 In: 480 [P.O.:480] Out: -  Intake/Output this shift: No intake/output data recorded.  General appearance: alert, cooperative and no distress Heart: regular rate and rhythm, S1, S2 normal, no murmur, click, rub or gallop Lungs: clear to auscultation bilaterally Abdomen: soft, non-tender; bowel sounds normal; no masses,  no organomegaly Extremities: extremities normal, atraumatic, no cyanosis or edema Wound: clean and dry  Lab Results: Recent Labs    01/20/19 0236 01/21/19 0220  WBC 13.5* 10.6*  HGB 10.4* 10.1*  HCT 31.6* 30.9*  PLT 314 277   BMET:  Recent Labs    01/20/19 0236 01/21/19 0220  NA 137 137  K 5.0 5.3*  CL 104 104  CO2 25 26  GLUCOSE 123* 102*  BUN 19 17  CREATININE 1.00 1.05*  CALCIUM 8.7* 8.6*    PT/INR: No results for input(s): LABPROT, INR in the last 72 hours. ABG    Component Value Date/Time   PHART 7.421 01/20/2019 0430   HCO3 25.2 01/20/2019 0430   O2SAT 96.8 01/20/2019 0430   CBG (last 3)  Recent Labs    01/19/19 2106 01/20/19 0636 01/20/19 1111  GLUCAP 155* 110* 111*    Assessment/Plan: S/P Procedure(s) (LRB): VIDEO ASSISTED THORACOSCOPY (Left) Chest Tube Insertion (Left) Mechanical Pleuradesis (Left)  1. Chest tube removed yesterday.  2. CXR this morning appears  stable. Will await official read from Radiology and for Dr. Kipp Brood to review before discharging 3. Labs are stable. Potassium slightly elevated.  4. Patient is tolerating room air without issue. She continues to use her incentive spirometer.    Plan: Possible discharge later today. Instructions reviewed. Suggested she wait a few days to shower and keep Vaseline dressing in place.      LOS: 23 days    Elgie Collard 01/21/2019

## 2019-01-21 NOTE — Plan of Care (Signed)
  Problem: Education: Goal: Knowledge of disease or condition will improve Outcome: Adequate for Discharge Goal: Knowledge of the prescribed therapeutic regimen will improve Outcome: Adequate for Discharge   Problem: Activity: Goal: Risk for activity intolerance will decrease Outcome: Adequate for Discharge   Problem: Cardiac: Goal: Will achieve and/or maintain hemodynamic stability Outcome: Adequate for Discharge   Problem: Clinical Measurements: Goal: Postoperative complications will be avoided or minimized Outcome: Adequate for Discharge   Problem: Respiratory: Goal: Respiratory status will improve Outcome: Adequate for Discharge   Problem: Pain Management: Goal: Pain level will decrease Outcome: Adequate for Discharge   Problem: Skin Integrity: Goal: Wound healing without signs and symptoms infection will improve Outcome: Adequate for Discharge   Problem: Education: Goal: Knowledge of General Education information will improve Description: Including pain rating scale, medication(s)/side effects and non-pharmacologic comfort measures Outcome: Adequate for Discharge   Problem: Health Behavior/Discharge Planning: Goal: Ability to manage health-related needs will improve Outcome: Adequate for Discharge   Problem: Clinical Measurements: Goal: Ability to maintain clinical measurements within normal limits will improve Outcome: Adequate for Discharge Goal: Will remain free from infection Outcome: Adequate for Discharge Goal: Diagnostic test results will improve Outcome: Adequate for Discharge Goal: Respiratory complications will improve Outcome: Adequate for Discharge Goal: Cardiovascular complication will be avoided Outcome: Adequate for Discharge   Problem: Activity: Goal: Risk for activity intolerance will decrease Outcome: Adequate for Discharge   Problem: Nutrition: Goal: Adequate nutrition will be maintained Outcome: Adequate for Discharge   Problem:  Coping: Goal: Level of anxiety will decrease Outcome: Adequate for Discharge   Problem: Elimination: Goal: Will not experience complications related to bowel motility Outcome: Adequate for Discharge Goal: Will not experience complications related to urinary retention Outcome: Adequate for Discharge   Problem: Pain Managment: Goal: General experience of comfort will improve Outcome: Adequate for Discharge   Problem: Safety: Goal: Ability to remain free from injury will improve Outcome: Adequate for Discharge   Problem: Skin Integrity: Goal: Risk for impaired skin integrity will decrease Outcome: Adequate for Discharge   Discharge instructions given to patient. Questions answered. Educated on new medication regimen, signs/symptoms, restrictions, and follow up appointments. Prescriptions [facility]. PIV DC, hemostasis achieved. Vital signs stable. All belongings sent home with patient.   Pt escorted by NT via wheelchair to private vehicle driven by family.

## 2019-01-25 NOTE — Anesthesia Postprocedure Evaluation (Signed)
Anesthesia Post Note  Patient: Jennifer [name]  Procedure(s) Performed: VIDEO ASSISTED THORACOSCOPY (Left Chest) Chest Tube Insertion (Left Chest) Mechanical Pleuradesis (Left Chest)     Patient location during evaluation: PACU Anesthesia Type: General Level of consciousness: awake and alert Pain management: pain level controlled Vital Signs Assessment: post-procedure vital signs reviewed and stable Respiratory status: spontaneous breathing, nonlabored ventilation, respiratory function stable and patient connected to nasal cannula oxygen Cardiovascular status: blood pressure returned to baseline and stable Postop Assessment: no apparent nausea or vomiting Anesthetic complications: no    Last Vitals:  Vitals:   01/21/19 0332 01/21/19 0735  BP: (!) 132/55 137/89  Pulse: 75 98  Resp: (!) 21 18  Temp: 36.6 C 36.8 C  SpO2: 98% 94%    Last Pain:  Vitals:   01/21/19 0735  TempSrc: Oral  PainSc: 0-No pain                 Brooke Payes COKER

## 2019-01-27 ENCOUNTER — Encounter (HOSPITAL_COMMUNITY): Payer: Self-pay | Admitting: Thoracic Surgery (Cardiothoracic Vascular Surgery)

## 2019-01-30 ENCOUNTER — Encounter: Payer: Self-pay | Admitting: *Deleted

## 2019-01-30 ENCOUNTER — Other Ambulatory Visit: Payer: Self-pay

## 2019-01-30 ENCOUNTER — Other Ambulatory Visit: Payer: Self-pay | Admitting: *Deleted

## 2019-01-30 ENCOUNTER — Encounter: Payer: Self-pay | Admitting: Thoracic Surgery (Cardiothoracic Vascular Surgery)

## 2019-01-30 ENCOUNTER — Ambulatory Visit (INDEPENDENT_AMBULATORY_CARE_PROVIDER_SITE_OTHER): Payer: Self-pay | Admitting: Thoracic Surgery (Cardiothoracic Vascular Surgery)

## 2019-01-30 VITALS — BP 174/84 | HR 120 | Temp 97.7°F | Resp 24 | Wt 153.0 lb

## 2019-01-30 DIAGNOSIS — C3412 Malignant neoplasm of upper lobe, left bronchus or lung: Secondary | ICD-10-CM

## 2019-01-30 DIAGNOSIS — IMO0002 Reserved for concepts with insufficient information to code with codable children: Secondary | ICD-10-CM

## 2019-01-30 DIAGNOSIS — J9382 Other air leak: Secondary | ICD-10-CM

## 2019-01-30 NOTE — Progress Notes (Signed)
      [address]       [place] [address]             [phone]        Kameko Milhouse [place] Medical Record #[id number] Date of Birth: 1946-02-26  Referring: Beverley Fiedler, * Primary Care: Beverley Fiedler, FNP Primary Cardiologist:No primary care provider on file.  Reason for visit:   follow-up  History of Present Illness:     Mrs Homen presents for her 1st follow-up.  She has done well.  She has some exertional dyspnea  Physical Exam: BP (!) 174/84 (BP Location: Right Arm)   Pulse (!) 120 Comment: regular rhythm  Temp 97.7 F (36.5 C) (Skin)   Resp (!) 24   Wt 153 lb (69.4 kg)   SpO2 94% Comment: RA  BMI 22.58 kg/m   Alert NAD Incision clean, well healed.  Stitches removed. Abdomen soft, ND no peripheral edema   Diagnostic Studies & Laboratory data:  Path: FINAL MICROSCOPIC DIAGNOSIS:   A. LUNG, LEFT UPPER LOBE, LOBECTOMY:  - Invasive adenocarcinoma, moderately differentiated, 3.2 cm  - Margins uninvolved by adenocarcinoma  - See oncology table and comment below   B. LYMPH NODE, LEVEL 5, EXCISION:  - No carcinoma identified in one lymph node (0/1)   C. LYMPH NODE, LEVEL 5 #2 , EXCISION:  - No carcinoma identified in one lymph node (0/1)   D. LYMPH NODE, LEVEL 6, EXCISION:  - No carcinoma identified in one lymph node (0/1)   E. LYMPH NODE, LEVEL 11, EXCISION:  - No carcinoma identified in one lymph node (0/1)   F. LYMPH NODE, LEVEL 11 #2, EXCISION:  - No carcinoma identified in one lymph node (0/1)   G. LYMPH NODE, LEVEL 12, EXCISION:  - No carcinoma identified in one lymph node (0/1)   H. LYMPH NODE, LEVEL 9, EXCISION:  - No carcinoma identified in one lymph node (0/1)     Assessment / Plan:   73 yo female with Stage Ib pT2, pN 0M0 adenocarcinoma of the LUL.  Prolonged air leak, with EBG placement  Will schedule EBV removal for Dec. 28th.   Lajuana Matte 01/30/2019 2:40 PM

## 2019-02-17 NOTE — Pre-Procedure Instructions (Signed)
Jennifer [name]  02/17/2019      [facility] [address], [place] [address] Phone: [phone] Fax: [phone]    Your procedure is scheduled on February 23, 2019.  Report to [facility] Admitting at 530 AM.  Call this number if you have problems the morning of surgery:  [phone]  Call [phone] if you have any questions prior to your surgery date Monday-Friday 8am-4pm    Remember:  Do not eat or drink after midnight.    Take these medicines the morning of surgery with A SIP OF WATER  Mucinex Tylenol-if needed  Beginning now, STOP taking any Aspirin (unless otherwise instructed by your surgeon), Aleve, Naproxen, Ibuprofen, Motrin, Advil, Goody's, BC's, all herbal medications, fish oil, and all vitamins   Day of surgery:   Do not wear jewelry, make-up or nail polish.  Do not wear lotions, powders, or perfumes, or deodorant.  Do not shave 48 hours prior to surgery.    Do not bring valuables to the hospital.  [facility] is not responsible for any belongings or valuables.  IF you are a smoker, DO NOT Smoke 24 hours prior to surgery   IF you wear a CPAP at night please bring your mask, tubing, and machine the morning of surgery    Remember that you must have someone to transport you home after your surgery, and remain with you for 24 hours if you are discharged the same day.  Contacts, dentures or bridgework may not be worn into surgery.  Leave your suitcase in the car.  After surgery it may be brought to your room.  For patients admitted to the hospital, discharge time will be determined by your treatment team.  Patients discharged the day of surgery will not be allowed to drive home.    [facility] [place]- Preparing For Surgery  Before surgery, you can play an important role. Because skin is not sterile, your skin needs to be as free of germs as possible. You can reduce the  number of germs on your skin by washing with CHG (chlorahexidine gluconate) Soap before surgery.  CHG is an antiseptic cleaner which kills germs and bonds with the skin to continue killing germs even after washing.    Oral Hygiene is also important to reduce your risk of infection.  Remember - BRUSH YOUR TEETH THE MORNING OF SURGERY WITH YOUR REGULAR TOOTHPASTE  Please do not use if you have an allergy to CHG or antibacterial soaps. If your skin becomes reddened/irritated stop using the CHG.  Do not shave (including legs and underarms) for at least 48 hours prior to first CHG shower. It is OK to shave your face.  Please follow these instructions carefully.   1. Shower the NIGHT BEFORE SURGERY and the MORNING OF SURGERY with CHG.   2. If you chose to wash your hair, wash your hair first as usual with your normal shampoo.  3. After you shampoo, rinse your hair and body thoroughly to remove the shampoo.  4. Use CHG as you would any other liquid soap. You can apply CHG directly to the skin and wash gently with a scrungie or a clean washcloth.   5. Apply the CHG Soap to your body ONLY FROM THE NECK DOWN.  Do not use on open wounds or open sores. Avoid contact with your eyes, ears, mouth and genitals (private parts). Wash Face and genitals (private parts)  with your normal soap.  6. Wash thoroughly, paying special attention to the area where your surgery will be performed.  7. Thoroughly rinse your body with warm water from the neck down.  8. DO NOT shower/wash with your normal soap after using and rinsing off the CHG Soap.  9. Pat yourself dry with a CLEAN TOWEL.  10. Wear CLEAN PAJAMAS to bed the night before surgery, wear comfortable clothes the morning of surgery  11. Place CLEAN SHEETS on your bed the night of your first shower and DO NOT SLEEP WITH PETS.  Day of Surgery: Shower as above Do not apply any deodorants/lotions.  Please wear clean clothes to the hospital/surgery center.    Remember to brush your teeth WITH YOUR REGULAR TOOTHPASTE.  Please read over the following fact sheets that you were given.

## 2019-02-18 ENCOUNTER — Other Ambulatory Visit: Payer: Self-pay

## 2019-02-18 ENCOUNTER — Encounter (HOSPITAL_COMMUNITY): Payer: Self-pay

## 2019-02-18 ENCOUNTER — Encounter (HOSPITAL_COMMUNITY)
Admission: RE | Admit: 2019-02-18 | Discharge: 2019-02-18 | Disposition: A | Discharge status: Home / Self Care | Payer: Medicare Other | Source: Ambulatory Visit | Attending: Thoracic Surgery (Cardiothoracic Vascular Surgery) | Admitting: Thoracic Surgery (Cardiothoracic Vascular Surgery)

## 2019-02-18 DIAGNOSIS — Z01812 Encounter for preprocedural laboratory examination: Secondary | ICD-10-CM | POA: Diagnosis not present

## 2019-02-18 DIAGNOSIS — J9382 Other air leak: Secondary | ICD-10-CM | POA: Insufficient documentation

## 2019-02-18 DIAGNOSIS — IMO0002 Reserved for concepts with insufficient information to code with codable children: Secondary | ICD-10-CM

## 2019-02-18 LAB — SURGICAL PCR SCREEN
MRSA, PCR: NEGATIVE
Staphylococcus aureus: NEGATIVE

## 2019-02-18 LAB — PROTIME-INR
INR: 1 (ref 0.8–1.2)
Prothrombin Time: 13.4 seconds (ref 11.4–15.2)

## 2019-02-18 LAB — COMPREHENSIVE METABOLIC PANEL
ALT: 13 U/L (ref 0–44)
AST: 15 U/L (ref 15–41)
Albumin: 3.3 g/dL — ABNORMAL LOW (ref 3.5–5.0)
Alkaline Phosphatase: 122 U/L (ref 38–126)
Anion gap: 11 (ref 5–15)
BUN: 9 mg/dL (ref 8–23)
CO2: 24 mmol/L (ref 22–32)
Calcium: 9.2 mg/dL (ref 8.9–10.3)
Chloride: 105 mmol/L (ref 98–111)
Creatinine, Ser: 1.01 mg/dL — ABNORMAL HIGH (ref 0.44–1.00)
GFR calc Af Amer: >60 mL/min (ref >60)
GFR calc non Af Amer: 56 mL/min — ABNORMAL LOW (ref >60)
Glucose, Bld: 129 mg/dL — ABNORMAL HIGH (ref 70–99)
Potassium: 4.1 mmol/L (ref 3.5–5.1)
Sodium: 140 mmol/L (ref 135–145)
Total Bilirubin: 0.6 mg/dL (ref 0.3–1.2)
Total Protein: 6.9 g/dL (ref 6.5–8.1)

## 2019-02-18 LAB — CBC
HCT: 36.2 % (ref 36.0–46.0)
Hemoglobin: 11.1 g/dL — ABNORMAL LOW (ref 12.0–15.0)
MCH: 29 pg (ref 26.0–34.0)
MCHC: 30.7 g/dL (ref 30.0–36.0)
MCV: 94.5 fL (ref 80.0–100.0)
Platelets: 319 10*3/uL (ref 150–400)
RBC: 3.83 MIL/uL — ABNORMAL LOW (ref 3.87–5.11)
RDW: 12.7 % (ref 11.5–15.5)
WBC: 8.3 10*3/uL (ref 4.0–10.5)
nRBC: 0 % (ref 0.0–0.2)

## 2019-02-18 LAB — TYPE AND SCREEN
ABO/RH(D): A POS
Antibody Screen: NEGATIVE

## 2019-02-18 LAB — APTT: aPTT: 25 seconds (ref 24–36)

## 2019-02-18 NOTE — Progress Notes (Signed)
[facility]  Cardiologist - Denies  Chest x-ray - 02/23/2019-DOS  EKG - 12/09/2018 (E)  Stress Test - 12/22/2018 (E)  ECHO - Denies  Cardiac Cath - Denies  AICD-na PM-na LOOP-na  Sleep Study - Denies CPAP - Denies  LABS- 02/18/2019: CBC, CMP, PT, PTT, T/S, PCR 02/21/2019: COVID  ASA- Denies  ERAS- No  HA1C- Denies Fasting Blood Sugar -  Checks Blood Sugar _____ times a day  Anesthesia- No  Pt denies having chest pain, sob, or fever at this time. All instructions explained to the pt, with a verbal understanding of the material. Pt agrees to go over the instructions while at home for a better understanding. Pt also instructed to self quarantine after being tested for COVID-19. The opportunity to ask questions was provided.   Coronavirus Screening  Have you experienced the following symptoms:  Cough yes/no: No Fever (>100.8F)  yes/no: No Runny nose yes/no: No Sore throat yes/no: No Difficulty breathing/shortness of breath  yes/no: No  Have you or a family member traveled in the last 14 days and where? yes/no: No   If the patient indicates "YES" to the above questions, their PAT will be rescheduled to limit the exposure to others and, the surgeon will be notified. THE PATIENT WILL NEED TO BE ASYMPTOMATIC FOR 14 DAYS.   If the patient is not experiencing any of these symptoms, the PAT nurse will instruct them to NOT bring anyone with them to their appointment since they may have these symptoms or traveled as well.   Please remind your patients and families that hospital visitation restrictions are in effect and the importance of the restrictions.

## 2019-02-21 ENCOUNTER — Other Ambulatory Visit (HOSPITAL_COMMUNITY)
Admission: RE | Admit: 2019-02-21 | Discharge: 2019-02-21 | Disposition: A | Discharge status: Home / Self Care | Payer: Medicare Other | Source: Ambulatory Visit | Attending: Thoracic Surgery (Cardiothoracic Vascular Surgery) | Admitting: Thoracic Surgery (Cardiothoracic Vascular Surgery)

## 2019-02-21 DIAGNOSIS — J9382 Other air leak: Secondary | ICD-10-CM | POA: Insufficient documentation

## 2019-02-21 DIAGNOSIS — Z01812 Encounter for preprocedural laboratory examination: Secondary | ICD-10-CM | POA: Insufficient documentation

## 2019-02-21 DIAGNOSIS — Z20828 Contact with and (suspected) exposure to other viral communicable diseases: Secondary | ICD-10-CM | POA: Insufficient documentation

## 2019-02-21 DIAGNOSIS — IMO0002 Reserved for concepts with insufficient information to code with codable children: Secondary | ICD-10-CM

## 2019-02-21 LAB — SARS CORONAVIRUS 2 (TAT 6-24 HRS): SARS Coronavirus 2: NEGATIVE

## 2019-02-22 NOTE — Anesthesia Preprocedure Evaluation (Addendum)
Anesthesia Evaluation  Patient identified by MRN, date of birth, ID band Patient awake    Reviewed: Allergy & Precautions, NPO status , Patient's Chart, lab work & pertinent test results  History of Anesthesia Complications Negative for: history of anesthetic complications  Airway Mallampati: II  TM Distance: >3 FB Neck ROM: Full    Dental  (+) Dental Advisory Given, Teeth Intact   Pulmonary former smoker,   Persistent air leak    Pulmonary exam normal        Cardiovascular hypertension, Pt. on medications Normal cardiovascular exam   '20 Myoperfusion - Nuclear stress EF: 74%. The left ventricular ejection fraction is hyperdynamic (>65%). There was no ST segment deviation noted during stress. This is a low risk study. No evidence of previous infarction or ischemia. The study is normal.    Neuro/Psych negative neurological ROS  negative psych ROS   GI/Hepatic negative GI ROS, Neg liver ROS,   Endo/Other   Pre-DM   Renal/GU negative Renal ROS     Musculoskeletal negative musculoskeletal ROS (+)   Abdominal   Peds  Hematology negative hematology ROS (+)   Anesthesia Other Findings Covid neg 12/26  Reproductive/Obstetrics                            Anesthesia Physical Anesthesia Plan  ASA: II  Anesthesia Plan: General   Post-op Pain Management:    Induction: Intravenous  PONV Risk Score and Plan: 3 and Treatment may vary due to age or medical condition, Ondansetron and Dexamethasone  Airway Management Planned: Oral ETT  Additional Equipment: None  Intra-op Plan:   Post-operative Plan: Extubation in OR  Informed Consent: I have reviewed the patients History and Physical, chart, labs and discussed the procedure including the risks, benefits and alternatives for the proposed anesthesia with the patient or authorized representative who has indicated his/her understanding  and acceptance.     Dental advisory given  Plan Discussed with: CRNA and Anesthesiologist  Anesthesia Plan Comments:        Anesthesia Quick Evaluation

## 2019-02-23 ENCOUNTER — Ambulatory Visit (HOSPITAL_COMMUNITY): Payer: Medicare Other

## 2019-02-23 ENCOUNTER — Ambulatory Visit (HOSPITAL_COMMUNITY)
Admission: RE | Admit: 2019-02-23 | Discharge: 2019-02-23 | Disposition: A | Discharge status: Home / Self Care | Payer: Medicare Other | Attending: Thoracic Surgery (Cardiothoracic Vascular Surgery) | Admitting: Thoracic Surgery (Cardiothoracic Vascular Surgery)

## 2019-02-23 ENCOUNTER — Ambulatory Visit (HOSPITAL_COMMUNITY): Payer: Medicare Other | Admitting: Anesthesiology

## 2019-02-23 ENCOUNTER — Other Ambulatory Visit: Payer: Self-pay

## 2019-02-23 ENCOUNTER — Encounter (HOSPITAL_COMMUNITY)
Admission: RE | Disposition: A | Discharge status: Home / Self Care | Payer: Self-pay | Source: Home / Self Care | Attending: Thoracic Surgery (Cardiothoracic Vascular Surgery)

## 2019-02-23 ENCOUNTER — Encounter (HOSPITAL_COMMUNITY): Payer: Self-pay | Admitting: Thoracic Surgery (Cardiothoracic Vascular Surgery)

## 2019-02-23 DIAGNOSIS — Z4689 Encounter for fitting and adjustment of other specified devices: Secondary | ICD-10-CM | POA: Insufficient documentation

## 2019-02-23 DIAGNOSIS — J939 Pneumothorax, unspecified: Secondary | ICD-10-CM

## 2019-02-23 DIAGNOSIS — Z902 Acquired absence of lung [part of]: Secondary | ICD-10-CM | POA: Insufficient documentation

## 2019-02-23 DIAGNOSIS — I10 Essential (primary) hypertension: Secondary | ICD-10-CM | POA: Diagnosis not present

## 2019-02-23 DIAGNOSIS — E785 Hyperlipidemia, unspecified: Secondary | ICD-10-CM | POA: Insufficient documentation

## 2019-02-23 DIAGNOSIS — Z87891 Personal history of nicotine dependence: Secondary | ICD-10-CM | POA: Diagnosis not present

## 2019-02-23 DIAGNOSIS — R7303 Prediabetes: Secondary | ICD-10-CM | POA: Insufficient documentation

## 2019-02-23 DIAGNOSIS — Z886 Allergy status to analgesic agent status: Secondary | ICD-10-CM | POA: Insufficient documentation

## 2019-02-23 DIAGNOSIS — IMO0002 Reserved for concepts with insufficient information to code with codable children: Secondary | ICD-10-CM

## 2019-02-23 DIAGNOSIS — J9382 Other air leak: Secondary | ICD-10-CM

## 2019-02-23 HISTORY — PX: VIDEO BRONCHOSCOPY WITH INSERTION OF INTERBRONCHIAL VALVE (IBV): SHX6178

## 2019-02-23 SURGERY — BRONCHOSCOPY, FLEXIBLE, WITH INTRABRONCHIAL VALVE INSERTION
Anesthesia: General

## 2019-02-23 MED ORDER — EPHEDRINE SULFATE 50 MG/ML IJ SOLN
INTRAMUSCULAR | Status: DC | PRN
  Administered 2019-02-23 (×2): 5 mg via INTRAVENOUS

## 2019-02-23 MED ORDER — DEXAMETHASONE SODIUM PHOSPHATE 10 MG/ML IJ SOLN
INTRAMUSCULAR | Status: DC | PRN
  Administered 2019-02-23: 10 mg via INTRAVENOUS

## 2019-02-23 MED ORDER — LIDOCAINE 2% (20 MG/ML) 5 ML SYRINGE
INTRAMUSCULAR | Status: DC | PRN
  Administered 2019-02-23 (×2): 50 mg via INTRAVENOUS

## 2019-02-23 MED ORDER — LACTATED RINGERS IV SOLN: INTRAVENOUS | Status: DC | PRN

## 2019-02-23 MED ORDER — MIDAZOLAM HCL 2 MG/2ML IJ SOLN
INTRAMUSCULAR | Status: AC
  Filled 2019-02-23: qty 2

## 2019-02-23 MED ORDER — OXYCODONE HCL 5 MG PO TABS: 5.0000 mg | ORAL_TABLET | Freq: Once | ORAL | Status: DC | PRN

## 2019-02-23 MED ORDER — SUGAMMADEX SODIUM 200 MG/2ML IV SOLN
INTRAVENOUS | Status: DC | PRN
  Administered 2019-02-23: 200 mg via INTRAVENOUS

## 2019-02-23 MED ORDER — ONDANSETRON HCL 4 MG/2ML IJ SOLN
INTRAMUSCULAR | Status: DC | PRN
  Administered 2019-02-23: 4 mg via INTRAVENOUS

## 2019-02-23 MED ORDER — PROPOFOL 10 MG/ML IV BOLUS
INTRAVENOUS | Status: AC
  Filled 2019-02-23: qty 20

## 2019-02-23 MED ORDER — FENTANYL CITRATE (PF) 100 MCG/2ML IJ SOLN: 25.0000 ug | INTRAMUSCULAR | Status: DC | PRN

## 2019-02-23 MED ORDER — FENTANYL CITRATE (PF) 250 MCG/5ML IJ SOLN
INTRAMUSCULAR | Status: DC | PRN
  Administered 2019-02-23 (×3): 50 ug via INTRAVENOUS

## 2019-02-23 MED ORDER — OXYCODONE HCL 5 MG/5ML PO SOLN: 5.0000 mg | Freq: Once | ORAL | Status: DC | PRN

## 2019-02-23 MED ORDER — 0.9 % SODIUM CHLORIDE (POUR BTL) OPTIME
TOPICAL | Status: DC | PRN
  Administered 2019-02-23: 1000 mL

## 2019-02-23 MED ORDER — FENTANYL CITRATE (PF) 250 MCG/5ML IJ SOLN
INTRAMUSCULAR | Status: AC
  Filled 2019-02-23: qty 5

## 2019-02-23 MED ORDER — ONDANSETRON HCL 4 MG/2ML IJ SOLN: 4.0000 mg | Freq: Once | INTRAMUSCULAR | Status: DC | PRN

## 2019-02-23 MED ORDER — MIDAZOLAM HCL 5 MG/5ML IJ SOLN
INTRAMUSCULAR | Status: DC | PRN
  Administered 2019-02-23: 1 mg via INTRAVENOUS

## 2019-02-23 MED ORDER — PROPOFOL 10 MG/ML IV BOLUS
INTRAVENOUS | Status: DC | PRN
  Administered 2019-02-23: 50 mg via INTRAVENOUS
  Administered 2019-02-23: 100 mg via INTRAVENOUS

## 2019-02-23 MED ORDER — ROCURONIUM BROMIDE 10 MG/ML (PF) SYRINGE
PREFILLED_SYRINGE | INTRAVENOUS | Status: DC | PRN
  Administered 2019-02-23: 50 mg via INTRAVENOUS
  Administered 2019-02-23: 20 mg via INTRAVENOUS

## 2019-02-23 MED ORDER — PHENYLEPHRINE HCL-NACL 10-0.9 MG/250ML-% IV SOLN
INTRAVENOUS | Status: DC | PRN
  Administered 2019-02-23: 25 ug/min via INTRAVENOUS

## 2019-02-23 SURGICAL SUPPLY — 34 items
BLADE CLIPPER SURG (BLADE) IMPLANT
CANISTER SUCT 3000ML PPV (MISCELLANEOUS) ×3 IMPLANT
CONT SPEC 4OZ CLIKSEAL STRL BL (MISCELLANEOUS) ×3 IMPLANT
COVER BACK TABLE 60X90IN (DRAPES) ×3 IMPLANT
COVER WAND RF STERILE (DRAPES) IMPLANT
FILTER STRAW FLUID ASPIR (MISCELLANEOUS) IMPLANT
FORCEPS BIOP RJ4 1.8 (CUTTING FORCEPS) ×3 IMPLANT
FORCEPS RADIAL JAW LRG 4 PULM (INSTRUMENTS) ×1 IMPLANT
GAUZE SPONGE 4X4 12PLY STRL (GAUZE/BANDAGES/DRESSINGS) ×3 IMPLANT
GLOVE BIO SURGEON STRL SZ7 (GLOVE) IMPLANT
GLOVE BIO SURGEON STRL SZ7.5 (GLOVE) IMPLANT
GOWN STRL REUS W/ TWL XL LVL3 (GOWN DISPOSABLE) ×1 IMPLANT
GOWN STRL REUS W/TWL XL LVL3 (GOWN DISPOSABLE) ×2
KIT CLEAN ENDO COMPLIANCE (KITS) ×6 IMPLANT
KIT TURNOVER KIT B (KITS) ×3 IMPLANT
MARKER SKIN DUAL TIP RULER LAB (MISCELLANEOUS) ×3 IMPLANT
NS IRRIG 1000ML POUR BTL (IV SOLUTION) ×3 IMPLANT
OIL SILICONE PENTAX (PARTS (SERVICE/REPAIRS)) ×3 IMPLANT
PAD ARMBOARD 7.5X6 YLW CONV (MISCELLANEOUS) ×6 IMPLANT
RADIAL JAW LRG 4 PULMONARY (INSTRUMENTS) ×2
STOPCOCK MORSE 400PSI 3WAY (MISCELLANEOUS) IMPLANT
SYR 10ML LL (SYRINGE) IMPLANT
SYR 20ML ECCENTRIC (SYRINGE) ×6 IMPLANT
TOWEL GREEN STERILE (TOWEL DISPOSABLE) ×3 IMPLANT
TOWEL GREEN STERILE FF (TOWEL DISPOSABLE) ×3 IMPLANT
TOWEL NATURAL 4PK STERILE (DISPOSABLE) ×3 IMPLANT
TRAP SPECIMEN MUCOUS 40CC (MISCELLANEOUS) IMPLANT
TUBE CONNECTING 20'X1/4 (TUBING) ×1
TUBE CONNECTING 20X1/4 (TUBING) ×2 IMPLANT
UNDERPAD 30X30 (UNDERPADS AND DIAPERS) ×3 IMPLANT
VALVE BIOPSY  SINGLE USE (MISCELLANEOUS) ×2
VALVE BIOPSY SINGLE USE (MISCELLANEOUS) ×1 IMPLANT
VALVE SUCTION BRONCHIO DISP (MISCELLANEOUS) ×3 IMPLANT
WATER STERILE IRR 1000ML POUR (IV SOLUTION) ×3 IMPLANT

## 2019-02-23 NOTE — H&P (Signed)
[address]       [place] [address]             [phone]                    Kahliyah Saad [place] Medical Record #[id number] Date of Birth: 01/29/1946  Referring: No ref. provider found Primary Care: Beverley Fiedler, FNP Primary Cardiologist: No primary care provider on file.  Chief Complaint:   No chief complaint on file.   History of Present Illness:    Jennifer [name] 73 y.o. female s/p L VATS, LULectomy, and EBV placement for prolonged air leak.  She comes in today for valve removal.  She only complains of some dyspnea.    Past Medical History:  Diagnosis Date  . History of chicken pox   . Hyperlipidemia   . Hypertension   . Menopause   . Osteoporosis   . Pneumonia    "walking" pneumonia  . Pre-diabetes   . Vitamin D deficiency     Past Surgical History:  Procedure Laterality Date  . CHEST TUBE INSERTION Left 01/19/2019   Procedure: Chest Tube Insertion;  Surgeon: Lajuana Matte, MD;  Location: [place];  Service: Thoracic;  Laterality: Left;  . PLEURADESIS N/A 01/06/2019   Procedure: Pleuradesis - Chemical;  Surgeon: Lajuana Matte, MD;  Location: [place];  Service: Thoracic;  Laterality: N/A;  . PLEURADESIS Left 01/19/2019   Procedure: Mechanical Pleuradesis;  Surgeon: Lajuana Matte, MD;  Location: [facility];  Service: Thoracic;  Laterality: Left;  Marland Kitchen VIDEO ASSISTED THORACOSCOPY Left 01/19/2019   Procedure: VIDEO ASSISTED THORACOSCOPY;  Surgeon: Lajuana Matte, MD;  Location: [facility];  Service: Thoracic;  Laterality: Left;  Marland Kitchen VIDEO ASSISTED THORACOSCOPY (VATS)/ LOBECTOMY Left 12/29/2018   Procedure: VIDEO ASSISTED THORACOSCOPY (VATS)/LEFT UPPER  LOBECTOMY with Mediastinal lymph node exploration.;  Surgeon: Lajuana Matte, MD;  Location: [facility];  Service: Thoracic;  Laterality: Left;  Marland Kitchen VIDEO BRONCHOSCOPY N/A 12/29/2018   Procedure: VIDEO BRONCHOSCOPY;  Surgeon: Lajuana Matte, MD;  Location: [facility];  Service:  Thoracic;  Laterality: N/A;  . VIDEO BRONCHOSCOPY WITH ENDOBRONCHIAL NAVIGATION N/A 12/09/2018   Procedure: VIDEO BRONCHOSCOPY WITH ENDOBRONCHIAL NAVIGATION;  Surgeon: Lajuana Matte, MD;  Location: [place];  Service: Thoracic;  Laterality: N/A;  . VIDEO BRONCHOSCOPY WITH INSERTION OF INTERBRONCHIAL VALVE (IBV) N/A 01/06/2019   Procedure: VIDEO BRONCHOSCOPY WITH INSERTION OF INTERBRONCHIAL VALVE (IBV);  Surgeon: Lajuana Matte, MD;  Location: [place] [facility];  Service: Thoracic;  Laterality: N/A;  . VIDEO BRONCHOSCOPY WITH INSERTION OF INTERBRONCHIAL VALVE (IBV) N/A 01/13/2019   Procedure: VIDEO BRONCHOSCOPY WITH INSERTION OF INTERBRONCHIAL VALVE (IBV);  Surgeon: Lajuana Matte, MD;  Location: [place];  Service: Thoracic;  Laterality: N/A;    History reviewed. No pertinent family history.   Social History   Tobacco Use  Smoking Status Former Smoker  . Packs/day: 0.50  . Types: Cigarettes  Smokeless Tobacco Never Used  Tobacco Comment   has not had a cigarette since 12/04/18    Social History   Substance and Sexual Activity  Alcohol Use Yes  . Alcohol/week: 1.0 standard drinks  . Types: 1 Glasses of wine per week   Comment: daily     Allergies  Allergen Reactions  . Aspirin Hives  . Latex Rash    No current facility-administered medications for this encounter.    Review of Systems  Constitutional: Negative.   Respiratory: Positive  for shortness of breath.   Cardiovascular: Negative.   Musculoskeletal: Negative.     PHYSICAL EXAMINATION: BP (!) 182/65   Pulse (!) 105   Temp 98.3 F (36.8 C) (Oral)   Resp 18   SpO2 99%   Physical Exam  Constitutional: She is oriented to person, place, and time and well-developed, well-nourished, and in no distress. No distress.  HENT:  Head: Normocephalic and atraumatic.  Eyes: Conjunctivae are normal.  Neck: No tracheal deviation present.  Cardiovascular: Normal rate.  Pulmonary/Chest: Effort normal. No respiratory  distress.  Abdominal: She exhibits no distension.  Musculoskeletal:        General: Normal range of motion.  Neurological: She is alert and oriented to person, place, and time.  Skin: She is not diaphoretic.     Diagnostic Studies & Laboratory data:     Recent Radiology Findings:   DG Chest 2 View  Result Date: 02/23/2019 CLINICAL DATA:  Preop IBV removal EXAM: CHEST - 2 VIEW COMPARISON:  01/21/2019 FINDINGS: Postsurgical changes in the left lung. Left apical pneumothorax, decreasing. Prior subcutaneous emphysema has resolved. Right lung is clear. Heart is normal in size. No focal osseous lesions. IMPRESSION: Postsurgical changes in the left hemithorax with improving left apical pneumothorax. Electronically Signed   By: Julian Hy M.D.   On: 02/23/2019 07:08       I have independently reviewed the above radiology studies  and reviewed the findings with the patient.   Recent Lab Findings: Lab Results  Component Value Date   WBC 8.3 02/18/2019   HGB 11.1 (L) 02/18/2019   HCT 36.2 02/18/2019   PLT 319 02/18/2019   GLUCOSE 129 (H) 02/18/2019   CHOL 209 (A) 10/28/2009   TRIG 220 (A) 10/28/2009   HDL 41 10/28/2009   LDLCALC 124 10/28/2009   ALT 13 02/18/2019   AST 15 02/18/2019   NA 140 02/18/2019   K 4.1 02/18/2019   CL 105 02/18/2019   CREATININE 1.01 (H) 02/18/2019   BUN 9 02/18/2019   CO2 24 02/18/2019   INR 1.0 02/18/2019        Assessment / Plan:   S/p endobronchial valve placement for prolonged air leak following LULectomy OR today for valve removal      Tanishi Nault O Tonae Livolsi 02/23/2019 7:22 AM

## 2019-02-23 NOTE — Anesthesia Procedure Notes (Addendum)
Procedure Name: Intubation Date/Time: 02/23/2019 7:36 AM Performed by: Mariea Clonts, CRNA Pre-anesthesia Checklist: Patient identified, Emergency Drugs available, Suction available and Patient being monitored Patient Re-evaluated:Patient Re-evaluated prior to induction Oxygen Delivery Method: Circle System Utilized Preoxygenation: Pre-oxygenation with 100% oxygen Induction Type: IV induction Ventilation: Mask ventilation without difficulty Laryngoscope Size: Narula and 2 Grade View: Grade I Tube type: Oral Tube size: 8.5 mm Number of attempts: 1 Airway Equipment and Method: Stylet and Oral airway Placement Confirmation: ETT inserted through vocal cords under direct vision,  positive ETCO2 and breath sounds checked- equal and bilateral Tube secured with: Tape Dental Injury: Teeth and Oropharynx as per pre-operative assessment

## 2019-02-23 NOTE — Transfer of Care (Signed)
Immediate Anesthesia Transfer of Care Note  Patient: Jennifer [name]  Procedure(s) Performed: VIDEO BRONCHOSCOPY WITH REMOVAL OF INTERBRONCHIAL VALVE (IBV) (N/A )  Patient Location: PACU  Anesthesia Type:General  Level of Consciousness: awake, alert  and oriented  Airway & Oxygen Therapy: Patient Spontanous Breathing and Patient connected to nasal cannula oxygen  Post-op Assessment: Report given to RN, Post -op Vital signs reviewed and stable and Patient moving all extremities X 4  Post vital signs: Reviewed and stable  Last Vitals:  Vitals Value Taken Time  BP 118/52 02/23/19 0838  Temp    Pulse 95 02/23/19 0842  Resp 13 02/23/19 0842  SpO2 100 % 02/23/19 0842  Vitals shown include unvalidated device data.  Last Pain:  Vitals:   02/23/19 0611  TempSrc: Oral  PainSc: 0-No pain         Complications: No apparent anesthesia complications

## 2019-02-23 NOTE — Anesthesia Postprocedure Evaluation (Signed)
Anesthesia Post Note  Patient: Jennifer [name]  Procedure(s) Performed: VIDEO BRONCHOSCOPY WITH REMOVAL OF INTERBRONCHIAL VALVE (IBV) (N/A )     Patient location during evaluation: PACU Anesthesia Type: General Level of consciousness: awake and alert Pain management: pain level controlled Vital Signs Assessment: post-procedure vital signs reviewed and stable Respiratory status: spontaneous breathing, nonlabored ventilation and respiratory function stable Cardiovascular status: blood pressure returned to baseline and stable Postop Assessment: no apparent nausea or vomiting Anesthetic complications: no    Last Vitals:  Vitals:   02/23/19 0910 02/23/19 0925  BP: (!) 112/47 (!) 122/55  Pulse: 96 99  Resp: 15 17  Temp:    SpO2: 100% 100%    Last Pain:  Vitals:   02/23/19 0925  TempSrc:   PainSc: 0-No pain                 Audry Pili

## 2019-02-23 NOTE — Op Note (Signed)
      [address]       [place] [address]             [phone]        02/23/2019  Patient:  Jennifer [name] Pre-Op Dx: s/p left upper lobectomy with prolonged air leak requiring endobronchial valve placement   Post-op Dx:  same Procedure: Bronchoscopy   Removal of endobronchial valves X 4.   Surgeon and Role:      * Arling Cerone, Lucile Crater, MD - Primary  Anesthesia  general EBL:  Minimal  Blood Administration: none   Indications: 73 yo female s/p L VATS, left upper lobectomy in November of this year.  She 4 endobronchial valves placed in the left lower lobe for a prolonged air leak.  She was brought to the OR today for removal  Findings: Well healed stump.  All valves removed.  Minimal bleeding  Operative Technique: All invasive lines were placed in pre-op holding.  After the risks, benefits and alternatives were thoroughly discussed, the patient was brought to the operative theatre.  Anesthesia was induced, and the patient was prepped and draped in normal sterile fashion.  An appropriate surgical pause was performed.  Flexible fiberoptic bronchoscopy was performed via the endotracheal tube.  It revealed a well healed stump, and normal endobronchial anatomy with no endobronchial lesions to the level of the subsegmental bronchi.  The endobronchial valves were visualized, and removed sequentially.  A total of 4 valves were moved.  There was no significant bleeding.  The bronchoscope was removed.   The patient tolerated the procedure without any immediate complications, and was transferred to the PACU in good condition.  Arlisa Leclere Bary Leriche

## 2019-02-23 NOTE — Discharge Summary (Signed)
Physician Discharge Summary   Patient ID: Jennifer [name] [id number] 73 y.o. May 24, 1945  Admit date: 02/23/2019  Discharge date and time: No discharge date for patient encounter.   Admitting Physician: Lajuana Matte, MD   Discharge Physician: Lajuana Matte   Admission Diagnoses: IBV INSERTION DUE TO PERSISTANT AIRLEAK  Discharge Diagnoses: s/p EBV removal  Admission Condition: good  Discharged Condition: good  Hospital Course: uncomplicated   Disposition: Discharge disposition: 01-Home or Self Care        Activity: activity as tolerated Diet: regular diet Wound Care: none needed  Follow-up with Dr. Kipp Brood in 1 week.  SignedLajuana Matte 02/23/2019 9:50 AM

## 2019-02-24 ENCOUNTER — Encounter: Payer: Self-pay | Admitting: *Deleted

## 2019-03-05 ENCOUNTER — Other Ambulatory Visit: Payer: Self-pay | Admitting: Thoracic Surgery (Cardiothoracic Vascular Surgery)

## 2019-03-05 DIAGNOSIS — R911 Solitary pulmonary nodule: Secondary | ICD-10-CM

## 2019-03-06 ENCOUNTER — Ambulatory Visit: Payer: Medicare Other | Admitting: Thoracic Surgery (Cardiothoracic Vascular Surgery)

## 2019-03-06 ENCOUNTER — Other Ambulatory Visit: Payer: Self-pay

## 2019-03-06 ENCOUNTER — Ambulatory Visit
Admission: RE | Admit: 2019-03-06 | Discharge: 2019-03-06 | Disposition: A | Discharge status: Home / Self Care | Payer: Medicare Other | Source: Ambulatory Visit | Attending: Thoracic Surgery (Cardiothoracic Vascular Surgery) | Admitting: Thoracic Surgery (Cardiothoracic Vascular Surgery)

## 2019-03-06 ENCOUNTER — Ambulatory Visit: Payer: Self-pay | Admitting: Thoracic Surgery (Cardiothoracic Vascular Surgery)

## 2019-03-06 ENCOUNTER — Encounter: Payer: Medicare Other | Admitting: Thoracic Surgery (Cardiothoracic Vascular Surgery)

## 2019-03-06 DIAGNOSIS — R911 Solitary pulmonary nodule: Secondary | ICD-10-CM

## 2019-03-18 ENCOUNTER — Other Ambulatory Visit: Payer: Self-pay | Admitting: Thoracic Surgery (Cardiothoracic Vascular Surgery)

## 2019-03-18 DIAGNOSIS — R911 Solitary pulmonary nodule: Secondary | ICD-10-CM

## 2019-03-20 ENCOUNTER — Encounter: Payer: Self-pay | Admitting: Thoracic Surgery (Cardiothoracic Vascular Surgery)

## 2019-03-20 ENCOUNTER — Ambulatory Visit (INDEPENDENT_AMBULATORY_CARE_PROVIDER_SITE_OTHER): Payer: Self-pay | Admitting: Thoracic Surgery (Cardiothoracic Vascular Surgery)

## 2019-03-20 ENCOUNTER — Other Ambulatory Visit: Payer: Self-pay

## 2019-03-20 ENCOUNTER — Ambulatory Visit
Admission: RE | Admit: 2019-03-20 | Discharge: 2019-03-20 | Disposition: A | Discharge status: Home / Self Care | Payer: Medicare Other | Source: Ambulatory Visit | Attending: Thoracic Surgery (Cardiothoracic Vascular Surgery) | Admitting: Thoracic Surgery (Cardiothoracic Vascular Surgery)

## 2019-03-20 VITALS — BP 152/82 | HR 106 | Resp 20 | Ht 69.0 in | Wt 158.7 lb

## 2019-03-20 DIAGNOSIS — R911 Solitary pulmonary nodule: Secondary | ICD-10-CM

## 2019-03-20 DIAGNOSIS — Z902 Acquired absence of lung [part of]: Secondary | ICD-10-CM

## 2019-03-20 DIAGNOSIS — J95812 Postprocedural air leak: Secondary | ICD-10-CM

## 2019-03-20 NOTE — Progress Notes (Signed)
      [address]       [place] [address]             [phone]        Jennifer [name] [place] Medical Record #[id number] Date of Birth: 1945-12-29  Referring: Beverley Fiedler, * Primary Care: Beverley Fiedler, FNP Primary Cardiologist:No primary care provider on file.  Reason for visit:   follow-up  History of Present Illness:     Jennifer [name] has done well.  Her exercise tolerance continues to improve.  She does still have some residual exertional dyspnea with brisk activity  Physical Exam: BP (!) 152/82   Pulse (!) 106   Resp 20   Ht 5\' 9"  (1.753 m)   Wt 158 lb 11.2 oz (72 kg)   SpO2 96% Comment: on RA  BMI 23.44 kg/m   Alert NAD Incision well healed Abdomen soft, ND no peripheral edema   Diagnostic Studies & Laboratory data: CXR: residual apical pneumothorax, unchanged     Assessment / Plan:   74 yo female s/p LULectomy for Stage Ib pT2, pN 0M0 adenocarcinoma, and endobronchial valve removal for prolonged air leak  Will f/u in 6 months with surveillance CT chest   [facility] O Armel Rabbani 03/20/2019 12:52 PM

## 2019-04-09 ENCOUNTER — Ambulatory Visit: Payer: Medicare Other | Attending: Internal Medicine

## 2019-04-09 DIAGNOSIS — Z23 Encounter for immunization: Secondary | ICD-10-CM

## 2019-04-09 NOTE — Progress Notes (Signed)
   Covid-19 Vaccination Clinic  Name:  Jennifer [name]    MRN: [id number] DOB: October 11, 1945  04/09/2019  Jennifer [name] was observed post Covid-19 immunization for 15 minutes without incidence. She was provided with Vaccine Information Sheet and instruction to access the V-Safe system.   Jennifer [name] was instructed to call 911 with any severe reactions post vaccine: Marland Kitchen Difficulty breathing  . Swelling of your face and throat  . A fast heartbeat  . A bad rash all over your body  . Dizziness and weakness    Immunizations Administered    Name Date Dose VIS Date Route   Pfizer COVID-19 Vaccine 04/09/2019 11:11 AM 0.3 mL 02/06/2019 Intramuscular   Manufacturer: [place]   Lot: [id number]   [place]: [address]-3

## 2019-05-06 ENCOUNTER — Ambulatory Visit: Payer: Medicare Other | Attending: Internal Medicine

## 2019-05-06 DIAGNOSIS — Z23 Encounter for immunization: Secondary | ICD-10-CM

## 2019-05-06 NOTE — Progress Notes (Signed)
   Covid-19 Vaccination Clinic  Name:  Jennifer [name]    MRN: [id number] DOB: 10-30-1945  05/06/2019  Jennifer [name] was observed post Covid-19 immunization for 15 minutes without incident. She was provided with Vaccine Information Sheet and instruction to access the V-Safe system.   Jennifer [name] was instructed to call 911 with any severe reactions post vaccine: Marland Kitchen Difficulty breathing  . Swelling of face and throat  . A fast heartbeat  . A bad rash all over body  . Dizziness and weakness   Immunizations Administered    Name Date Dose VIS Date Route   Pfizer COVID-19 Vaccine 05/06/2019  3:50 PM 0.3 mL 02/06/2019 Intramuscular   Manufacturer: [place]   Lot: [id number]   [place]: [address]-3

## 2019-08-13 ENCOUNTER — Other Ambulatory Visit: Payer: Self-pay | Admitting: Thoracic Surgery (Cardiothoracic Vascular Surgery)

## 2019-08-13 DIAGNOSIS — R911 Solitary pulmonary nodule: Secondary | ICD-10-CM

## 2019-08-13 DIAGNOSIS — Z902 Acquired absence of lung [part of]: Secondary | ICD-10-CM

## 2019-09-25 ENCOUNTER — Encounter: Payer: Self-pay | Admitting: Thoracic Surgery (Cardiothoracic Vascular Surgery)

## 2019-09-25 ENCOUNTER — Other Ambulatory Visit: Payer: Self-pay

## 2019-09-25 ENCOUNTER — Ambulatory Visit
Admission: RE | Admit: 2019-09-25 | Discharge: 2019-09-25 | Disposition: A | Discharge status: Home / Self Care | Payer: Medicare Other | Source: Ambulatory Visit | Attending: Thoracic Surgery (Cardiothoracic Vascular Surgery) | Admitting: Thoracic Surgery (Cardiothoracic Vascular Surgery)

## 2019-09-25 ENCOUNTER — Ambulatory Visit (INDEPENDENT_AMBULATORY_CARE_PROVIDER_SITE_OTHER): Payer: Medicare Other | Admitting: Thoracic Surgery (Cardiothoracic Vascular Surgery)

## 2019-09-25 VITALS — BP 170/100 | HR 100 | Temp 97.9°F | Resp 20 | Ht 69.0 in | Wt 161.0 lb

## 2019-09-25 DIAGNOSIS — Z85118 Personal history of other malignant neoplasm of bronchus and lung: Secondary | ICD-10-CM | POA: Diagnosis not present

## 2019-09-25 DIAGNOSIS — Z902 Acquired absence of lung [part of]: Secondary | ICD-10-CM | POA: Diagnosis not present

## 2019-09-25 DIAGNOSIS — R911 Solitary pulmonary nodule: Secondary | ICD-10-CM

## 2019-09-25 DIAGNOSIS — E042 Nontoxic multinodular goiter: Secondary | ICD-10-CM | POA: Diagnosis not present

## 2019-09-25 NOTE — Progress Notes (Signed)
[address]       [place] [address]             [phone]                    Jaclin Lachney [place] Medical Record #[id number] Date of Birth: [birth year]  Referring: Beverley Fiedler, * Primary Care: Beverley Fiedler, FNP Primary Cardiologist: No primary care provider on file.  Chief Complaint:    Chief Complaint  Patient presents with  . Lung Cancer    6 month f/u with Chest CT    History of Present Illness:    Jennifer [name] 74 y.o. 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  Overall she is doing quite well.  Her dyspnea has drastically improved.  She remains tobacco free.      Zubrod Score: At the time of surgery this patient's most appropriate activity status/level should be described as: [x]     0    Normal activity, no symptoms []     1    Restricted in physical strenuous activity but ambulatory, able to do out light work []     2    Ambulatory and capable of self care, unable to do work activities, up and about               >50 % of waking hours                              []     3    Only limited self care, in bed greater than 50% of waking hours []     4    Completely disabled, no self care, confined to bed or chair []     5    Moribund   Past Medical History:  Diagnosis Date  . History of chicken pox   . Hyperlipidemia   . Hypertension   . Menopause   . Osteoporosis   . Pneumonia    "walking" pneumonia  . Pre-diabetes   . Vitamin D deficiency     Past Surgical History:  Procedure Laterality Date  . CHEST TUBE INSERTION Left 01/19/2019   Procedure: Chest Tube Insertion;  Surgeon: Lajuana Matte, MD;  Location: [facility];  Service: Thoracic;  Laterality: Left;  . PLEURADESIS N/A 01/06/2019   Procedure: Pleuradesis - Chemical;  Surgeon: Lajuana Matte, MD;  Location: [facility];  Service: Thoracic;  Laterality: N/A;  . PLEURADESIS Left  01/19/2019   Procedure: Mechanical Pleuradesis;  Surgeon: Lajuana Matte, MD;  Location: [facility];  Service: Thoracic;  Laterality: Left;  Marland Kitchen VIDEO ASSISTED THORACOSCOPY Left 01/19/2019   Procedure: VIDEO ASSISTED THORACOSCOPY;  Surgeon: Lajuana Matte, MD;  Location: [facility];  Service: Thoracic;  Laterality: Left;  Marland Kitchen VIDEO ASSISTED THORACOSCOPY (VATS)/ LOBECTOMY Left 12/29/2018   Procedure: VIDEO ASSISTED THORACOSCOPY (VATS)/LEFT UPPER  LOBECTOMY with Mediastinal lymph node exploration.;  Surgeon: Lajuana Matte, MD;  Location: [facility];  Service: Thoracic;  Laterality: Left;  Marland Kitchen VIDEO BRONCHOSCOPY N/A 12/29/2018   Procedure: VIDEO BRONCHOSCOPY;  Surgeon: Lajuana Matte, MD;  Location: [facility];  Service: Thoracic;  Laterality: N/A;  . VIDEO BRONCHOSCOPY WITH ENDOBRONCHIAL NAVIGATION N/A 12/09/2018   Procedure: VIDEO BRONCHOSCOPY WITH ENDOBRONCHIAL NAVIGATION;  Surgeon: Lajuana Matte, MD;  Location: [facility];  Service: Thoracic;  Laterality:  N/A;  . VIDEO BRONCHOSCOPY WITH INSERTION OF INTERBRONCHIAL VALVE (IBV) N/A 01/06/2019   Procedure: VIDEO BRONCHOSCOPY WITH INSERTION OF INTERBRONCHIAL VALVE (IBV);  Surgeon: Lajuana Matte, MD;  Location: [facility];  Service: Thoracic;  Laterality: N/A;  . VIDEO BRONCHOSCOPY WITH INSERTION OF INTERBRONCHIAL VALVE (IBV) N/A 01/13/2019   Procedure: VIDEO BRONCHOSCOPY WITH INSERTION OF INTERBRONCHIAL VALVE (IBV);  Surgeon: Lajuana Matte, MD;  [facility];  Service: Thoracic;  Laterality: N/A;  . VIDEO BRONCHOSCOPY WITH INSERTION OF INTERBRONCHIAL VALVE (IBV) N/A 02/23/2019   Procedure: VIDEO BRONCHOSCOPY WITH REMOVAL OF INTERBRONCHIAL VALVE (IBV);  Surgeon: Lajuana Matte, MD;  Location: [facility] OR;  Service: Thoracic;  Laterality: N/A;    No family history on file.   Social History   Tobacco Use  Smoking Status Former Smoker  . Packs/day: 0.50  . Types: Cigarettes  Smokeless Tobacco Never Used  Tobacco Comment   has not had  a cigarette since 12/04/18    Social History   Substance and Sexual Activity  Alcohol Use Yes  . Alcohol/week: 1.0 standard drink  . Types: 1 Glasses of wine per week   Comment: daily     Allergies  Allergen Reactions  . Aspirin Hives  . Latex Rash    Current Outpatient Medications  Medication Sig Dispense Refill  . atorvastatin (LIPITOR) 40 MG tablet Take 40 mg by mouth every evening.     Marland Kitchen guaiFENesin (MUCINEX) 600 MG 12 hr tablet Take 1 tablet (600 mg total) by mouth 2 (two) times daily. (Patient taking differently: Take 600 mg by mouth daily. )    . lisinopril (ZESTRIL) 30 MG tablet Take 40 mg by mouth daily.     . Calcium Carb-Cholecalciferol (CALCIUM 600 + D PO) Take 1 tablet by mouth daily. (Patient not taking: Reported on 09/25/2019)     No current facility-administered medications for this visit.    Review of Systems  All other systems reviewed and are negative.    PHYSICAL EXAMINATION: BP (!) 170/100   Pulse 100   Temp 97.9 F (36.6 C) (Skin)   Resp 20   Ht 5\' 9"  (1.753 m)   Wt 161 lb (73 kg)   SpO2 98% Comment: RA  BMI 23.78 kg/m  Physical Exam Constitutional:      Appearance: Normal appearance. She is normal weight.  HENT:     Head: Normocephalic and atraumatic.  Eyes:     Extraocular Movements: Extraocular movements intact.  Cardiovascular:     Rate and Rhythm: Normal rate and regular rhythm.  Pulmonary:     Effort: Pulmonary effort is normal. No respiratory distress.  Musculoskeletal:        General: Normal range of motion.     Cervical back: Normal range of motion.  Skin:    General: Skin is warm and dry.  Neurological:     General: No focal deficit present.     Mental Status: She is alert.     Diagnostic Studies & Laboratory data:     Recent Radiology Findings:   CT Chest Wo Contrast  Result Date: 09/25/2019 CLINICAL DATA:  Status post LEFT upper lobectomy for invasive adenocarcinoma. EXAM: CT CHEST WITHOUT CONTRAST TECHNIQUE:  Multidetector CT imaging of the chest was performed following the standard protocol without IV contrast. COMPARISON:  December 01, 2018. FINDINGS: Cardiovascular: Heart is normal in size. Trace pericardial fluid. Scattered atherosclerotic calcifications throughout the thoracic aorta. Mediastinum/Nodes: Multiple LEFT-sided thyroid nodules, similar comparison to prior. Preaortic lymph node measures  10 mm in the short axis, previously 2 mm (series 2, image 39). Lymph node at the AP window measures 10 mm in the short axis, previously 6 mm (series 2, image 51). Lungs/Pleura: Status post LEFT upper lobectomy with corresponding surgical changes. No nodularity along the surgical margins. LEFT basilar pleural thickening, likely postoperative. RIGHT apical pleuroparenchymal scarring, unchanged. Mild centrilobular and paraseptal emphysema. LEFT lower lobe bilobed nodule, possibly endobronchial debris measures 2 x 5 mm (series 8, image 51). RIGHT lower lobe fissural nodule, likely lymph node measures 5 x 3 mm, unchanged (series 8, image 91). Upper Abdomen: LEFT adrenal adenoma, unchanged. Musculoskeletal: No aggressive osseous lesion identified. Degenerative changes of the thoracic spine. IMPRESSION: 1. Status post LEFT upper lobectomy. No definitive evidence of recurrent or metastatic disease. 2. Mildly enlarged mediastinal lymph nodes, which are nonspecific and may be reactive. Attention on follow-up. 3. Scattered nonspecific pulmonary nodules. Recommend attention on follow-up as per clinical protocol. Aortic Atherosclerosis (ICD10-I70.0) and Emphysema (ICD10-J43.9). Electronically Signed   By: Valentino Saxon MD   On: 09/25/2019 10:16       I have independently reviewed the above radiology studies  and reviewed the findings with the patient.   Recent Lab Findings: Lab Results  Component Value Date   WBC 8.3 02/18/2019   HGB 11.1 (L) 02/18/2019   HCT 36.2 02/18/2019   PLT 319 02/18/2019   GLUCOSE 129 (H)  02/18/2019   CHOL 209 (A) 10/28/2009   TRIG 220 (A) 10/28/2009   HDL 41 10/28/2009   LDLCALC 124 10/28/2009   ALT 13 02/18/2019   AST 15 02/18/2019   NA 140 02/18/2019   K 4.1 02/18/2019   CL 105 02/18/2019   CREATININE 1.01 (H) 02/18/2019   BUN 9 02/18/2019   CO2 24 02/18/2019   INR 1.0 02/18/2019        Assessment / Plan:   74 year old female who underwent a left video-assisted thoracoscopy with left upper lobectomy in October 2020 for a stage Ib, T2 N0 M0 adenocarcinoma the lung.  Her operation was complicated by prolonged air leak which required endobronchial valve placement which has subsequently been removed.  I personally reviewed her CT scan, and she has some small subcentimeter pulmonary nodules that are scattered bilaterally which essentially are unchanged.  She does have an increase in size of the lymph nodes in the periaortic and AP window measuring 1 cm which is new.  On review of her op report lymph nodes from stations 5 and 6 were sampled, and were negative.  Close attention on follow-up will be performed.  There is also a multinodular thyroid which is similar to prior imaging.  I will see her back in 1 year with a repeat chest CT.  I  spent 10 minutes with  the patient face to face and greater then 50% of the time was spent in counseling and coordination of care.    Lajuana Matte 09/25/2019 11:15 AM

## 2019-11-24 ENCOUNTER — Other Ambulatory Visit: Payer: Self-pay | Admitting: Endocrinology

## 2019-11-24 DIAGNOSIS — Z1231 Encounter for screening mammogram for malignant neoplasm of breast: Secondary | ICD-10-CM

## 2020-08-08 ENCOUNTER — Other Ambulatory Visit: Payer: Self-pay | Admitting: Thoracic Surgery (Cardiothoracic Vascular Surgery)

## 2020-08-08 DIAGNOSIS — Z9889 Other specified postprocedural states: Secondary | ICD-10-CM

## 2020-08-08 DIAGNOSIS — C3490 Malignant neoplasm of unspecified part of unspecified bronchus or lung: Secondary | ICD-10-CM

## 2020-09-23 ENCOUNTER — Encounter: Payer: Self-pay | Admitting: Thoracic Surgery (Cardiothoracic Vascular Surgery)

## 2020-09-23 ENCOUNTER — Ambulatory Visit
Admission: RE | Admit: 2020-09-23 | Discharge: 2020-09-23 | Disposition: A | Discharge status: Home / Self Care | Payer: Medicare Other | Source: Ambulatory Visit | Attending: Thoracic Surgery (Cardiothoracic Vascular Surgery) | Admitting: Thoracic Surgery (Cardiothoracic Vascular Surgery)

## 2020-09-23 ENCOUNTER — Other Ambulatory Visit: Payer: Self-pay

## 2020-09-23 ENCOUNTER — Ambulatory Visit (INDEPENDENT_AMBULATORY_CARE_PROVIDER_SITE_OTHER): Payer: Medicare Other | Admitting: Thoracic Surgery (Cardiothoracic Vascular Surgery)

## 2020-09-23 VITALS — BP 180/75 | HR 86 | Resp 20 | Ht 69.0 in | Wt 160.0 lb

## 2020-09-23 DIAGNOSIS — C3490 Malignant neoplasm of unspecified part of unspecified bronchus or lung: Secondary | ICD-10-CM

## 2020-09-23 DIAGNOSIS — Z902 Acquired absence of lung [part of]: Secondary | ICD-10-CM

## 2020-09-23 DIAGNOSIS — Z85118 Personal history of other malignant neoplasm of bronchus and lung: Secondary | ICD-10-CM

## 2020-09-23 DIAGNOSIS — Z9889 Other specified postprocedural states: Secondary | ICD-10-CM

## 2020-09-23 NOTE — Progress Notes (Signed)
      [address]       [place] [address]             [phone]        Cinnamon Rogstad [place] Medical Record #[id number] Date of Birth: 1946-01-29  Referring: Beverley Fiedler, * Primary Care: Beverley Fiedler, FNP Primary Cardiologist:None  Reason for visit:   follow-up  History of Present Illness:     Jennifer [name] comes in for her 1 year follow-up appointment.  Overall she has done well.  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  She has had persistent AP window lymph nodes which were prominent on her last CT scan.  Physical Exam: BP (!) 180/75   Pulse 86   Resp 20   Ht 5\' 9"  (1.753 m)   Wt 160 lb (72.6 kg)   SpO2 98% Comment: RA  BMI 23.63 kg/m   Alert NAD Regular rate Easy work of breathing Abdomen ND No peripheral edema   Diagnostic Studies & Laboratory data: CT chest: Mediastinum/Nodes: Slightly increased size of prevascular lymph nodes. The largest measures measuring 1.1 cm in short axis on series 2 image 46., previously measured 1.0 cm. Additional reference pre-vascular lymph node measures 0.7 cm on image 33, previously 0.4 cm. Esophagus is unremarkable. Stable follow-up pulmonary nodules. Reference nodule in the left lower lobe measuring 4 mm on series 2 image 53. Linear opacities of the anterior right lower lobe, likely scarring or atelectasis.    Assessment / Plan:   [age]-year-old female who underwent a left video-assisted thoracoscopy with left upper lobectomy in October 2020 for stage Ib, T2 N0 M0 adenocarcinoma of the lung.  She has had prominence in her AP window lymph nodes.  These were biopsied at the time of surgery and were negative.  Last year the CT showed a lymph node that was 1 cm.  On this year scan of the lymph node measures 1.1 cm.  Her case will be discussed in tumor board to determine whether or not this node can be assessed via CT-guided biopsy.  It is close to the aorta,  thus reexploration may be required for this.  I will discuss tumor board plan with her next week.   Jennifer [name] 09/23/2020 4:12 PM

## 2020-09-30 ENCOUNTER — Telehealth (INDEPENDENT_AMBULATORY_CARE_PROVIDER_SITE_OTHER): Payer: Medicare Other | Admitting: Thoracic Surgery (Cardiothoracic Vascular Surgery)

## 2020-09-30 ENCOUNTER — Other Ambulatory Visit: Payer: Self-pay

## 2020-09-30 DIAGNOSIS — C3490 Malignant neoplasm of unspecified part of unspecified bronchus or lung: Secondary | ICD-10-CM | POA: Diagnosis not present

## 2020-09-30 NOTE — Progress Notes (Signed)
     [address]       [place] [address]             [phone]       Patient: Home Provider: Office Consent for Telemedicine visit obtained.  Today's visit was completed via a real-time telehealth (see specific modality noted below). The patient/authorized person provided oral consent at the time of the visit to engage in a telemedicine encounter with the present provider at [facility]. The patient/authorized person was informed of the potential benefits, limitations, and risks of telemedicine. The patient/authorized person expressed understanding that the laws that protect confidentiality also apply to telemedicine. The patient/authorized person acknowledged understanding that telemedicine does not provide emergency services and that he or she would need to call 911 or proceed to the nearest hospital for help if such a need arose.   Total time spent in the clinical discussion 10 minutes.  Telehealth Modality: Phone visit (audio only)  I had a telephone visit with Jennifer [name].  She is status post a left upper lobectomy for stage I lung cancer who is being followed for a 1 cm prevascular lymph node.  Her case was discussed in tumor board and the consensus was that this lymph node is essentially unchanged.  Due to abnormal lymphatic drainage likely that this is just an enlarged lymph node.  I have informed [name] that I would like to perform a PET/CT to ensure that there is no avidity.  Once that has been performed I will touch base with her again.  [name] Bary Leriche

## 2020-10-03 ENCOUNTER — Other Ambulatory Visit: Payer: Self-pay | Admitting: *Deleted

## 2020-10-03 DIAGNOSIS — C3490 Malignant neoplasm of unspecified part of unspecified bronchus or lung: Secondary | ICD-10-CM

## 2020-10-03 DIAGNOSIS — Z902 Acquired absence of lung [part of]: Secondary | ICD-10-CM

## 2020-10-05 ENCOUNTER — Other Ambulatory Visit: Payer: Self-pay | Admitting: *Deleted

## 2020-10-14 ENCOUNTER — Ambulatory Visit (HOSPITAL_COMMUNITY)
Admission: RE | Admit: 2020-10-14 | Discharge: 2020-10-14 | Disposition: A | Discharge status: Home / Self Care | Payer: Medicare Other | Source: Ambulatory Visit | Attending: Thoracic Surgery (Cardiothoracic Vascular Surgery) | Admitting: Thoracic Surgery (Cardiothoracic Vascular Surgery)

## 2020-10-14 ENCOUNTER — Telehealth (INDEPENDENT_AMBULATORY_CARE_PROVIDER_SITE_OTHER): Payer: Medicare Other | Admitting: Thoracic Surgery (Cardiothoracic Vascular Surgery)

## 2020-10-14 ENCOUNTER — Encounter: Payer: Self-pay | Admitting: *Deleted

## 2020-10-14 ENCOUNTER — Other Ambulatory Visit: Payer: Self-pay

## 2020-10-14 ENCOUNTER — Telehealth: Payer: Self-pay | Admitting: Internal Medicine

## 2020-10-14 DIAGNOSIS — C771 Secondary and unspecified malignant neoplasm of intrathoracic lymph nodes: Secondary | ICD-10-CM | POA: Diagnosis not present

## 2020-10-14 DIAGNOSIS — Z79899 Other long term (current) drug therapy: Secondary | ICD-10-CM | POA: Diagnosis not present

## 2020-10-14 DIAGNOSIS — C3490 Malignant neoplasm of unspecified part of unspecified bronchus or lung: Secondary | ICD-10-CM

## 2020-10-14 DIAGNOSIS — J439 Emphysema, unspecified: Secondary | ICD-10-CM | POA: Diagnosis not present

## 2020-10-14 DIAGNOSIS — Z902 Acquired absence of lung [part of]: Secondary | ICD-10-CM

## 2020-10-14 DIAGNOSIS — I700 Atherosclerosis of aorta: Secondary | ICD-10-CM | POA: Diagnosis not present

## 2020-10-14 DIAGNOSIS — R911 Solitary pulmonary nodule: Secondary | ICD-10-CM

## 2020-10-14 LAB — GLUCOSE, CAPILLARY: Glucose-Capillary: 107 mg/dL — ABNORMAL HIGH (ref 70–99)

## 2020-10-14 MED ORDER — FLUDEOXYGLUCOSE F - 18 (FDG) INJECTION
8.3000 | Freq: Once | INTRAVENOUS | Status: AC
  Administered 2020-10-14: 7.95 via INTRAVENOUS

## 2020-10-14 NOTE — Telephone Encounter (Signed)
Received a new pt referral from Dr. Kipp Brood for lung cancer. Jennifer [name] has been cld and scheduled to see Dr. [name] on 8/31 at 2:15pm w/labs at 1:45pm. Pt aware to arrive 15 minutes early.

## 2020-10-14 NOTE — Progress Notes (Signed)
     [address]       [place] [address]             [phone]       Patient: Home Provider: Office Consent for Telemedicine visit obtained.  Today's visit was completed via a real-time telehealth (see specific modality noted below). The patient/authorized person provided oral consent at the time of the visit to engage in a telemedicine encounter with the present provider at [facility]. The patient/authorized person was informed of the potential benefits, limitations, and risks of telemedicine. The patient/authorized person expressed understanding that the laws that protect confidentiality also apply to telemedicine. The patient/authorized person acknowledged understanding that telemedicine does not provide emergency services and that he or she would need to call 911 or proceed to the nearest hospital for help if such a need arose.   Total time spent in the clinical discussion 10 minutes.  Telehealth Modality: Phone visit (audio only)  I had a telephone visit with Mrs. Ogando.  She is status post left upper lobectomy for stage I lung cancer in 2020.  I have been following her for surveillance.  She has had mild enlargement and prevascular lymph node.  She recently underwent a PET/CT which on my read shows some avidity.  Due to concern for recurrence, I have referred her to Dr. [name], and also placed a referral for interventional radiology for potential tip tissue sample.  Her case will also be reviewed and discussed in our tumor board again.  Amond Speranza Bary Leriche

## 2020-10-14 NOTE — Progress Notes (Signed)
I received referral from Dr. Kipp Brood today. I updated new patient coordinator to call and schedule her to be seen on 10/25/20 with labs.

## 2020-10-18 ENCOUNTER — Other Ambulatory Visit: Payer: Self-pay | Admitting: Thoracic Surgery (Cardiothoracic Vascular Surgery)

## 2020-10-18 ENCOUNTER — Encounter (HOSPITAL_COMMUNITY): Payer: Self-pay | Admitting: Radiology

## 2020-10-18 ENCOUNTER — Other Ambulatory Visit (HOSPITAL_COMMUNITY): Payer: Self-pay | Admitting: Thoracic Surgery (Cardiothoracic Vascular Surgery)

## 2020-10-18 DIAGNOSIS — C3490 Malignant neoplasm of unspecified part of unspecified bronchus or lung: Secondary | ICD-10-CM

## 2020-10-18 NOTE — Progress Notes (Signed)
Patient Demographics  Patient Name  Jennifer, [name] Legal Sex  Female DOB  [birth year] SSN  [id number] Address  [place] [address] Phone  [phone] [facility])  [phone] (Mobile)    RE: [place] CORE BIOPSY (LYMPH NODES) Received: Today Criselda Peaches, MD  Garth Bigness D; P Ir Procedure Requests Mediastinal nodes are not amenable for biopsy.   HKM        Previous Messages   ----- Message -----  From: Garth Bigness D  Sent: 10/18/2020  12:20 PM EDT  To: Ir Procedure Requests  Subject: [place] CORE BIOPSY (LYMPH NODES)                   Procedure:  [place] CORE BIOPSY (LYMPH NODES)   Reason:  Malignant neoplasm of unspecified part of unspecified bronchus or lung, lymph node   History:  CT, NM in computer   Provider:  Lajuana Matte   Provider Contact:  [phone]

## 2020-10-20 ENCOUNTER — Other Ambulatory Visit: Payer: Self-pay | Admitting: *Deleted

## 2020-10-20 NOTE — Progress Notes (Signed)
The proposed treatment discussed in cancer conference is for discussion purpose only and is not a binding recommendation. The patient was not physically examined nor present for their treatment options. Therefore, final treatment plans cannot be decided.  ?

## 2020-10-26 ENCOUNTER — Other Ambulatory Visit: Payer: Self-pay

## 2020-10-26 ENCOUNTER — Encounter: Payer: Self-pay | Admitting: Internal Medicine

## 2020-10-26 ENCOUNTER — Inpatient Hospital Stay: Payer: Medicare Other | Attending: Internal Medicine | Admitting: Internal Medicine

## 2020-10-26 ENCOUNTER — Encounter: Payer: Self-pay | Admitting: *Deleted

## 2020-10-26 ENCOUNTER — Inpatient Hospital Stay: Payer: Medicare Other

## 2020-10-26 VITALS — BP 139/81 | HR 93 | Temp 97.8°F | Resp 19 | Ht 69.0 in | Wt 162.6 lb

## 2020-10-26 DIAGNOSIS — C3492 Malignant neoplasm of unspecified part of left bronchus or lung: Secondary | ICD-10-CM | POA: Insufficient documentation

## 2020-10-26 DIAGNOSIS — Z79899 Other long term (current) drug therapy: Secondary | ICD-10-CM | POA: Diagnosis not present

## 2020-10-26 DIAGNOSIS — C3411 Malignant neoplasm of upper lobe, right bronchus or lung: Secondary | ICD-10-CM | POA: Diagnosis present

## 2020-10-26 DIAGNOSIS — I10 Essential (primary) hypertension: Secondary | ICD-10-CM | POA: Diagnosis not present

## 2020-10-26 DIAGNOSIS — E785 Hyperlipidemia, unspecified: Secondary | ICD-10-CM | POA: Insufficient documentation

## 2020-10-26 DIAGNOSIS — R911 Solitary pulmonary nodule: Secondary | ICD-10-CM

## 2020-10-26 LAB — CBC WITH DIFFERENTIAL (CANCER CENTER ONLY)
Abs Immature Granulocytes: 0.02 10*3/uL (ref 0.00–0.07)
Basophils Absolute: 0 10*3/uL (ref 0.0–0.1)
Basophils Relative: 0 %
Eosinophils Absolute: 0.1 10*3/uL (ref 0.0–0.5)
Eosinophils Relative: 1 %
HCT: 36.2 % (ref 36.0–46.0)
Hemoglobin: 12.5 g/dL (ref 12.0–15.0)
Immature Granulocytes: 0 %
Lymphocytes Relative: 25 %
Lymphs Abs: 2.2 10*3/uL (ref 0.7–4.0)
MCH: 31.4 pg (ref 26.0–34.0)
MCHC: 34.5 g/dL (ref 30.0–36.0)
MCV: 91 fL (ref 80.0–100.0)
Monocytes Absolute: 0.6 10*3/uL (ref 0.1–1.0)
Monocytes Relative: 7 %
Neutro Abs: 5.8 10*3/uL (ref 1.7–7.7)
Neutrophils Relative %: 67 %
Platelet Count: 224 10*3/uL (ref 150–400)
RBC: 3.98 MIL/uL (ref 3.87–5.11)
RDW: 13.6 % (ref 11.5–15.5)
WBC Count: 8.7 10*3/uL (ref 4.0–10.5)
nRBC: 0 % (ref 0.0–0.2)

## 2020-10-26 LAB — CMP (CANCER CENTER ONLY)
ALT: 25 U/L (ref 0–44)
AST: 23 U/L (ref 15–41)
Albumin: 4 g/dL (ref 3.5–5.0)
Alkaline Phosphatase: 95 U/L (ref 38–126)
Anion gap: 10 (ref 5–15)
BUN: 20 mg/dL (ref 8–23)
CO2: 29 mmol/L (ref 22–32)
Calcium: 9.7 mg/dL (ref 8.9–10.3)
Chloride: 103 mmol/L (ref 98–111)
Creatinine: 1.42 mg/dL — ABNORMAL HIGH (ref 0.44–1.00)
GFR, Estimated: 39 mL/min — ABNORMAL LOW (ref >60)
Glucose, Bld: 135 mg/dL — ABNORMAL HIGH (ref 70–99)
Potassium: 4.4 mmol/L (ref 3.5–5.1)
Sodium: 142 mmol/L (ref 135–145)
Total Bilirubin: 0.7 mg/dL (ref 0.3–1.2)
Total Protein: 7.4 g/dL (ref 6.5–8.1)

## 2020-10-26 NOTE — Progress Notes (Signed)
[place] [phone]   Fax:([phone]) [phone]  CONSULT NOTE  REFERRING PHYSICIAN: Dr. Melodie Bouillon  REASON FOR CONSULTATION:  75 years old white female with suspicious recurrent lung cancer  HPI Jennifer [name] is a 75 y.o. female with past medical history significant for hypertension, dyslipidemia, prediabetes, vitamin D deficiency as well as history of pneumonia.  The patient mentioned that she was seen by her primary care physician for routine evaluation and chest x-ray was done because of her smoking history and it showed a suspicious lesion in the left lung.  This was followed by CT scan of the chest on November 11, 2018 and it showed 2.4 cm left upper lobe lung nodule suspicious for primary bronchogenic carcinoma.  The patient had a PET scan on November 27, 2018 and it showed hypermetabolic left upper lobe pulmonary nodule consistent with primary bronchogenic carcinoma.  There was no hypermetabolic mediastinal lymphadenopathy or distant metastatic disease.  The patient was referred to Dr. Kipp Brood and she had a bronchoscopy on December 09, 2018 that showed malignant cells favoring adenocarcinoma.  On December 29, 2018, the patient underwent left upper lobectomy with lymph node dissection under the care of Dr. Kipp Brood.  The final pathology ([id number]) showed invasive adenocarcinoma moderately differentiated measuring 3.2 cm with negative resection margin and the dissected lymph nodes were negative for malignancy. Her postoperative course was complicated with air leak and the patient had to have valve placement for control of the air leak.  She was followed by observation under the care of Dr. Kipp Brood and repeat imaging studies.  Repeat CT scan of the chest on September 25, 2019 showed mildly enlarged mediastinal lymph nodes that were nonspecific suspicious to be reactive in nature.  Another CT scan of the chest was performed on September 23, 2020 and that showed slight  increase size of the prevascular lymph nodes.  The largest measured 1.1 cm in short axis.  Additional prevascular lymph nodes measured 0.7.  The patient had a PET scan performed on 10/14/2020 and it showed hypermetabolic lymph nodes in the anterior mediastinum compatible with disease recurrence.  There was no additional signs of disease in the neck, chest, abdomen or pelvis. She was referred to me today for evaluation and recommendation regarding her condition. When seen today the patient has no complaints except for anxiety about her recent findings.  She denied having any chest pain, shortness of breath, cough or hemoptysis.  She denied having any weight loss or night sweats.  She has no nausea, vomiting, diarrhea or constipation.  She denied having any headache or visual changes. Family history significant for mother died from heart disease at age 19 and father died from urosepsis. The patient is a widow and has 3 children.  She was accompanied today by her daughter Jennifer [name].  The patient used to work as a Statistician.  She has a history of smoking 1 pack/day for around 50 years and quit 2 years ago.  She also drinks a glass of wine every day.  She has no history of drug abuse. HPI  Past Medical History:  Diagnosis Date   History of chicken pox    Hyperlipidemia    Hypertension    Menopause    Osteoporosis    Pneumonia    "walking" pneumonia   Pre-diabetes    Vitamin D deficiency     Past Surgical History:  Procedure Laterality Date   CHEST TUBE INSERTION Left 01/19/2019   Procedure: Chest Tube Insertion;  Surgeon: Lajuana Matte, MD;  Location: [place] [facility];  Service: Thoracic;  Laterality: Left;   PLEURADESIS N/A 01/06/2019   Procedure: Pleuradesis - Chemical;  Surgeon: Lajuana Matte, MD;  Location: [place];  Service: Thoracic;  Laterality: N/A;   PLEURADESIS Left 01/19/2019   Procedure: Mechanical Pleuradesis;  Surgeon: Lajuana Matte, MD;  Location: [place];  Service:  Thoracic;  Laterality: Left;   VIDEO ASSISTED THORACOSCOPY Left 01/19/2019   Procedure: VIDEO ASSISTED THORACOSCOPY;  Surgeon: Lajuana Matte, MD;  Location: [place];  Service: Thoracic;  Laterality: Left;   VIDEO ASSISTED THORACOSCOPY (VATS)/ LOBECTOMY Left 12/29/2018   Procedure: VIDEO ASSISTED THORACOSCOPY (VATS)/LEFT UPPER  LOBECTOMY with Mediastinal lymph node exploration.;  Surgeon: Lajuana Matte, MD;  Location: [place];  Service: Thoracic;  Laterality: Left;   VIDEO BRONCHOSCOPY N/A 12/29/2018   Procedure: VIDEO BRONCHOSCOPY;  Surgeon: Lajuana Matte, MD;  Location: [place];  Service: Thoracic;  Laterality: N/A;   [place] N/A 12/09/2018   Procedure: VIDEO BRONCHOSCOPY WITH ENDOBRONCHIAL NAVIGATION;  Surgeon: Lajuana Matte, MD;  Location: [place];  Service: Thoracic;  Laterality: N/A;   VIDEO BRONCHOSCOPY WITH INSERTION OF INTERBRONCHIAL VALVE (IBV) N/A 01/06/2019   Procedure: VIDEO BRONCHOSCOPY WITH INSERTION OF INTERBRONCHIAL VALVE (IBV);  Surgeon: Lajuana Matte, MD;  Location: [place];  Service: Thoracic;  Laterality: N/A;   VIDEO BRONCHOSCOPY WITH INSERTION OF INTERBRONCHIAL VALVE (IBV) N/A 01/13/2019   Procedure: VIDEO BRONCHOSCOPY WITH INSERTION OF INTERBRONCHIAL VALVE (IBV);  Surgeon: Lajuana Matte, MD;  [facility];  Service: Thoracic;  Laterality: N/A;   VIDEO BRONCHOSCOPY WITH INSERTION OF INTERBRONCHIAL VALVE (IBV) N/A 02/23/2019   Procedure: VIDEO BRONCHOSCOPY WITH REMOVAL OF INTERBRONCHIAL VALVE (IBV);  Surgeon: Lajuana Matte, MD;  Location: [facility];  Service: Thoracic;  Laterality: N/A;    No family history on file.  Social History Social History   Tobacco Use   Smoking status: Former    Packs/day: 0.50    Types: Cigarettes   Smokeless tobacco: Never   Tobacco comments:    has not had a cigarette since 12/04/18  Vaping Use   Vaping Use: Never used  Substance Use Topics   Alcohol use: Yes     Alcohol/week: 1.0 standard drink    Types: 1 Glasses of wine per week    Comment: daily   Drug use: No    Allergies  Allergen Reactions   Aspirin Hives   Latex Rash    Current Outpatient Medications  Medication Sig Dispense Refill   atorvastatin (LIPITOR) 40 MG tablet Take 40 mg by mouth every evening.      Calcium Carb-Cholecalciferol (CALCIUM 600 + D PO) Take 1 tablet by mouth daily.     diltiazem (DILACOR XR) 240 MG 24 hr capsule Take 240 mg by mouth daily.     guaiFENesin (MUCINEX) 600 MG 12 hr tablet Take 1 tablet (600 mg total) by mouth 2 (two) times daily. (Patient taking differently: Take 600 mg by mouth daily.)     metoprolol succinate (TOPROL-XL) 25 MG 24 hr tablet Take 25 mg by mouth daily.     triamterene-hydrochlorothiazide (MAXZIDE-25) 37.5-25 MG tablet Take 1 tablet by mouth daily.     No current facility-administered medications for this visit.    Review of Systems  Constitutional: negative Eyes: negative Ears, nose, mouth, throat, and face: negative Respiratory: negative Cardiovascular: negative Gastrointestinal: negative Genitourinary:negative Integument/breast: negative Hematologic/lymphatic: negative Musculoskeletal:negative Neurological: negative Behavioral/Psych: negative Endocrine: negative  Allergic/Immunologic: negative  Physical Exam  FGH:WEXHB, healthy, no distress, well nourished, well developed, and anxious SKIN: skin color, texture, turgor are normal, no rashes or significant lesions HEAD: Normocephalic, No masses, lesions, tenderness or abnormalities EYES: normal, PERRLA, Conjunctiva are pink and non-injected EARS: External ears normal, Canals clear OROPHARYNX:no exudate, no erythema, and lips, buccal mucosa, and tongue normal  NECK: supple, no adenopathy, no JVD LYMPH:  no palpable lymphadenopathy, no hepatosplenomegaly BREAST:not examined LUNGS: clear to auscultation , and palpation HEART: regular rate & rhythm, no murmurs, and  no gallops ABDOMEN:abdomen soft, non-tender, normal bowel sounds, and no masses or organomegaly BACK: Back symmetric, no curvature., No CVA tenderness EXTREMITIES:no joint deformities, effusion, or inflammation, no edema  NEURO: alert & oriented x 3 with fluent speech, no focal motor/sensory deficits  PERFORMANCE STATUS: ECOG 1  LABORATORY DATA: Lab Results  Component Value Date   WBC 8.7 10/26/2020   HGB 12.5 10/26/2020   HCT 36.2 10/26/2020   MCV 91.0 10/26/2020   PLT 224 10/26/2020      Chemistry      Component Value Date/Time   NA 140 02/18/2019 0932   K 4.1 02/18/2019 0932   CL 105 02/18/2019 0932   CO2 24 02/18/2019 0932   BUN 9 02/18/2019 0932   CREATININE 1.01 (H) 02/18/2019 0932   GLU 845 10/28/2009 0000      Component Value Date/Time   CALCIUM 9.2 02/18/2019 0932   ALKPHOS 122 02/18/2019 0932   AST 15 02/18/2019 0932   ALT 13 02/18/2019 0932   BILITOT 0.6 02/18/2019 0932       RADIOGRAPHIC STUDIES: NM PET Image Restag (PS) Skull Base To Thigh  Result Date: 10/15/2020 CLINICAL DATA:  Subsequent treatment strategy for non-small cell lung cancer with increasing size of lymph nodes on recent CT evaluation in a 75 year old female. EXAM: NUCLEAR MEDICINE PET SKULL BASE TO THIGH TECHNIQUE: 7.95 mCi F-18 FDG was injected intravenously. Full-ring PET imaging was performed from the skull base to thigh after the radiotracer. CT data was obtained and used for attenuation correction and anatomic localization. Fasting blood glucose: 107 mg/dl COMPARISON:  Comparison with September 23, 2020 chest CT and prior PET exam from October of 2020. FINDINGS: Mediastinal blood pool activity: SUV max 2.65 Liver activity: SUV max NA NECK: Asymmetric uptake in the RIGHT neck related to submandibular gland on the RIGHT. No nodal disease in the neck. Enlarged and heterogeneous thyroid is unchanged compared to prior imaging. Incidental CT findings: none CHEST: Lymph nodes of concern in the anterior  mediastinum show a maximum SUV of 6.7 largest on image 67 of series 4 approximately 1 cm short axis. No additional signs of FDG avid disease in the chest. Incidental CT findings: Post LEFT upper lobectomy. Heart size stable. Aortic atherosclerosis without aneurysm. No consolidation or effusion. No change in the appearance of the chest compared to the very recent CT of the chest. ABDOMEN/PELVIS: No abnormal hypermetabolic activity within the liver, pancreas, adrenal glands, or spleen. No hypermetabolic lymph nodes in the abdomen or pelvis. Incidental CT findings: No acute findings relative to liver, gallbladder, pancreas, spleen, adrenal glands or kidneys. Stable benign-appearing thickening of the LEFT adrenal gland. No hypermetabolic changes in this area. No perivesical stranding. No hydronephrosis. No acute gastrointestinal findings. Colonic diverticulosis. Normal appendix. Uterus unremarkable. SKELETON: No focal hypermetabolic activity to suggest skeletal metastasis. Incidental CT findings: Spinal degenerative changes. IMPRESSION: Hypermetabolic lymph nodes in the anterior mediastinum compatible with disease recurrence. No additional signs of disease in  the neck, chest, abdomen or pelvis. Asymmetric uptake in the RIGHT neck related to the RIGHT submandibular gland. No glandular tissue on the LEFT. No change in the appearance of this area by CT since October of 2020. Aortic Atherosclerosis (ICD10-I70.0) and Emphysema (ICD10-J43.9). Electronically Signed   By: Zetta Bills M.D.   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 Recent imaging studies showed suspicious anterior  mediastinal lymphadenopathy concerning for disease recurrence.  PLAN: I had a lengthy discussion with the patient and her daughter today about her current disease stage, prognosis and treatment options. I personally and independently reviewed the scan images and discussed the result and showed the images to the patient and her daughter. I recommended for the patient to have ultrasound-guided core biopsy of the suspicious anterior mediastinal mass by interventional radiology. If the final pathology is consistent with recurrent non-small cell lung cancer, the patient may benefit from either surgical resection or curative SBRT I will also send her previous tissue biopsy to be tested for molecular studies and PD-L1 expression. I will see the patient back for follow-up visit in around 2 weeks for evaluation and more detailed discussion of her treatment options based on the final pathology. The patient was advised to call immediately if she has any other concerning symptoms in the interval. The patient voices understanding of current disease status and treatment options and is in agreement with the current care plan.  All questions were answered. The patient knows to call the clinic with any problems, questions or concerns. We can certainly see the patient much sooner if necessary.  Thank you so much for allowing me to participate in the care of Jennifer [name]. I will continue to follow up the patient with you and assist in her care.  The total time spent in the appointment was 60 minutes.  Disclaimer: This note was dictated with voice recognition software. Similar sounding words can inadvertently be transcribed and may not be corrected upon review.   Jennifer [name] October 26, 2020, 2:15 PM

## 2020-10-26 NOTE — Progress Notes (Signed)
Per Dr. [name], I notified pathology dept to send tissue obtained on 12/29/2018 for [facility] for molecular and PDL 1 testing.

## 2020-10-26 NOTE — Progress Notes (Signed)
I spoke with Ms. Jennifer [name] and her daughter today at clinic.  She is a new patient of Dr. Worthy Flank with past hx of lung cancer.  Her plan of care is to get a bx then to have her back for discussion.  I gave information on lung cancer and resources here at the cancer center.  I explained next steps for follow up.

## 2020-10-27 ENCOUNTER — Encounter (HOSPITAL_COMMUNITY): Payer: Self-pay | Admitting: Radiology

## 2020-10-27 NOTE — Progress Notes (Signed)
Patient Name  Jennifer [name], Jennifer [name] Legal Sex  Female DOB  09-01-1945 SSN  [id number] Address  [place] [address] Phone  [phone] [facility])  [phone] (Mobile)   FW: question Received: Today Arne Cleveland, MD  Garth Bigness D Cc: Curt Bears, MD; Valrie Hart, RN Please schedule:   CT core biopsy anterior medistinal LAN  As previously ordered, and Discussed in cancer conf  You can sched  on my day   Thx  DDH        Previous Messages   ----- Message -----  From: Valrie Hart, RN  Sent: 10/27/2020   3:29 PM EDT  To: Arne Cleveland, MD, Curt Bears, MD  Subject: question                                       Hi Dr. Vernard Gambles-  Dr. [name] and myself got a message from radiology scheduling that Dr. Laurence Ferrari that he has denied her bx.  Can you update [place] on the best way to get a bx?  Thanks Hinton Dyer

## 2020-11-01 ENCOUNTER — Telehealth: Payer: Self-pay | Admitting: *Deleted

## 2020-11-01 NOTE — Telephone Encounter (Signed)
I followed up on Jennifer [name]'s schedule. She is set up for her bx and follow up appt here at the cancer center. I called Jennifer [name] to check to see if she understood her plan of care. She verbalized she was confused about when to show up for her bx. I clarified.  She was thankful for the call.  I asked that she call me if needed.

## 2020-11-07 ENCOUNTER — Encounter (HOSPITAL_COMMUNITY): Payer: Self-pay | Admitting: Internal Medicine

## 2020-11-08 ENCOUNTER — Other Ambulatory Visit (HOSPITAL_COMMUNITY): Payer: Self-pay | Admitting: Physician Assistant

## 2020-11-08 ENCOUNTER — Encounter (HOSPITAL_COMMUNITY): Payer: Self-pay

## 2020-11-08 ENCOUNTER — Other Ambulatory Visit: Payer: Self-pay | Admitting: Student

## 2020-11-08 ENCOUNTER — Other Ambulatory Visit: Payer: Self-pay | Admitting: Radiology

## 2020-11-08 NOTE — H&P (Signed)
Chief Complaint: Lymphadenopathy  Referring Physician(s): Mohamed  Supervising Physician: Arne Cleveland  Patient Status: [facility] - Out-pt  History of Present Illness: Jennifer [name] is a 75 y.o. female with non-small cell lung cancer with increasing size of lymph nodes on recent CT.  Original diagnosis was last year. In September she underwent left upper lobectomy with lymph node dissection with tumor size of 3.2 cm and negative lymphadenopathy at that time.  PET done 10/15/2020 showed = Hypermetabolic lymph nodes in the anterior mediastinum compatible with disease recurrence.  Her case was discussed at tumor board.  She is here today for biopsy of the anterior mediastinal lymph node.  She is NPO. No nausea/vomiting. No Fever/chills. ROS negative.   Past Medical History:  Diagnosis Date   History of chicken pox    Hyperlipidemia    Hypertension    Menopause    Osteoporosis    Pneumonia    "walking" pneumonia   Pre-diabetes    Vitamin D deficiency     Past Surgical History:  Procedure Laterality Date   CHEST TUBE INSERTION Left 01/19/2019   Procedure: Chest Tube Insertion;  Surgeon: Lajuana Matte, MD;  Location: [place] [facility];  Service: Thoracic;  Laterality: Left;   PLEURADESIS N/A 01/06/2019   Procedure: Pleuradesis - Chemical;  Surgeon: Lajuana Matte, MD;  Location: [place] [facility];  Service: Thoracic;  Laterality: N/A;   PLEURADESIS Left 01/19/2019   Procedure: Mechanical Pleuradesis;  Surgeon: Lajuana Matte, MD;  Location: [place];  Service: Thoracic;  Laterality: Left;   VIDEO ASSISTED THORACOSCOPY Left 01/19/2019   Procedure: VIDEO ASSISTED THORACOSCOPY;  Surgeon: Lajuana Matte, MD;  Location: [place];  Service: Thoracic;  Laterality: Left;   VIDEO ASSISTED THORACOSCOPY (VATS)/ LOBECTOMY Left 12/29/2018   Procedure: VIDEO ASSISTED THORACOSCOPY (VATS)/LEFT UPPER  LOBECTOMY with Mediastinal lymph node exploration.;  Surgeon: Lajuana Matte, MD;   Location: [place];  Service: Thoracic;  Laterality: Left;   VIDEO BRONCHOSCOPY N/A 12/29/2018   Procedure: VIDEO BRONCHOSCOPY;  Surgeon: Lajuana Matte, MD;  Location: [facility];  Service: Thoracic;  Laterality: N/A;   [place] N/A 12/09/2018   Procedure: VIDEO BRONCHOSCOPY WITH ENDOBRONCHIAL NAVIGATION;  Surgeon: Lajuana Matte, MD;  Location: [place];  Service: Thoracic;  Laterality: N/A;   VIDEO BRONCHOSCOPY WITH INSERTION OF INTERBRONCHIAL VALVE (IBV) N/A 01/06/2019   Procedure: VIDEO BRONCHOSCOPY WITH INSERTION OF INTERBRONCHIAL VALVE (IBV);  Surgeon: Lajuana Matte, MD;  Location: [place];  Service: Thoracic;  Laterality: N/A;   VIDEO BRONCHOSCOPY WITH INSERTION OF INTERBRONCHIAL VALVE (IBV) N/A 01/13/2019   Procedure: VIDEO BRONCHOSCOPY WITH INSERTION OF INTERBRONCHIAL VALVE (IBV);  Surgeon: Lajuana Matte, MD;  [facility];  Service: Thoracic;  Laterality: N/A;   VIDEO BRONCHOSCOPY WITH INSERTION OF INTERBRONCHIAL VALVE (IBV) N/A 02/23/2019   Procedure: VIDEO BRONCHOSCOPY WITH REMOVAL OF INTERBRONCHIAL VALVE (IBV);  Surgeon: Lajuana Matte, MD;  Location: [facility] OR;  Service: Thoracic;  Laterality: N/A;    Allergies: Aspirin and Latex  Medications: Prior to Admission medications   Medication Sig Start Date End Date Taking? Authorizing Provider  atorvastatin (LIPITOR) 40 MG tablet Take 40 mg by mouth every evening.  10/17/18  Yes [provider]  diltiazem (DILACOR XR) 240 MG 24 hr capsule Take 240 mg by mouth daily. 12/31/19  Yes [provider]  guaiFENesin (MUCINEX) 600 MG 12 hr tablet Take 1 tablet (600 mg total) by mouth 2 (two) times daily. Patient taking differently:  Take 600 mg by mouth daily. 01/21/19  Yes Conte, Tessa N, PA-C  metoprolol succinate (TOPROL-XL) 25 MG 24 hr tablet Take 25 mg by mouth daily.   Yes [provider]  Multiple Vitamin (MULTIVITAMIN WITH MINERALS) TABS tablet Take 1  tablet by mouth daily.   Yes [provider]  triamterene-hydrochlorothiazide (MAXZIDE-25) 37.5-25 MG tablet Take 1 tablet by mouth daily. 09/12/20  Yes [provider]     History reviewed. No pertinent family history.  Social History   Socioeconomic History   Marital status: Widowed    Spouse name: Not on file   Number of children: Not on file   Years of education: Not on file   Highest education level: Not on file  Occupational History   Not on file  Tobacco Use   Smoking status: Former    Packs/day: 0.50    Types: Cigarettes   Smokeless tobacco: Never   Tobacco comments:    has not had a cigarette since 12/04/18  Vaping Use   Vaping Use: Never used  Substance and Sexual Activity   Alcohol use: Yes    Alcohol/week: 1.0 standard drink    Types: 1 Glasses of wine per week    Comment: daily   Drug use: No   Sexual activity: Not Currently  Other Topics Concern   Not on file  Social History Narrative   Not on file   Social Determinants of Health   Financial Resource Strain: Not on file  Food Insecurity: Not on file  Transportation Needs: Not on file  Physical Activity: Not on file  Stress: Not on file  Social Connections: Not on file     Review of Systems: A 12 point ROS discussed and pertinent positives are indicated in the HPI above.  All other systems are negative.  Review of Systems  Vital Signs: BP (!) 152/63   Pulse 77   Temp 97.8 F (36.6 C) (Oral)   Resp 18   Ht 5\' 9"  (1.753 m)   Wt 68 kg   SpO2 100%   BMI 22.15 kg/m   Physical Exam Vitals reviewed.  Constitutional:      Appearance: Normal appearance.  HENT:     Head: Normocephalic and atraumatic.  Eyes:     Extraocular Movements: Extraocular movements intact.  Cardiovascular:     Rate and Rhythm: Normal rate and regular rhythm.  Pulmonary:     Effort: Pulmonary effort is normal. No respiratory distress.     Breath sounds: Normal breath sounds.  Abdominal:      Palpations: Abdomen is soft.  Musculoskeletal:        General: Normal range of motion.     Cervical back: Normal range of motion.  Skin:    General: Skin is warm and dry.  Neurological:     General: No focal deficit present.     Mental Status: She is alert and oriented to person, place, and time.  Psychiatric:        Mood and Affect: Mood normal.        Behavior: Behavior normal.        Thought Content: Thought content normal.        Judgment: Judgment normal.    Imaging: NM PET Image Restag (PS) Skull Base To Thigh  Result Date: 10/15/2020 CLINICAL DATA:  Subsequent treatment strategy for non-small cell lung cancer with increasing size of lymph nodes on recent CT evaluation in a 75 year old female. EXAM: NUCLEAR MEDICINE PET SKULL BASE  TO THIGH TECHNIQUE: 7.95 mCi F-18 FDG was injected intravenously. Full-ring PET imaging was performed from the skull base to thigh after the radiotracer. CT data was obtained and used for attenuation correction and anatomic localization. Fasting blood glucose: 107 mg/dl COMPARISON:  Comparison with September 23, 2020 chest CT and prior PET exam from October of 2020. FINDINGS: Mediastinal blood pool activity: SUV max 2.65 Liver activity: SUV max NA NECK: Asymmetric uptake in the RIGHT neck related to submandibular gland on the RIGHT. No nodal disease in the neck. Enlarged and heterogeneous thyroid is unchanged compared to prior imaging. Incidental CT findings: none CHEST: Lymph nodes of concern in the anterior mediastinum show a maximum SUV of 6.7 largest on image 67 of series 4 approximately 1 cm short axis. No additional signs of FDG avid disease in the chest. Incidental CT findings: Post LEFT upper lobectomy. Heart size stable. Aortic atherosclerosis without aneurysm. No consolidation or effusion. No change in the appearance of the chest compared to the very recent CT of the chest. ABDOMEN/PELVIS: No abnormal hypermetabolic activity within the liver, pancreas, adrenal  glands, or spleen. No hypermetabolic lymph nodes in the abdomen or pelvis. Incidental CT findings: No acute findings relative to liver, gallbladder, pancreas, spleen, adrenal glands or kidneys. Stable benign-appearing thickening of the LEFT adrenal gland. No hypermetabolic changes in this area. No perivesical stranding. No hydronephrosis. No acute gastrointestinal findings. Colonic diverticulosis. Normal appendix. Uterus unremarkable. SKELETON: No focal hypermetabolic activity to suggest skeletal metastasis. Incidental CT findings: Spinal degenerative changes. IMPRESSION: Hypermetabolic lymph nodes in the anterior mediastinum compatible with disease recurrence. No additional signs of disease in the neck, chest, abdomen or pelvis. Asymmetric uptake in the RIGHT neck related to the RIGHT submandibular gland. No glandular tissue on the LEFT. No change in the appearance of this area by CT since October of 2020. Aortic Atherosclerosis (ICD10-I70.0) and Emphysema (ICD10-J43.9). Electronically Signed   By: Zetta Bills M.D.   On: 10/15/2020 16:57    Labs:  CBC: Recent Labs    10/26/20 1356  WBC 8.7  HGB 12.5  HCT 36.2  PLT 224    COAGS: No results for input(s): INR, APTT in the last 8760 hours.  BMP: Recent Labs    10/26/20 1356  NA 142  K 4.4  CL 103  CO2 29  GLUCOSE 135*  BUN 20  CALCIUM 9.7  CREATININE 1.42*  GFRNONAA 39*    LIVER FUNCTION TESTS: Recent Labs    10/26/20 1356  BILITOT 0.7  AST 23  ALT 25  ALKPHOS 95  PROT 7.4  ALBUMIN 4.0    TUMOR MARKERS: No results for input(s): AFPTM, CEA, CA199, CHROMGRNA in the last 8760 hours.  Assessment and Plan:  Non-small cell  lung cancer with mediastinal lymphadenopathy concerning for disease progression/recurrence.  Will proceed with image guided biopsy today by Dr. Vernard Gambles.  Risks and benefits of lymph node biopsy was discussed with the patient and/or patient's family including, but not limited to bleeding, infection,  damage to adjacent structures or low yield requiring additional tests.  All of the questions were answered and there is agreement to proceed.  Consent signed and in chart.  I greatly enjoyed meeting Jennifer [name] and look forward to participating in their care.  A copy of this report was sent to the requesting provider on this date.  Electronically Signed: Murrell Redden, PA-C   11/09/2020, 8:14 AM      I spent a total of  30 Minutes   in  face to face in clinical consultation, greater than 50% of which was counseling/coordinating care for lymph node biopsy.

## 2020-11-09 ENCOUNTER — Encounter (HOSPITAL_COMMUNITY): Payer: Self-pay

## 2020-11-09 ENCOUNTER — Ambulatory Visit (HOSPITAL_COMMUNITY): Payer: Medicare Other

## 2020-11-09 ENCOUNTER — Other Ambulatory Visit: Payer: Self-pay

## 2020-11-09 ENCOUNTER — Ambulatory Visit (HOSPITAL_COMMUNITY)
Admission: RE | Admit: 2020-11-09 | Discharge: 2020-11-09 | Disposition: A | Discharge status: Home / Self Care | Payer: Medicare Other | Source: Ambulatory Visit | Attending: Internal Medicine | Admitting: Internal Medicine

## 2020-11-09 DIAGNOSIS — C3492 Malignant neoplasm of unspecified part of left bronchus or lung: Secondary | ICD-10-CM | POA: Insufficient documentation

## 2020-11-09 DIAGNOSIS — R590 Localized enlarged lymph nodes: Secondary | ICD-10-CM | POA: Insufficient documentation

## 2020-11-09 DIAGNOSIS — Z87891 Personal history of nicotine dependence: Secondary | ICD-10-CM | POA: Insufficient documentation

## 2020-11-09 LAB — PROTIME-INR
INR: 0.9 (ref 0.8–1.2)
Prothrombin Time: 12.5 seconds (ref 11.4–15.2)

## 2020-11-09 LAB — CBC
HCT: 39.9 % (ref 36.0–46.0)
Hemoglobin: 13.6 g/dL (ref 12.0–15.0)
MCH: 32.2 pg (ref 26.0–34.0)
MCHC: 34.1 g/dL (ref 30.0–36.0)
MCV: 94.3 fL (ref 80.0–100.0)
Platelets: 211 10*3/uL (ref 150–400)
RBC: 4.23 MIL/uL (ref 3.87–5.11)
RDW: 13.7 % (ref 11.5–15.5)
WBC: 10.3 10*3/uL (ref 4.0–10.5)
nRBC: 0 % (ref 0.0–0.2)

## 2020-11-09 MED ORDER — FENTANYL CITRATE (PF) 100 MCG/2ML IJ SOLN
INTRAMUSCULAR | Status: DC | PRN
  Administered 2020-11-09 (×2): 50 ug via INTRAVENOUS

## 2020-11-09 MED ORDER — LIDOCAINE HCL 1 % IJ SOLN
INTRAMUSCULAR | Status: AC
  Filled 2020-11-09: qty 10

## 2020-11-09 MED ORDER — FENTANYL CITRATE (PF) 100 MCG/2ML IJ SOLN
INTRAMUSCULAR | Status: AC
  Filled 2020-11-09: qty 4

## 2020-11-09 MED ORDER — MIDAZOLAM HCL 2 MG/2ML IJ SOLN
INTRAMUSCULAR | Status: DC | PRN
  Administered 2020-11-09 (×2): 1 mg via INTRAVENOUS

## 2020-11-09 MED ORDER — SODIUM CHLORIDE 0.9 % IV SOLN: INTRAVENOUS | Status: DC

## 2020-11-09 MED ORDER — HYDROCODONE-ACETAMINOPHEN 5-325 MG PO TABS
1.0000 | ORAL_TABLET | ORAL | Status: DC | PRN
  Filled 2020-11-09: qty 2

## 2020-11-09 MED ORDER — MIDAZOLAM HCL 2 MG/2ML IJ SOLN
INTRAMUSCULAR | Status: AC
  Filled 2020-11-09: qty 6

## 2020-11-09 NOTE — Progress Notes (Signed)
Pt ambulated without difficulty or bleeding.   Discharged home with her daughter who will drive and stay with pt x 24 hrs.

## 2020-11-10 LAB — SURGICAL PATHOLOGY

## 2020-11-14 ENCOUNTER — Encounter (HOSPITAL_COMMUNITY): Payer: Self-pay | Admitting: Internal Medicine

## 2020-11-15 NOTE — Progress Notes (Signed)
[facility] OFFICE PROGRESS NOTE  Beverley Fiedler, FNP [place] [address]  DIAGNOSIS: Recurrent non-small cell lung cancer that was initially diagnosed as stage IB (T2 a, N0, M0) non-small cell lung cancer, adenocarcinoma diagnosed in September 202. She is status post left upper lobectomy with lymph node dissection with tumor size of 3.2 cm and negative lymphadenopathy at that time. She had disease recurrence in July 2022 with anterior mediastinal lymphadenopathy which was consistent with metastatic non-small cell lung cancer.   Biomarker Findings Tumor Mutational Burden - 21 Muts/Mb Microsatellite status - MS-Stable Genomic Findings For a complete list of the genes assayed, please refer to the Appendix. KRAS G12V TP53 F134L 7 Disease relevant genes with no reportable alterations: ALK, BRAF, EGFR, ERBB2, MET, RET, ROS1  PDL1: 0%  PRIOR THERAPY: Left upper lobectomy under the care of Dr. Kipp Brood in November 2020.  CURRENT THERAPY: Referral to radiation oncology   INTERVAL HISTORY: Jennifer [name] 75 y.o. female returns to the clinic today for a follow-up visit.  The patient was recently found to have suspicious disease recurrence.  She was initially diagnosed as a stage Ib non-small cell lung cancer, adenocarcinoma in September 2020.  She is status post a left upper lobectomy. She had some complications following her surgery with persistent airleak. When she saw Dr. Kipp Brood a few weeks ago for the suspicious disease recurrence, the patient states that he said if this is proven to be cancer, then likely radiation would be a better option over surgery.   Recent imaging studies showed suspicious disease recurrence with increased adenopathy in the mediastinum.  This was recently biopsied by interventional radiology on 11/09/2020 which was consistent with recurrent non-small cell lung cancer.  Since her last appointment, the patient's been feeling well.  She denies any recent fever, chills, night sweats, or unexplained weight loss.  She denies any chest pain, shortness of breath, cough, or hemoptysis.  She denies any headache or visual changes.  She denies any nausea, vomiting, diarrhea, or constipation.  She is here today for evaluation and for more detailed discussion of her current condition and recommended treatment options.    MEDICAL HISTORY: Past Medical History:  Diagnosis Date   History of chicken pox    Hyperlipidemia    Hypertension    Menopause    Osteoporosis    Pneumonia    "walking" pneumonia   Pre-diabetes    Vitamin D deficiency     ALLERGIES:  is allergic to aspirin and latex.  MEDICATIONS:  Current Outpatient Medications  Medication Sig Dispense Refill   atorvastatin (LIPITOR) 40 MG tablet Take 40 mg by mouth every evening.      diltiazem (DILACOR XR) 240 MG 24 hr capsule Take 240 mg by mouth daily.     guaiFENesin (MUCINEX) 600 MG 12 hr tablet Take 1 tablet (600 mg total) by mouth 2 (two) times daily. (Patient taking differently: Take 600 mg by mouth daily.)     metoprolol succinate (TOPROL-XL) 25 MG 24 hr tablet Take 25 mg by mouth daily.     Multiple Vitamin (MULTIVITAMIN WITH MINERALS) TABS tablet Take 1 tablet by mouth daily.     triamterene-hydrochlorothiazide (MAXZIDE-25) 37.5-25 MG tablet Take 1 tablet by mouth daily.     No current facility-administered medications for this visit.    SURGICAL HISTORY:  Past Surgical History:  Procedure Laterality Date   CHEST TUBE INSERTION Left 01/19/2019   Procedure: Chest Tube Insertion;  Surgeon: Lajuana Matte,  MD;  Location: [facility];  Service: Thoracic;  Laterality: Left;   PLEURADESIS N/A 01/06/2019   Procedure: Pleuradesis - Chemical;  Surgeon: Lajuana Matte, MD;  Location: [facility];  Service: Thoracic;  Laterality: N/A;   PLEURADESIS Left 01/19/2019   Procedure: Mechanical Pleuradesis;  Surgeon: Lajuana Matte, MD;  Location: [facility];  Service:  Thoracic;  Laterality: Left;   VIDEO ASSISTED THORACOSCOPY Left 01/19/2019   Procedure: VIDEO ASSISTED THORACOSCOPY;  Surgeon: Lajuana Matte, MD;  Location: [facility];  Service: Thoracic;  Laterality: Left;   VIDEO ASSISTED THORACOSCOPY (VATS)/ LOBECTOMY Left 12/29/2018   Procedure: VIDEO ASSISTED THORACOSCOPY (VATS)/LEFT UPPER  LOBECTOMY with Mediastinal lymph node exploration.;  Surgeon: Lajuana Matte, MD;  Location: [facility];  Service: Thoracic;  Laterality: Left;   VIDEO BRONCHOSCOPY N/A 12/29/2018   Procedure: VIDEO BRONCHOSCOPY;  Surgeon: Lajuana Matte, MD;  Location: [facility];  Service: Thoracic;  Laterality: N/A;   [place] N/A 12/09/2018   Procedure: VIDEO BRONCHOSCOPY WITH ENDOBRONCHIAL NAVIGATION;  Surgeon: Lajuana Matte, MD;  Location: [facility];  Service: Thoracic;  Laterality: N/A;   VIDEO BRONCHOSCOPY WITH INSERTION OF INTERBRONCHIAL VALVE (IBV) N/A 01/06/2019   Procedure: VIDEO BRONCHOSCOPY WITH INSERTION OF INTERBRONCHIAL VALVE (IBV);  Surgeon: Lajuana Matte, MD;  Location: [facility];  Service: Thoracic;  Laterality: N/A;   VIDEO BRONCHOSCOPY WITH INSERTION OF INTERBRONCHIAL VALVE (IBV) N/A 01/13/2019   Procedure: VIDEO BRONCHOSCOPY WITH INSERTION OF INTERBRONCHIAL VALVE (IBV);  Surgeon: Lajuana Matte, MD;  Location: [facility];  Service: Thoracic;  Laterality: N/A;   VIDEO BRONCHOSCOPY WITH INSERTION OF INTERBRONCHIAL VALVE (IBV) N/A 02/23/2019   Procedure: VIDEO BRONCHOSCOPY WITH REMOVAL OF INTERBRONCHIAL VALVE (IBV);  Surgeon: Lajuana Matte, MD;  Location[facility];  Service: Thoracic;  Laterality: N/A;    REVIEW OF SYSTEMS:   Review of Systems  Constitutional: Negative for appetite change, chills, fatigue, fever and unexpected weight change.  HENT:   Negative for mouth sores, nosebleeds, sore throat and trouble swallowing.   Eyes: Negative for eye problems and icterus.  Respiratory: Negative for cough, hemoptysis,  shortness of breath and wheezing.   Cardiovascular: Negative for chest pain and leg swelling.  Gastrointestinal: Negative for abdominal pain, constipation, diarrhea, nausea and vomiting.  Genitourinary: Negative for bladder incontinence, difficulty urinating, dysuria, frequency and hematuria.   Musculoskeletal: Negative for back pain, gait problem, neck pain and neck stiffness.  Skin: Negative for itching and rash.  Neurological: Negative for dizziness, extremity weakness, gait problem, headaches, light-headedness and seizures.  Hematological: Negative for adenopathy. Does not bruise/bleed easily.  Psychiatric/Behavioral: Negative for confusion, depression and sleep disturbance. The patient is not nervous/anxious.     PHYSICAL EXAMINATION:  Blood pressure 138/68, pulse 85, temperature 98.3 F (36.8 C), resp. rate 18, weight 165 lb 9.6 oz (75.1 kg), SpO2 100 %.  ECOG PERFORMANCE STATUS: 0  Physical Exam  Constitutional: Oriented to person, place, and time and well-developed, well-nourished, and in no distress.  HENT:  Head: Normocephalic and atraumatic.  Mouth/Throat: Oropharynx is clear and moist. No oropharyngeal exudate.  Eyes: Conjunctivae are normal. Right eye exhibits no discharge. Left eye exhibits no discharge. No scleral icterus.  Neck: Normal range of motion. Neck supple.  Cardiovascular: Normal rate, regular rhythm, normal heart sounds and intact distal pulses.   Pulmonary/Chest: Effort normal and breath sounds normal. No respiratory distress. No wheezes. No rales.  Abdominal: Soft. Bowel sounds are normal. Exhibits no distension and no mass. There is  no tenderness.  Musculoskeletal: Normal range of motion. Exhibits no edema.  Lymphadenopathy:    No cervical adenopathy.  Neurological: Alert and oriented to person, place, and time. Exhibits normal muscle tone. Gait normal. Coordination normal.  Skin: Skin is warm and dry. No rash noted. Not diaphoretic. No erythema. No pallor.   Psychiatric: Mood, memory and judgment normal.  Vitals reviewed.  LABORATORY DATA: Lab Results  Component Value Date   WBC 9.6 11/17/2020   HGB 12.3 11/17/2020   HCT 35.6 (L) 11/17/2020   MCV 90.8 11/17/2020   PLT 219 11/17/2020      Chemistry      Component Value Date/Time   NA 138 11/17/2020 1424   K 3.4 (L) 11/17/2020 1424   CL 100 11/17/2020 1424   CO2 27 11/17/2020 1424   BUN 16 11/17/2020 1424   CREATININE 1.11 (H) 11/17/2020 1424   GLU 845 10/28/2009 0000      Component Value Date/Time   CALCIUM 9.6 11/17/2020 1424   ALKPHOS 108 11/17/2020 1424   AST 23 11/17/2020 1424   ALT 20 11/17/2020 1424   BILITOT 0.5 11/17/2020 1424       RADIOGRAPHIC STUDIES:  CT BIOPSY  Result Date: 11/10/2020 CLINICAL DATA:  History of non-small cell lung carcinoma. Hypermetabolic anterior mediastinal node on recent PET-CT EXAM: CT GUIDED CORE BIOPSY OF ANTERIOR MEDIASTINAL ADENOPATHY ANESTHESIA/SEDATION: Intravenous Fentanyl 159mg and Versed 260mwere administered as conscious sedation during continuous monitoring of the patient's level of consciousness and physiological / cardiorespiratory status by the radiology RN, with a total moderate sedation time of 13 minutes. PROCEDURE: The procedure risks, benefits, and alternatives were explained to the patient. Questions regarding the procedure were encouraged and answered. The patient understands and consents to the procedure. Select axial scans through the chest were obtained in the anterior mediastinal adenopathy was localized. An appropriate skin entry site was determined and marked. The operative field was prepped with chlorhexidinein a sterile fashion, and a sterile drape was applied covering the operative field. A sterile gown and sterile gloves were used for the procedure. Local anesthesia was provided with 1% Lidocaine. Under CT fluoroscopic guidance, a 17 gauge trocar needle was advanced to the margin of the lesion. Once needle tip  position was confirmed, coaxial 18-gauge core biopsy samples were obtained, submitted in formalin to surgical pathology. The guide needle was removed. Postprocedure scans show no pneumothorax, hemorrhage or other apparent complication. COMPLICATIONS: None immediate FINDINGS: Anterior mediastinal adenopathy localized. Representative core biopsy samples obtained as above. IMPRESSION: 1. Technically successful CT-guided core biopsy, anterior mediastinal adenopathy. Electronically Signed   By: D Lucrezia Europe.D.   On: 11/10/2020 08:24     ASSESSMENT/PLAN:  This is a very pleasant [age]ear old Caucasian female with recurrent non-small cell lung cancer that was initially diagnosed with stage Ib (T2 a, N0, M0) non-small cell lung cancer, adenocarcinoma.  She was initially diagnosed in September of 2020.  She was initially diagnosed with a left upper lobe lesion status post left upper lobectomy lymph node dissection with a tumor size of 3.2 and negative lymphadenopathy at that time.  The patient and September 2022 was found to have disease recurrence with biopsy-proven metastatic non-small cell lung cancer in the mediastinal lymph node.  PD-L1 expression is 0%.  She is negative for any actionable mutations.   The patient was seen with Dr. MoJulien Nordmannoday.  Dr. MoJulien Nordmannad a lengthy discussion with the patient about her current condition and recommended treatment options. Since the patient's disease  recurrence is in the mediastinal lymph nodes and prior complications from surgery, the patient is reluctant to pursue surgery. Dr. [name] recommends that we refer the patient to radiation oncology for consideration of SBRT. I have placed the referral. Dr. [name] discussed that should the patient ever need systemic treatment in the future, treatment would likely be chemotherapy as she has no actionable mutations.   I will arrange for a restaging CT scan of the chest in 4 months for evaluation and to closely monitor her.   We  will see her back for a follow up appointment in 4 months for evaluation and to review her scan results.   The patient was advised to call immediately if she has any concerning symptoms in the interval. The patient voices understanding of current disease status and treatment options and is in agreement with the current care plan. All questions were answered. The patient knows to call the clinic with any problems, questions or concerns. We can certainly see the patient much sooner if necessary      Orders Placed This Encounter  Procedures   CT Chest W Contrast    Standing Status:   Future    Standing Expiration Date:   11/17/2021    Order Specific Question:   If indicated for the ordered procedure, I authorize the administration of contrast media per Radiology protocol    Answer:   Yes    Order Specific Question:   Preferred imaging location?    Answer:   [facility] ([facility] only)    Standing Status:   Future    Standing Expiration Date:   11/17/2021   CBC with Differential (Cancer Center Only)    Standing Status:   Future    Standing Expiration Date:   11/17/2021   Ambulatory referral to Radiation Oncology    Referral Priority:   Routine    Referral Type:   Consultation    Referral Reason:   Specialty Services Required    Requested Specialty:   Radiation Oncology    Number of Visits Requested:   [place], PA-C 11/17/20  ADDENDUM: Hematology/Oncology Attending: I had a face-to-face encounter with the patient.  I reviewed her records, scan, lab, biopsy and recommended her care plan.  This is a very pleasant 75 years old white female who was initially diagnosed with stage Ib (T2 a, N0, M0) non-small cell lung cancer, adenocarcinoma initially in September 2020 status post left upper lobectomy with lymph node dissection.  The patient has been in observation since that time but recent imaging studies including CT scan of the chest as  well as PET scan showed concerning findings for disease recurrence presented as anterior mediastinal adenopathy. The patient underwent CT-guided core biopsy of the lesion by interventional radiology and the final pathology was consistent with disease recurrence with adenocarcinoma.  She had molecular studies that showed no actionable mutations and PD-L1 expression was negative. I had a lengthy discussion with the patient today about her current condition and treatment options.  I gave the patient the option of seeing Dr. Kipp Brood again for consideration of surgical resection but she was also given the option of curative radiation.  The patient mentioned that she is not very interested in surgery at this point and her feeling that Dr. Kipp Brood is not also interested in proceeding with surgery. Will arrange for her to see radiation oncology for consideration of curative SBRT to this  lesion. I will see the patient back for follow-up visit in around 4 months for evaluation and repeat CT scan of the chest for close monitoring of her disease and to rule out any other disease recurrence or progression. The patient is in agreement with the current plan. She was advised to call immediately if she has any concerning symptoms in the interval. Disclaimer: This note was dictated with voice recognition software. Similar sounding words can inadvertently be transcribed and may be missed upon review. Eilleen Kempf, MD 11/18/20

## 2020-11-16 ENCOUNTER — Other Ambulatory Visit: Payer: Self-pay | Admitting: Physician Assistant

## 2020-11-16 DIAGNOSIS — C3492 Malignant neoplasm of unspecified part of left bronchus or lung: Secondary | ICD-10-CM

## 2020-11-17 ENCOUNTER — Inpatient Hospital Stay: Payer: Medicare Other | Attending: Internal Medicine | Admitting: Physician Assistant

## 2020-11-17 ENCOUNTER — Encounter (HOSPITAL_COMMUNITY): Payer: Self-pay

## 2020-11-17 ENCOUNTER — Other Ambulatory Visit: Payer: Self-pay

## 2020-11-17 ENCOUNTER — Inpatient Hospital Stay: Payer: Medicare Other

## 2020-11-17 DIAGNOSIS — C3412 Malignant neoplasm of upper lobe, left bronchus or lung: Secondary | ICD-10-CM | POA: Insufficient documentation

## 2020-11-17 DIAGNOSIS — C3490 Malignant neoplasm of unspecified part of unspecified bronchus or lung: Secondary | ICD-10-CM | POA: Insufficient documentation

## 2020-11-17 DIAGNOSIS — C771 Secondary and unspecified malignant neoplasm of intrathoracic lymph nodes: Secondary | ICD-10-CM | POA: Diagnosis not present

## 2020-11-17 DIAGNOSIS — C3492 Malignant neoplasm of unspecified part of left bronchus or lung: Secondary | ICD-10-CM

## 2020-11-17 LAB — CMP (CANCER CENTER ONLY)
ALT: 20 U/L (ref 0–44)
AST: 23 U/L (ref 15–41)
Albumin: 4.2 g/dL (ref 3.5–5.0)
Alkaline Phosphatase: 108 U/L (ref 38–126)
Anion gap: 11 (ref 5–15)
BUN: 16 mg/dL (ref 8–23)
CO2: 27 mmol/L (ref 22–32)
Calcium: 9.6 mg/dL (ref 8.9–10.3)
Chloride: 100 mmol/L (ref 98–111)
Creatinine: 1.11 mg/dL — ABNORMAL HIGH (ref 0.44–1.00)
GFR, Estimated: 52 mL/min — ABNORMAL LOW (ref >60)
Glucose, Bld: 135 mg/dL — ABNORMAL HIGH (ref 70–99)
Potassium: 3.4 mmol/L — ABNORMAL LOW (ref 3.5–5.1)
Sodium: 138 mmol/L (ref 135–145)
Total Bilirubin: 0.5 mg/dL (ref 0.3–1.2)
Total Protein: 7.4 g/dL (ref 6.5–8.1)

## 2020-11-17 LAB — CBC WITH DIFFERENTIAL (CANCER CENTER ONLY)
Abs Immature Granulocytes: 0.04 10*3/uL (ref 0.00–0.07)
Basophils Absolute: 0 10*3/uL (ref 0.0–0.1)
Basophils Relative: 0 %
Eosinophils Absolute: 0.1 10*3/uL (ref 0.0–0.5)
Eosinophils Relative: 1 %
HCT: 35.6 % — ABNORMAL LOW (ref 36.0–46.0)
Hemoglobin: 12.3 g/dL (ref 12.0–15.0)
Immature Granulocytes: 0 %
Lymphocytes Relative: 22 %
Lymphs Abs: 2.1 10*3/uL (ref 0.7–4.0)
MCH: 31.4 pg (ref 26.0–34.0)
MCHC: 34.6 g/dL (ref 30.0–36.0)
MCV: 90.8 fL (ref 80.0–100.0)
Monocytes Absolute: 0.7 10*3/uL (ref 0.1–1.0)
Monocytes Relative: 7 %
Neutro Abs: 6.7 10*3/uL (ref 1.7–7.7)
Neutrophils Relative %: 70 %
Platelet Count: 219 10*3/uL (ref 150–400)
RBC: 3.92 MIL/uL (ref 3.87–5.11)
RDW: 13.6 % (ref 11.5–15.5)
WBC Count: 9.6 10*3/uL (ref 4.0–10.5)
nRBC: 0 % (ref 0.0–0.2)

## 2020-11-17 IMAGING — PT NM PET TUM IMG INITIAL (PI) SKULL BASE T - THIGH
1 of 8 series · 1 of 25 positions shown · non-contrast
Comparison: None available

CLINICAL DATA: Subsequent treatment strategy for pulmonary nodule.

EXAM:
NUCLEAR MEDICINE PET SKULL BASE TO THIGH
TECHNIQUE: 7.27 mCi F-18 FDG was injected intravenously. Full-ring PET imaging
was performed from the skull base to thigh after the radiotracer. CT
data was obtained and used for attenuation correction and anatomic
localization.
Fasting blood glucose: 116 mg/dl

[Series 4: ct sk_thigh 5.0 b31f · axial · 5.0mm · 0.88mm/px · 1 of 239 slices shown]
[im 239/239  brain]
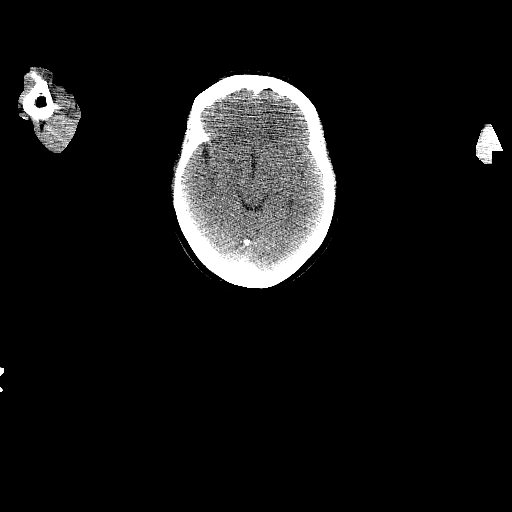

[1 of 25 positions shown; findings below may reference images not displayed]

FINDINGS: Mediastinal blood pool activity: SUV max

Liver activity: SUV max

NECK: No hypermetabolic lymph nodes in the neck.

Incidental CT findings: none

CHEST: Within the LEFT upper lobe rounded nodule with spiculated
margin measures 1.8 x 1.9 cm and has moderate metabolic activity
with SUV max equal 4.2.

No additional suspicious pulmonary nodules.

No enlarged or hypermetabolic mediastinal lymph nodes.

Incidental CT findings: none

ABDOMEN/PELVIS: No abnormal hypermetabolic activity within the
liver, pancreas, adrenal glands, or spleen. No hypermetabolic lymph
nodes in the abdomen or pelvis.

Enlargement of the LEFT adrenal gland has low attenuation consistent
benign adenoma.

Incidental CT findings: Atherosclerotic calcification of the aorta.
Uterus and ovaries normal.

SKELETON: No focal hypermetabolic activity to suggest skeletal
metastasis.

Incidental CT findings: none
IMPRESSION: 1. Hypermetabolic LEFT upper lobe pulmonary nodule most consistent
with primary bronchogenic carcinoma. No hypermetabolic mediastinal
lymphadenopathy. No distant metastatic disease. PET-CT staging T1b
N0 M0.
2. Benign LEFT adrenal adenoma.
3.  Aortic Atherosclerosis ([id number]).

## 2020-11-17 NOTE — Patient Instructions (Signed)
-  The place where they took the biopsy showed recurrent cancer.  -We will refer you to radiation oncology to discuss zapping this spot with radiation. They are located in the basement of this building. -We will see you in 4 months. A few days before your appointment, we would like you to get a CT scan of the chest about 2-3 days before the appointment. Then we will see you a few days later to review the results of the scan. We will be the ones to keep a close eye on your with routine scans every few months -We checked you for markers to see if you would qualify for a pill in the future if needed. Unfortunately, this was negative. Therefore, if you needed treatment in the future, treatment would likely be chemotherapy.

## 2020-11-21 NOTE — Progress Notes (Signed)
Thoracic Location of Tumor / Histology: Recurrent Non Small Cell Lung Cancer- LUL  Patient presented for surveillance scans for LUL non small cell lung cancer.  PET 06/19/5359: Hypermetabolic lymph nodes in the anterior mediastinum compatible with disease recurrence.  No additional signs of disease in the neck, chest, abdomen or pelvis.  CT Chest 09/23/2020: Slightly increased size of prevascular lymph nodes. The largest measures measuring 1.1 cm in short axis on series 2 image 46., previously measured 1.0 cm.  Biopsies of Mediastinal Node 11/09/2020   Tobacco/Marijuana/Snuff/ETOH use: Former smoker  Past/Anticipated interventions by cardiothoracic surgery, if any:  Dr. Kipp Brood -Left Upper Lobectomy with lymph node dissection 01/19/2019   Past/Anticipated interventions by medical oncology, if any:  [facility]/Dr. [name] 11/17/2020 -Since the patient's disease recurrence is in the mediastinal lymph nodes and prior complications from surgery, the patient is reluctant to pursue surgery.  -Dr. [name] recommends that we refer the patient to radiation oncology for consideration of SBRT. I have placed the referral.  -Dr. [name] discussed that should the patient ever need systemic treatment in the future, treatment would likely be chemotherapy as she has no actionable mutations.   Signs/Symptoms Weight changes, if any: Fluctuates a couple of pounds, no drastic weight loss noted. Respiratory complaints, if any: Denies changes Hemoptysis, if any: None noted. Pain issues, if any:  No  SAFETY ISSUES: Prior radiation? No Pacemaker/ICD?  No Possible current pregnancy? Postmenopausal Is the patient on methotrexate? No  Current Complaints / other details:

## 2020-11-22 ENCOUNTER — Encounter: Payer: Self-pay | Admitting: Radiation Oncology

## 2020-11-22 ENCOUNTER — Ambulatory Visit
Admission: RE | Admit: 2020-11-22 | Discharge: 2020-11-22 | Disposition: A | Discharge status: Home / Self Care | Payer: Medicare Other | Source: Ambulatory Visit | Attending: Radiation Oncology | Admitting: Radiation Oncology

## 2020-11-22 ENCOUNTER — Other Ambulatory Visit: Payer: Self-pay

## 2020-11-22 VITALS — BP 132/63 | HR 91 | Temp 98.5°F | Resp 20 | Ht 69.0 in | Wt 164.8 lb

## 2020-11-22 DIAGNOSIS — C3412 Malignant neoplasm of upper lobe, left bronchus or lung: Secondary | ICD-10-CM | POA: Diagnosis not present

## 2020-11-22 DIAGNOSIS — E559 Vitamin D deficiency, unspecified: Secondary | ICD-10-CM | POA: Diagnosis not present

## 2020-11-22 DIAGNOSIS — C781 Secondary malignant neoplasm of mediastinum: Secondary | ICD-10-CM | POA: Diagnosis not present

## 2020-11-22 DIAGNOSIS — M810 Age-related osteoporosis without current pathological fracture: Secondary | ICD-10-CM | POA: Insufficient documentation

## 2020-11-22 DIAGNOSIS — C3492 Malignant neoplasm of unspecified part of left bronchus or lung: Secondary | ICD-10-CM

## 2020-11-22 DIAGNOSIS — Z79899 Other long term (current) drug therapy: Secondary | ICD-10-CM | POA: Insufficient documentation

## 2020-11-22 DIAGNOSIS — Z87891 Personal history of nicotine dependence: Secondary | ICD-10-CM | POA: Diagnosis not present

## 2020-11-22 DIAGNOSIS — C3490 Malignant neoplasm of unspecified part of unspecified bronchus or lung: Secondary | ICD-10-CM

## 2020-11-22 DIAGNOSIS — I10 Essential (primary) hypertension: Secondary | ICD-10-CM | POA: Insufficient documentation

## 2020-11-22 DIAGNOSIS — E785 Hyperlipidemia, unspecified: Secondary | ICD-10-CM | POA: Diagnosis not present

## 2020-11-22 NOTE — Progress Notes (Signed)
Radiation Oncology         [phone]) [phone] ________________________________  Name: Jennifer [name]        MRN: [id number]  Date of Service: 11/22/2020 DOB: 11-23-1945  WU:[id number], Jennifer Roger, FNP  Jennifer Bears, MD     REFERRING PHYSICIAN: Curt Bears, MD   DIAGNOSIS: The primary encounter diagnosis was Recurrent non-small cell lung cancer (NSCLC) ([place]). A diagnosis of Adenocarcinoma of left lung, stage 1 (HCC) was also pertinent to this visit.   HISTORY OF PRESENT ILLNESS: Jennifer [name] is a 75 y.o. female seen at the request of Dr. [name] for diagnosis of lung cancer.  The patient was originally diagnosed with Stage IB, cT2aN0M0, NSCLC, adenocarcinoma of the LUL.  She was treated with left upper lobectomy in November 2020 with Dr. Kipp Brood.  Final pathology revealed a 3.2 cm tumor and no metastatic disease to the lymph nodes were identified.  She has been followed in surveillance, unfortunately recent imaging showed concerns for adenopathy in the chest along the mediastinum, the CT on 09/23/2020 reference to 1.1 cm prevascular lymph node previously 1 cm the year prior another prevascular node measuring 7 mm previously 4, and a PET scan on 10/14/2020 showed hypermetabolism in the anterior mediastinal lymph nodes concerning for recurrence submandibular uptake was also seen on the right.  She underwent a CT-guided biopsy of her mediastinal nodes on 11/09/2020 which showed metastatic non-small cell lung cancer.  She was not in favor of additional surgery.  Given these findings she has been counseled on the rationale to consider radiotherapy.  Dr. [name] plans to see her back for imaging in 4 months time.  PET imaging from 10/14/20    PREVIOUS RADIATION THERAPY: No   PAST MEDICAL HISTORY:  Past Medical History:  Diagnosis Date   History of chicken pox    Hyperlipidemia    Hypertension    Menopause    Osteoporosis    Pneumonia    "walking" pneumonia   Pre-diabetes    Vitamin D  deficiency        PAST SURGICAL HISTORY: Past Surgical History:  Procedure Laterality Date   CHEST TUBE INSERTION Left 01/19/2019   Procedure: Chest Tube Insertion;  Surgeon: Lajuana Matte, MD;  Location: [facility];  Service: Thoracic;  Laterality: Left;   PLEURADESIS N/A 01/06/2019   Procedure: Pleuradesis - Chemical;  Surgeon: Lajuana Matte, MD;  Location: [facility];  Service: Thoracic;  Laterality: N/A;   PLEURADESIS Left 01/19/2019   Procedure: Mechanical Pleuradesis;  Surgeon: Lajuana Matte, MD;  Location: [facility];  Service: Thoracic;  Laterality: Left;   VIDEO ASSISTED THORACOSCOPY Left 01/19/2019   Procedure: VIDEO ASSISTED THORACOSCOPY;  Surgeon: Lajuana Matte, MD;  Location: [facility];  Service: Thoracic;  Laterality: Left;   VIDEO ASSISTED THORACOSCOPY (VATS)/ LOBECTOMY Left 12/29/2018   Procedure: VIDEO ASSISTED THORACOSCOPY (VATS)/LEFT UPPER  LOBECTOMY with Mediastinal lymph node exploration.;  Surgeon: Lajuana Matte, MD;  Location: [facility];  Service: Thoracic;  Laterality: Left;   VIDEO BRONCHOSCOPY N/A 12/29/2018   Procedure: VIDEO BRONCHOSCOPY;  Surgeon: Lajuana Matte, MD;  Location: [facility];  Service: Thoracic;  Laterality: N/A;   [place] N/A 12/09/2018   Procedure: VIDEO BRONCHOSCOPY WITH ENDOBRONCHIAL NAVIGATION;  Surgeon: Lajuana Matte, MD;  Location: [facility];  Service: Thoracic;  Laterality: N/A;   VIDEO BRONCHOSCOPY WITH INSERTION OF INTERBRONCHIAL VALVE (IBV) N/A 01/06/2019   Procedure: VIDEO BRONCHOSCOPY WITH INSERTION OF INTERBRONCHIAL VALVE (IBV);  Surgeon: Melodie Bouillon  O, MD;  Location: [facility];  Service: Thoracic;  Laterality: N/A;   VIDEO BRONCHOSCOPY WITH INSERTION OF INTERBRONCHIAL VALVE (IBV) N/A 01/13/2019   Procedure: VIDEO BRONCHOSCOPY WITH INSERTION OF INTERBRONCHIAL VALVE (IBV);  Surgeon: Lajuana Matte, MD;  [facility];  Service: Thoracic;  Laterality: N/A;   VIDEO  BRONCHOSCOPY WITH INSERTION OF INTERBRONCHIAL VALVE (IBV) N/A 02/23/2019   Procedure: VIDEO BRONCHOSCOPY WITH REMOVAL OF INTERBRONCHIAL VALVE (IBV);  Surgeon: Lajuana Matte, MD;  Location: [facility];  Service: Thoracic;  Laterality: N/A;     FAMILY HISTORY: History reviewed. No pertinent family history.   SOCIAL HISTORY:  reports that she has quit smoking. Her smoking use included cigarettes. She smoked an average of .5 packs per day. She has never used smokeless tobacco. She reports current alcohol use of about 1.0 standard drink per week. She reports that she does not use drugs. The patient is widowed and lives in [place].    ALLERGIES: Aspirin and Latex   MEDICATIONS:  Current Outpatient Medications  Medication Sig Dispense Refill   atorvastatin (LIPITOR) 40 MG tablet Take 40 mg by mouth every evening.      diltiazem (DILACOR XR) 240 MG 24 hr capsule Take 240 mg by mouth daily.     guaiFENesin (MUCINEX) 600 MG 12 hr tablet Take 1 tablet (600 mg total) by mouth 2 (two) times daily. (Patient taking differently: Take 600 mg by mouth daily.)     metoprolol succinate (TOPROL-XL) 25 MG 24 hr tablet Take 25 mg by mouth daily.     Multiple Vitamin (MULTIVITAMIN WITH MINERALS) TABS tablet Take 1 tablet by mouth daily.     triamterene-hydrochlorothiazide (MAXZIDE-25) 37.5-25 MG tablet Take 1 tablet by mouth daily.     No current facility-administered medications for this encounter.     REVIEW OF SYSTEMS: On review of systems, the patient reports that she is doing well overall. She does have some shortness of breath with exertion at times from prior surgery but no recent changes in breathing are noted. She has had a few pounds of weight change but no significant loss. She denies any cough and specifically denies hemoptysis. No chest pain is noted.      PHYSICAL EXAM:  Wt Readings from Last 3 Encounters:  11/22/20 164 lb 12.8 oz (74.8 kg)  11/17/20 165 lb 9.6 oz (75.1 kg)  11/09/20  150 lb (68 kg)   Temp Readings from Last 3 Encounters:  11/22/20 98.5 F (36.9 C)  11/17/20 98.3 F (36.8 C)  11/09/20 97.8 F (36.6 C) (Oral)   BP Readings from Last 3 Encounters:  11/22/20 132/63  11/17/20 138/68  11/09/20 (!) 122/54   Pulse Readings from Last 3 Encounters:  11/22/20 91  11/17/20 85  11/09/20 (!) 56   Pain Assessment Pain Score: 0-No pain/10  In general this is a well appearing caucasian female in no acute distress. she's alert and oriented x4 and appropriate throughout the examination. Cardiopulmonary assessment is negative for acute distress and she exhibits normal effort.     ECOG = 0  0 - Asymptomatic (Fully active, able to carry on all predisease activities without restriction)  1 - Symptomatic but completely ambulatory (Restricted in physically strenuous activity but ambulatory and able to carry out work of a light or sedentary nature. For example, light housework, office work)  2 - Symptomatic, <50% in bed during the day (Ambulatory and capable of all self care but unable to carry out any work activities. Up and  about more than 50% of waking hours)  3 - Symptomatic, >50% in bed, but not bedbound (Capable of only limited self-care, confined to bed or chair 50% or more of waking hours)  4 - Bedbound (Completely disabled. Cannot carry on any self-care. Totally confined to bed or chair)  5 - Death   Eustace Pen MM, Creech RH, Tormey DC, et al. [phone]). "Toxicity and response criteria of the [facility]". [place] Oncol. 5 (6): 649-55    LABORATORY DATA:  Lab Results  Component Value Date   WBC 9.6 11/17/2020   HGB 12.3 11/17/2020   HCT 35.6 (L) 11/17/2020   MCV 90.8 11/17/2020   PLT 219 11/17/2020   Lab Results  Component Value Date   NA 138 11/17/2020   K 3.4 (L) 11/17/2020   CL 100 11/17/2020   CO2 27 11/17/2020   Lab Results  Component Value Date   ALT 20 11/17/2020   AST 23 11/17/2020   ALKPHOS 108  11/17/2020   BILITOT 0.5 11/17/2020      RADIOGRAPHY: CT BIOPSY  Result Date: 11/10/2020 CLINICAL DATA:  History of non-small cell lung carcinoma. Hypermetabolic anterior mediastinal node on recent PET-CT EXAM: CT GUIDED CORE BIOPSY OF ANTERIOR MEDIASTINAL ADENOPATHY ANESTHESIA/SEDATION: Intravenous Fentanyl 135mcg and Versed 2mg  were administered as conscious sedation during continuous monitoring of the patient's level of consciousness and physiological / cardiorespiratory status by the radiology RN, with a total moderate sedation time of 13 minutes. PROCEDURE: The procedure risks, benefits, and alternatives were explained to the patient. Questions regarding the procedure were encouraged and answered. The patient understands and consents to the procedure. Select axial scans through the chest were obtained in the anterior mediastinal adenopathy was localized. An appropriate skin entry site was determined and marked. The operative field was prepped with chlorhexidinein a sterile fashion, and a sterile drape was applied covering the operative field. A sterile gown and sterile gloves were used for the procedure. Local anesthesia was provided with 1% Lidocaine. Under CT fluoroscopic guidance, a 17 gauge trocar needle was advanced to the margin of the lesion. Once needle tip position was confirmed, coaxial 18-gauge core biopsy samples were obtained, submitted in formalin to surgical pathology. The guide needle was removed. Postprocedure scans show no pneumothorax, hemorrhage or other apparent complication. COMPLICATIONS: None immediate FINDINGS: Anterior mediastinal adenopathy localized. Representative core biopsy samples obtained as above. IMPRESSION: 1. Technically successful CT-guided core biopsy, anterior mediastinal adenopathy. Electronically Signed   By: Lucrezia Europe M.D.   On: 11/10/2020 08:24       IMPRESSION/PLAN: 1. Recurrent Stage IB, cT2aN0M0, NSCLC, of the left upper lobe with disease in the  mediastinum. Dr. Lisbeth Renshaw discusses the pathology findings and reviews the nature of locally recurrent disease and the rationale to consider an ablative approach to radiotherapy. Dr. Lisbeth Renshaw discusses an SBRT Style dosing strategy over 8-10 total fractions to the mediastinal node.   We discussed the risks, benefits, short, and long term effects of radiotherapy, as well as the curative intent, and the patient is interested in proceeding. Dr. Lisbeth Renshaw discusses the delivery and logistics of radiotherapy. Written consent is obtained and placed in the chart, a copy was provided to the patient. She will be contacted by our department to coordinate simulation.   In a visit lasting 60 minutes, greater than 50% of the time was spent face to face discussing the patient's condition, in preparation for the discussion, and coordinating the patient's care.    The above documentation reflects  my direct findings during this shared patient visit. Please see the separate note by Dr. Lisbeth Renshaw on this date for the remainder of the patient's plan of care.    Carola Rhine, [facility]   **Disclaimer: This note was dictated with voice recognition software. Similar sounding words can inadvertently be transcribed and this note may contain transcription errors which may not have been corrected upon publication of note.**

## 2020-11-23 ENCOUNTER — Encounter: Payer: Self-pay | Admitting: *Deleted

## 2020-11-23 NOTE — Progress Notes (Signed)
[place] [facility]  Initial Assessment   Jennifer [name] is a 75 y.o. year old female contacted by phone. Clinical Social Work was referred by distress screen protocol for assessment of psychosocial needs.   SDOH (Social Determinants of Health) assessments performed: Yes SDOH Interventions    Flowsheet Row Most Recent Value  SDOH Interventions   Financial Strain Interventions Intervention Not Indicated  Housing Interventions Intervention Not Indicated  Transportation Interventions Intervention Not Indicated       Distress Screen completed: Yes ONCBCN DISTRESS SCREENING 11/22/2020  Screening Type Initial Screening  Distress experienced in past week (1-10) 5  Emotional problem type Depression;Nervousness/Anxiety      Family/Social Information:  Housing Arrangement: patient lives alone, husband died 5 years ago. Family members/support persons in your life? Family, Friends, and neighbors. Patient states that the cancer diagnosis has come at a time when she has relatively recently lost her husband, and her daughter just moved away. However, she has a son in [place]. Transportation concerns: no  Employment: Retired. Income source: Paediatric nurse concerns: No Type of concern: None Food access concerns: no Religious or spiritual practice: not discussed at this time. Medication Concerns: no  Services Currently in place:  N/A  Coping/ Adjustment to diagnosis: Patient understands treatment plan and what happens next? yes Concerns about diagnosis and/or treatment: none at this time Patient reported stressors: Anxiety and Adjusting to my illness Patient enjoys time with family/ friends and going to the beach. Current coping skills/ strengths: Ability for insight , Capable of independent living , and Supportive family/friends     SUMMARY: Current SDOH Barriers:  None identified at this time.   Interventions: Discussed common feeling and emotions when  being diagnosed with cancer, and the importance of support during treatment Informed patient of the support team roles and support services at [facility] Provided CSW contact information and encouraged patient to call with any questions or concerns Provided patient with information about art classes at [facility], which interests her.   Follow Up Plan: Patient will contact CSW with any support or resource needs Patient verbalizes understanding of plan: Yes   Rosary Lively, BSW Intern Supervised by: Kennith Center , LCSW

## 2020-11-24 ENCOUNTER — Other Ambulatory Visit: Payer: Self-pay

## 2020-11-24 ENCOUNTER — Ambulatory Visit
Admission: RE | Admit: 2020-11-24 | Discharge: 2020-11-24 | Disposition: A | Discharge status: Home / Self Care | Payer: Medicare Other | Source: Ambulatory Visit | Attending: Radiation Oncology | Admitting: Radiation Oncology

## 2020-11-24 DIAGNOSIS — C3412 Malignant neoplasm of upper lobe, left bronchus or lung: Secondary | ICD-10-CM | POA: Insufficient documentation

## 2020-11-29 ENCOUNTER — Ambulatory Visit: Payer: Medicare Other | Admitting: Radiation Oncology

## 2020-12-02 DIAGNOSIS — C3412 Malignant neoplasm of upper lobe, left bronchus or lung: Secondary | ICD-10-CM | POA: Diagnosis present

## 2020-12-05 ENCOUNTER — Ambulatory Visit
Admission: RE | Admit: 2020-12-05 | Discharge: 2020-12-05 | Disposition: A | Discharge status: Home / Self Care | Payer: Medicare Other | Source: Ambulatory Visit | Attending: Radiation Oncology | Admitting: Radiation Oncology

## 2020-12-05 ENCOUNTER — Other Ambulatory Visit: Payer: Self-pay

## 2020-12-05 DIAGNOSIS — C3412 Malignant neoplasm of upper lobe, left bronchus or lung: Secondary | ICD-10-CM | POA: Diagnosis not present

## 2020-12-06 ENCOUNTER — Ambulatory Visit
Admission: RE | Admit: 2020-12-06 | Discharge: 2020-12-06 | Disposition: A | Discharge status: Home / Self Care | Payer: Medicare Other | Source: Ambulatory Visit | Attending: Radiation Oncology | Admitting: Radiation Oncology

## 2020-12-06 DIAGNOSIS — C3412 Malignant neoplasm of upper lobe, left bronchus or lung: Secondary | ICD-10-CM | POA: Diagnosis not present

## 2020-12-07 ENCOUNTER — Ambulatory Visit
Admission: RE | Admit: 2020-12-07 | Discharge: 2020-12-07 | Disposition: A | Discharge status: Home / Self Care | Payer: Medicare Other | Source: Ambulatory Visit | Attending: Radiation Oncology | Admitting: Radiation Oncology

## 2020-12-07 ENCOUNTER — Other Ambulatory Visit: Payer: Self-pay

## 2020-12-07 DIAGNOSIS — C3412 Malignant neoplasm of upper lobe, left bronchus or lung: Secondary | ICD-10-CM | POA: Diagnosis not present

## 2020-12-08 ENCOUNTER — Ambulatory Visit
Admission: RE | Admit: 2020-12-08 | Discharge: 2020-12-08 | Disposition: A | Discharge status: Home / Self Care | Payer: Medicare Other | Source: Ambulatory Visit | Attending: Radiation Oncology | Admitting: Radiation Oncology

## 2020-12-08 DIAGNOSIS — C3412 Malignant neoplasm of upper lobe, left bronchus or lung: Secondary | ICD-10-CM | POA: Diagnosis not present

## 2020-12-09 ENCOUNTER — Other Ambulatory Visit: Payer: Self-pay

## 2020-12-09 ENCOUNTER — Ambulatory Visit
Admission: RE | Admit: 2020-12-09 | Discharge: 2020-12-09 | Disposition: A | Discharge status: Home / Self Care | Payer: Medicare Other | Source: Ambulatory Visit | Attending: Radiation Oncology | Admitting: Radiation Oncology

## 2020-12-09 DIAGNOSIS — C3412 Malignant neoplasm of upper lobe, left bronchus or lung: Secondary | ICD-10-CM | POA: Diagnosis not present

## 2020-12-12 ENCOUNTER — Other Ambulatory Visit: Payer: Self-pay

## 2020-12-12 ENCOUNTER — Ambulatory Visit
Admission: RE | Admit: 2020-12-12 | Discharge: 2020-12-12 | Disposition: A | Discharge status: Home / Self Care | Payer: Medicare Other | Source: Ambulatory Visit | Attending: Radiation Oncology | Admitting: Radiation Oncology

## 2020-12-12 DIAGNOSIS — C3412 Malignant neoplasm of upper lobe, left bronchus or lung: Secondary | ICD-10-CM | POA: Diagnosis not present

## 2020-12-13 ENCOUNTER — Ambulatory Visit
Admission: RE | Admit: 2020-12-13 | Discharge: 2020-12-13 | Disposition: A | Discharge status: Home / Self Care | Payer: Medicare Other | Source: Ambulatory Visit | Attending: Radiation Oncology | Admitting: Radiation Oncology

## 2020-12-13 ENCOUNTER — Other Ambulatory Visit: Payer: Self-pay

## 2020-12-13 DIAGNOSIS — C3412 Malignant neoplasm of upper lobe, left bronchus or lung: Secondary | ICD-10-CM | POA: Diagnosis not present

## 2020-12-14 ENCOUNTER — Ambulatory Visit
Admission: RE | Admit: 2020-12-14 | Discharge: 2020-12-14 | Disposition: A | Discharge status: Home / Self Care | Payer: Medicare Other | Source: Ambulatory Visit | Attending: Radiation Oncology | Admitting: Radiation Oncology

## 2020-12-14 DIAGNOSIS — C3412 Malignant neoplasm of upper lobe, left bronchus or lung: Secondary | ICD-10-CM | POA: Diagnosis not present

## 2020-12-15 ENCOUNTER — Ambulatory Visit
Admission: RE | Admit: 2020-12-15 | Discharge: 2020-12-15 | Disposition: A | Discharge status: Home / Self Care | Payer: Medicare Other | Source: Ambulatory Visit | Attending: Radiation Oncology | Admitting: Radiation Oncology

## 2020-12-15 ENCOUNTER — Other Ambulatory Visit: Payer: Self-pay

## 2020-12-15 DIAGNOSIS — C3412 Malignant neoplasm of upper lobe, left bronchus or lung: Secondary | ICD-10-CM | POA: Diagnosis not present

## 2020-12-16 ENCOUNTER — Encounter: Payer: Self-pay | Admitting: Radiation Oncology

## 2020-12-16 ENCOUNTER — Ambulatory Visit
Admission: RE | Admit: 2020-12-16 | Discharge: 2020-12-16 | Disposition: A | Discharge status: Home / Self Care | Payer: Medicare Other | Source: Ambulatory Visit | Attending: Radiation Oncology | Admitting: Radiation Oncology

## 2020-12-16 DIAGNOSIS — C3412 Malignant neoplasm of upper lobe, left bronchus or lung: Secondary | ICD-10-CM | POA: Diagnosis not present

## 2020-12-25 NOTE — Progress Notes (Signed)
                                                                                                                                                             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   Plan: The patient will receive a call in about one month from the radiation oncology department. She will continue follow up with Dr. [name] as well.   ________________________________________________    Carola Rhine, [facility]

## 2021-02-21 ENCOUNTER — Emergency Department (HOSPITAL_BASED_OUTPATIENT_CLINIC_OR_DEPARTMENT_OTHER)
Admission: EM | Admit: 2021-02-21 | Discharge: 2021-02-21 | Disposition: A | Discharge status: Home / Self Care | Payer: Medicare Other | Attending: Emergency Medicine | Admitting: Emergency Medicine

## 2021-02-21 ENCOUNTER — Encounter (HOSPITAL_BASED_OUTPATIENT_CLINIC_OR_DEPARTMENT_OTHER): Payer: Self-pay | Admitting: Obstetrics and Gynecology

## 2021-02-21 ENCOUNTER — Emergency Department (HOSPITAL_BASED_OUTPATIENT_CLINIC_OR_DEPARTMENT_OTHER): Payer: Medicare Other

## 2021-02-21 ENCOUNTER — Emergency Department (HOSPITAL_BASED_OUTPATIENT_CLINIC_OR_DEPARTMENT_OTHER): Payer: Medicare Other | Admitting: Radiology

## 2021-02-21 ENCOUNTER — Other Ambulatory Visit: Payer: Self-pay

## 2021-02-21 ENCOUNTER — Other Ambulatory Visit (HOSPITAL_BASED_OUTPATIENT_CLINIC_OR_DEPARTMENT_OTHER): Payer: Self-pay

## 2021-02-21 DIAGNOSIS — Z91040 Latex allergy status: Secondary | ICD-10-CM | POA: Insufficient documentation

## 2021-02-21 DIAGNOSIS — W010XXA Fall on same level from slipping, tripping and stumbling without subsequent striking against object, initial encounter: Secondary | ICD-10-CM | POA: Diagnosis not present

## 2021-02-21 DIAGNOSIS — Z87891 Personal history of nicotine dependence: Secondary | ICD-10-CM | POA: Insufficient documentation

## 2021-02-21 DIAGNOSIS — S022XXB Fracture of nasal bones, initial encounter for open fracture: Secondary | ICD-10-CM | POA: Diagnosis not present

## 2021-02-21 DIAGNOSIS — Z85118 Personal history of other malignant neoplasm of bronchus and lung: Secondary | ICD-10-CM | POA: Insufficient documentation

## 2021-02-21 DIAGNOSIS — Z23 Encounter for immunization: Secondary | ICD-10-CM | POA: Insufficient documentation

## 2021-02-21 DIAGNOSIS — E049 Nontoxic goiter, unspecified: Secondary | ICD-10-CM | POA: Diagnosis not present

## 2021-02-21 DIAGNOSIS — S0992XA Unspecified injury of nose, initial encounter: Secondary | ICD-10-CM | POA: Diagnosis present

## 2021-02-21 DIAGNOSIS — I10 Essential (primary) hypertension: Secondary | ICD-10-CM | POA: Insufficient documentation

## 2021-02-21 DIAGNOSIS — W19XXXA Unspecified fall, initial encounter: Secondary | ICD-10-CM

## 2021-02-21 DIAGNOSIS — Z79899 Other long term (current) drug therapy: Secondary | ICD-10-CM | POA: Insufficient documentation

## 2021-02-21 HISTORY — DX: Malignant (primary) neoplasm, unspecified: C80.1

## 2021-02-21 MED ORDER — LIDOCAINE HCL (PF) 1 % IJ SOLN
5.0000 mL | Freq: Once | INTRAMUSCULAR | Status: AC
  Administered 2021-02-21: 13:00:00 5 mL
  Filled 2021-02-21: qty 5

## 2021-02-21 MED ORDER — CEPHALEXIN 500 MG PO CAPS: 500.0000 mg | ORAL_CAPSULE | Freq: Two times a day (BID) | ORAL | 0 refills | Status: DC

## 2021-02-21 MED ORDER — CEPHALEXIN 500 MG PO CAPS
500.0000 mg | ORAL_CAPSULE | Freq: Two times a day (BID) | ORAL | 0 refills | Status: DC
  Filled 2021-02-21: qty 20, 10d supply, fill #0

## 2021-02-21 MED ORDER — TETANUS-DIPHTH-ACELL PERTUSSIS 5-2.5-18.5 LF-MCG/0.5 IM SUSY
0.5000 mL | PREFILLED_SYRINGE | Freq: Once | INTRAMUSCULAR | Status: AC
  Administered 2021-02-21: 13:00:00 0.5 mL via INTRAMUSCULAR
  Filled 2021-02-21: qty 0.5

## 2021-02-21 MED ORDER — LIDOCAINE-EPINEPHRINE-TETRACAINE (LET) TOPICAL GEL
3.0000 mL | Freq: Once | TOPICAL | Status: AC
  Administered 2021-02-21: 14:00:00 3 mL via TOPICAL
  Filled 2021-02-21: qty 3

## 2021-02-21 NOTE — ED Triage Notes (Signed)
Patient reports to the ER for a mechanical fall after a walk. Patient reports she went flying over a crack in the sidewalk. Denies LOC. Patient reports she was assessed by EMS. Reports left hand and left knee pain.

## 2021-02-21 NOTE — Discharge Instructions (Addendum)
It was a pleasure taking care of you here in the emergency department today  As discussed in the room the CT scan of your head and your neck did not show any evidence of traumatic injury  CT scan of your neck did show a slightly enlarged thyroid, would recommend following up with your primary care provider for reevaluation of the  Ct scan of your face that show a broken nose

## 2021-02-21 NOTE — ED Provider Notes (Signed)
[facility] EMERGENCY DEPT Provider Note   CSN: [id number] Arrival date & time: 02/21/21  1203    History Chief Complaint  Patient presents with   Lytle Michaels    Jennifer [name] is a 75 y.o. female with no significant past medical history who presents for evaluation mechanical fall.  Tripped and fell just prior to arrival.  Assessed by EMS at scene, declined transport at that time. Landed on left knee.  Hit bridge of her nose.  Has been ambulatory PTA.  Denies LOC, anticoagulation.  Unknown last tetanus.  Laceration to bridge of nose.  No chest pain, shortness of breath, headache, vision changes, numbness, weakness.  Denies additional aggravating or relieving factors.  History obtained from patient and past medical records.  No interpreter is used.  HPI     Past Medical History:  Diagnosis Date   Cancer ([place])    Lung cancer   History of chicken pox    Hyperlipidemia    Hypertension    Menopause    Osteoporosis    Pneumonia    "walking" pneumonia   Pre-diabetes    Vitamin D deficiency     Patient Active Problem List   Diagnosis Date Noted   Recurrent non-small cell lung cancer (NSCLC) ([place]) 11/17/2020   Adenocarcinoma of left lung, stage 1 ([place]) 10/26/2020   S/P lung surgery, follow-up exam 01/19/2019   Lung nodule 12/29/2018   Tobacco use 12/11/2011   Essential hypertension 12/11/2011    Past Surgical History:  Procedure Laterality Date   CHEST TUBE INSERTION Left 01/19/2019   Procedure: Chest Tube Insertion;  Surgeon: Lajuana Matte, MD;  Location: [place];  Service: Thoracic;  Laterality: Left;   PLEURADESIS N/A 01/06/2019   Procedure: Pleuradesis - Chemical;  Surgeon: Lajuana Matte, MD;  Location: [place];  Service: Thoracic;  Laterality: N/A;   PLEURADESIS Left 01/19/2019   Procedure: Mechanical Pleuradesis;  Surgeon: Lajuana Matte, MD;  Location: [place];  Service: Thoracic;  Laterality: Left;   VIDEO ASSISTED THORACOSCOPY Left 01/19/2019    Procedure: VIDEO ASSISTED THORACOSCOPY;  Surgeon: Lajuana Matte, MD;  Location: [place];  Service: Thoracic;  Laterality: Left;   VIDEO ASSISTED THORACOSCOPY (VATS)/ LOBECTOMY Left 12/29/2018   Procedure: VIDEO ASSISTED THORACOSCOPY (VATS)/LEFT UPPER  LOBECTOMY with Mediastinal lymph node exploration.;  Surgeon: Lajuana Matte, MD;  Location: [place];  Service: Thoracic;  Laterality: Left;   VIDEO BRONCHOSCOPY N/A 12/29/2018   Procedure: VIDEO BRONCHOSCOPY;  Surgeon: Lajuana Matte, MD;  Location: [place];  Service: Thoracic;  Laterality: N/A;   [place] N/A 12/09/2018   Procedure: VIDEO BRONCHOSCOPY WITH ENDOBRONCHIAL NAVIGATION;  Surgeon: Lajuana Matte, MD;  Location: [place];  Service: Thoracic;  Laterality: N/A;   VIDEO BRONCHOSCOPY WITH INSERTION OF INTERBRONCHIAL VALVE (IBV) N/A 01/06/2019   Procedure: VIDEO BRONCHOSCOPY WITH INSERTION OF INTERBRONCHIAL VALVE (IBV);  Surgeon: Lajuana Matte, MD;  Location: [place];  Service: Thoracic;  Laterality: N/A;   VIDEO BRONCHOSCOPY WITH INSERTION OF INTERBRONCHIAL VALVE (IBV) N/A 01/13/2019   Procedure: VIDEO BRONCHOSCOPY WITH INSERTION OF INTERBRONCHIAL VALVE (IBV);  Surgeon: Lajuana Matte, MD;  Location: [facility] OR;  Service: Thoracic;  Laterality: N/A;   VIDEO BRONCHOSCOPY WITH INSERTION OF INTERBRONCHIAL VALVE (IBV) N/A 02/23/2019   Procedure: VIDEO BRONCHOSCOPY WITH REMOVAL OF INTERBRONCHIAL VALVE (IBV);  Surgeon: Lajuana Matte, MD;  Location: [facility] OR;  Service: Thoracic;  Laterality: N/A;     OB History   No obstetric history  on file.     No family history on file.  Social History   Tobacco Use   Smoking status: Former    Packs/day: 0.50    Types: Cigarettes   Smokeless tobacco: Never   Tobacco comments:    has not had a cigarette since 12/04/18  Vaping Use   Vaping Use: Never used  Substance Use Topics   Alcohol use: Yes    Alcohol/week: 1.0 standard drink     Types: 1 Glasses of wine per week    Comment: daily   Drug use: No    Home Medications Prior to Admission medications   Medication Sig Start Date End Date Taking? Authorizing Provider  atorvastatin (LIPITOR) 40 MG tablet Take 40 mg by mouth every evening.  10/17/18  Yes [provider]  cephALEXin (KEFLEX) 500 MG capsule Take 1 capsule (500 mg total) by mouth 2 (two) times daily. 02/21/21  Yes Anber Mckiver A, PA-C  diltiazem (DILACOR XR) 240 MG 24 hr capsule Take 240 mg by mouth daily. 12/31/19  Yes [provider]  guaiFENesin (MUCINEX) 600 MG 12 hr tablet Take 1 tablet (600 mg total) by mouth 2 (two) times daily. Patient taking differently: Take 600 mg by mouth daily. 01/21/19  Yes Conte, Tessa N, PA-C  metoprolol succinate (TOPROL-XL) 25 MG 24 hr tablet Take 25 mg by mouth daily.   Yes [provider]  Multiple Vitamin (MULTIVITAMIN WITH MINERALS) TABS tablet Take 1 tablet by mouth daily.   Yes [provider]  triamterene-hydrochlorothiazide (MAXZIDE-25) 37.5-25 MG tablet Take 1 tablet by mouth daily. 09/12/20  Yes [provider]    Allergies    Aspirin and Latex  Review of Systems   Review of Systems  Constitutional: Negative.   HENT: Negative.    Respiratory: Negative.    Cardiovascular: Negative.   Gastrointestinal: Negative.   Genitourinary: Negative.   Musculoskeletal:  Negative for arthralgias, back pain, gait problem, joint swelling, myalgias, neck pain and neck stiffness.       Left knee  Skin:  Positive for wound.  Neurological: Negative.   All other systems reviewed and are negative.  Physical Exam Updated Vital Signs BP (!) 156/77    Pulse 98    Temp 97.8 F (36.6 C)    Resp 17    Ht 5\' 9"  (1.753 m)    Wt 72.6 kg    SpO2 99%    BMI 23.63 kg/m   Physical Exam Vitals and nursing note reviewed.  Constitutional:      General: She is not in acute distress.    Appearance: She is well-developed. She is not  ill-appearing, toxic-appearing or diaphoretic.  HENT:     Head: Normocephalic. Abrasion, contusion and laceration present. No raccoon eyes, Battle's sign, right periorbital erythema or left periorbital erythema.     Jaw: There is normal jaw occlusion.      Comments: No drooling, dysphagia, trismus.  No loose teeth.  No periorbital erythema, ecchymosis, no battle sign, raccoon eyes    Nose: Nasal deformity, signs of injury, laceration and nasal tenderness present. No septal deviation, mucosal edema or rhinorrhea.     Right Nostril: No foreign body, epistaxis, septal hematoma or occlusion.     Left Nostril: No foreign body, epistaxis, septal hematoma or occlusion.     Right Sinus: No maxillary sinus tenderness or frontal sinus tenderness.     Left Sinus: No maxillary sinus tenderness or frontal sinus tenderness.      Mouth/Throat:  Mouth: Mucous membranes are moist.     Dentition: Normal dentition. Does not have dentures. No dental tenderness, gingival swelling, dental caries, dental abscesses or gum lesions.     Pharynx: Oropharynx is clear. Uvula midline.     Tonsils: No tonsillar exudate.  Eyes:     Extraocular Movements: Extraocular movements intact.     Conjunctiva/sclera: Conjunctivae normal.     Pupils: Pupils are equal, round, and reactive to light.     Comments: No traumatic hyphema, Full ROM without difficulty   Neck:     Trachea: Trachea and phonation normal.     Comments: No midline tenderness Cardiovascular:     Rate and Rhythm: Normal rate.     Pulses: Normal pulses.          Radial pulses are 2+ on the right side and 2+ on the left side.     Heart sounds: Normal heart sounds.  Pulmonary:     Effort: Pulmonary effort is normal. No respiratory distress.     Breath sounds: Normal breath sounds and air entry.     Comments: [place], speaks in full sentences without difficulty Chest:     Comments: Nontender anterior posterior chest wall.  No overlying crepitus,  tenderness or step-off Abdominal:     General: Bowel sounds are normal. There is no distension.     Palpations: Abdomen is soft.     Tenderness: There is no abdominal tenderness.     Comments: Soft, nontender without rebound or guarding, no obvious traumatic injury  Musculoskeletal:        General: Normal range of motion.     Cervical back: Normal, full passive range of motion without pain and normal range of motion. No signs of trauma. No pain with movement, spinous process tenderness or muscular tenderness.     Thoracic back: Normal.     Lumbar back: Normal.     Right hip: Normal.     Left hip: Normal.     Right upper leg: Normal.     Left upper leg: Normal.     Right knee: Normal.     Left knee: Swelling present. Normal range of motion. Tenderness present.       Legs:     Comments: No bony tenderness bilateral upper extremity.  Pelvis stable, nontender to palpation.  No shortening or rotation of legs.  Tenderness right anterior knee with overlying abrasion however able to flex and extend without difficulty.  Nontender bilateral femur, tib-fib.  No midline C/T/L tenderness.  Skin:    General: Skin is warm and dry.     Capillary Refill: Capillary refill takes less than 2 seconds.     Comments: 1 cm laceration bridge of nose, 1 cm rounded skin tear left anterior knee  Neurological:     General: No focal deficit present.     Mental Status: She is alert.     Comments: Cranial nerves II through XII grossly intact Ambulatory without difficulty Intact sensation Equal strength bilaterally  Psychiatric:        Mood and Affect: Mood normal.   ED Results / Procedures / Treatments   Labs (all labs ordered are listed, but only abnormal results are displayed) Labs Reviewed - No data to display  EKG None  Radiology CT Head Wo Contrast  Result Date: 02/21/2021 CLINICAL DATA:  Mechanical fall while walking EXAM: CT HEAD WITHOUT CONTRAST CT MAXILLOFACIAL WITHOUT CONTRAST CT CERVICAL  SPINE WITHOUT CONTRAST TECHNIQUE: Multidetector CT imaging of the head,  cervical spine, and maxillofacial structures were performed using the standard protocol without intravenous contrast. Multiplanar CT image reconstructions of the cervical spine and maxillofacial structures were also generated. COMPARISON:  None. FINDINGS: CT HEAD FINDINGS Brain: No evidence of acute infarction, hemorrhage, hydrocephalus, extra-axial collection or mass lesion/mass effect. Periventricular white matter changes, likely the sequela of chronic small vessel ischemic disease. Vascular: No hyperdense vessel or unexpected calcification. Skull: Normal. Negative for fracture or focal lesion. Other: None CT MAXILLOFACIAL FINDINGS Osseous: Mildly displaced left nasal bone fracture (series 4, image 56). Possible additional nasal bridge fractures (series 4, image 59). No other acute osseous abnormality. No mandibular dislocation or destructive osseous process. Orbits: Negative. No traumatic or inflammatory finding. Sinuses: Clear. Osseous thickening in the left-greater-than-right posterior maxillary wall, suggestive of prior chronic sinusitis. Soft tissues: Laceration to the right-greater-than-left aspect of the nose. CT CERVICAL SPINE FINDINGS Alignment: Normal. Skull base and vertebrae: No acute fracture. No primary bone lesion or focal pathologic process. Soft tissues and spinal canal: No prevertebral fluid or swelling. No visible canal hematoma. Enlargement of the left-greater-than-right lobe of the thyroid, which are somewhat heterogeneous. No definite large hypoenhancing nodule. Disc levels: Multilevel degenerative changes, with mild-to-moderate spinal canal stenosis at C5-C6. Uncovertebral and facet arthropathy which causes neural foraminal narrowing, which is severe at C5-C6. Upper chest: No acute abnormality. Other: None. IMPRESSION: 1. Mildly displaced left nasal bone fracture, with additional possible nasal bridge fractures and  overlying soft tissue laceration. 2. No acute intracranial process. 3.  No acute fracture or traumatic listhesis in the cervical spine. 4. Enlargement of the left-greater-than-right lobe of the thyroid without definite large hypoenhancing nodule. Electronically Signed   By: Merilyn Baba M.D.   On: 02/21/2021 14:05   CT Cervical Spine Wo Contrast  Result Date: 02/21/2021 CLINICAL DATA:  Mechanical fall while walking EXAM: CT HEAD WITHOUT CONTRAST CT MAXILLOFACIAL WITHOUT CONTRAST CT CERVICAL SPINE WITHOUT CONTRAST TECHNIQUE: Multidetector CT imaging of the head, cervical spine, and maxillofacial structures were performed using the standard protocol without intravenous contrast. Multiplanar CT image reconstructions of the cervical spine and maxillofacial structures were also generated. COMPARISON:  None. FINDINGS: CT HEAD FINDINGS Brain: No evidence of acute infarction, hemorrhage, hydrocephalus, extra-axial collection or mass lesion/mass effect. Periventricular white matter changes, likely the sequela of chronic small vessel ischemic disease. Vascular: No hyperdense vessel or unexpected calcification. Skull: Normal. Negative for fracture or focal lesion. Other: None CT MAXILLOFACIAL FINDINGS Osseous: Mildly displaced left nasal bone fracture (series 4, image 56). Possible additional nasal bridge fractures (series 4, image 59). No other acute osseous abnormality. No mandibular dislocation or destructive osseous process. Orbits: Negative. No traumatic or inflammatory finding. Sinuses: Clear. Osseous thickening in the left-greater-than-right posterior maxillary wall, suggestive of prior chronic sinusitis. Soft tissues: Laceration to the right-greater-than-left aspect of the nose. CT CERVICAL SPINE FINDINGS Alignment: Normal. Skull base and vertebrae: No acute fracture. No primary bone lesion or focal pathologic process. Soft tissues and spinal canal: No prevertebral fluid or swelling. No visible canal hematoma.  Enlargement of the left-greater-than-right lobe of the thyroid, which are somewhat heterogeneous. No definite large hypoenhancing nodule. Disc levels: Multilevel degenerative changes, with mild-to-moderate spinal canal stenosis at C5-C6. Uncovertebral and facet arthropathy which causes neural foraminal narrowing, which is severe at C5-C6. Upper chest: No acute abnormality. Other: None. IMPRESSION: 1. Mildly displaced left nasal bone fracture, with additional possible nasal bridge fractures and overlying soft tissue laceration. 2. No acute intracranial process. 3.  No acute fracture or traumatic listhesis in  the cervical spine. 4. Enlargement of the left-greater-than-right lobe of the thyroid without definite large hypoenhancing nodule. Electronically Signed   By: Merilyn Baba M.D.   On: 02/21/2021 14:05   DG Knee Complete 4 Views Left  Result Date: 02/21/2021 CLINICAL DATA:  Status post fall.  Laceration. EXAM: LEFT KNEE - COMPLETE 4+ VIEW COMPARISON:  None. FINDINGS: No evidence of fracture, dislocation, or joint effusion. No evidence of arthropathy or other focal bone abnormality. Soft tissues are unremarkable. IMPRESSION: Negative. Electronically Signed   By: Kerby Moors M.D.   On: 02/21/2021 13:26   CT Maxillofacial Wo Contrast  Result Date: 02/21/2021 CLINICAL DATA:  Mechanical fall while walking EXAM: CT HEAD WITHOUT CONTRAST CT MAXILLOFACIAL WITHOUT CONTRAST CT CERVICAL SPINE WITHOUT CONTRAST TECHNIQUE: Multidetector CT imaging of the head, cervical spine, and maxillofacial structures were performed using the standard protocol without intravenous contrast. Multiplanar CT image reconstructions of the cervical spine and maxillofacial structures were also generated. COMPARISON:  None. FINDINGS: CT HEAD FINDINGS Brain: No evidence of acute infarction, hemorrhage, hydrocephalus, extra-axial collection or mass lesion/mass effect. Periventricular white matter changes, likely the sequela of chronic  small vessel ischemic disease. Vascular: No hyperdense vessel or unexpected calcification. Skull: Normal. Negative for fracture or focal lesion. Other: None CT MAXILLOFACIAL FINDINGS Osseous: Mildly displaced left nasal bone fracture (series 4, image 56). Possible additional nasal bridge fractures (series 4, image 59). No other acute osseous abnormality. No mandibular dislocation or destructive osseous process. Orbits: Negative. No traumatic or inflammatory finding. Sinuses: Clear. Osseous thickening in the left-greater-than-right posterior maxillary wall, suggestive of prior chronic sinusitis. Soft tissues: Laceration to the right-greater-than-left aspect of the nose. CT CERVICAL SPINE FINDINGS Alignment: Normal. Skull base and vertebrae: No acute fracture. No primary bone lesion or focal pathologic process. Soft tissues and spinal canal: No prevertebral fluid or swelling. No visible canal hematoma. Enlargement of the left-greater-than-right lobe of the thyroid, which are somewhat heterogeneous. No definite large hypoenhancing nodule. Disc levels: Multilevel degenerative changes, with mild-to-moderate spinal canal stenosis at C5-C6. Uncovertebral and facet arthropathy which causes neural foraminal narrowing, which is severe at C5-C6. Upper chest: No acute abnormality. Other: None. IMPRESSION: 1. Mildly displaced left nasal bone fracture, with additional possible nasal bridge fractures and overlying soft tissue laceration. 2. No acute intracranial process. 3.  No acute fracture or traumatic listhesis in the cervical spine. 4. Enlargement of the left-greater-than-right lobe of the thyroid without definite large hypoenhancing nodule. Electronically Signed   By: Merilyn Baba M.D.   On: 02/21/2021 14:05    Procedures .Marland KitchenLaceration Repair  Date/Time: 02/21/2021 2:57 PM Performed by: Nettie Elm, PA-C Authorized by: Nettie Elm, PA-C   Consent:    Consent obtained:  Verbal   Consent given by:   Patient   Risks discussed:  Infection, need for additional repair, pain, poor cosmetic result and poor wound healing   Alternatives discussed:  No treatment and delayed treatment Universal protocol:    Procedure explained and questions answered to patient or proxy's satisfaction: yes     Relevant documents present and verified: yes     Test results available: yes     Imaging studies available: yes     Required blood products, implants, devices, and special equipment available: yes     Site/side marked: yes     Immediately prior to procedure, a time out was called: yes     Patient identity confirmed:  Verbally with patient Anesthesia:    Anesthesia method:  Local infiltration and  topical application   Topical anesthetic:  LET Laceration details:    Location:  Face   Face location:  Nose   Length (cm):  1.3   Depth (mm):  3 Pre-procedure details:    Preparation:  Patient was prepped and draped in usual sterile fashion and imaging obtained to evaluate for foreign bodies Exploration:    Limited defect created (wound extended): no     Hemostasis achieved with:  Direct pressure   Imaging obtained comment:  CT head/max/face   Imaging outcome: foreign body not noted     Wound exploration: wound explored through full range of motion and entire depth of wound visualized     Wound extent: no foreign bodies/material noted, no muscle damage noted, no tendon damage noted, no underlying fracture noted and no vascular damage noted     Contaminated: no   Treatment:    Area cleansed with:  Povidone-iodine   Amount of cleaning:  Extensive   Irrigation solution:  Sterile saline   Debridement:  None   Undermining:  None Skin repair:    Repair method:  Sutures   Suture size:  5-0   Suture material:  Prolene   Suture technique:  Simple interrupted   Number of sutures:  4 Approximation:    Approximation:  Close Repair type:    Repair type:  Complex Post-procedure details:    Dressing:   Non-adherent dressing   Procedure completion:  Tolerated well, no immediate complications   Medications Ordered in ED Medications  lidocaine (PF) (XYLOCAINE) 1 % injection 5 mL (5 mLs Infiltration Given by Other 02/21/21 1310)  Tdap (BOOSTRIX) injection 0.5 mL (0.5 mLs Intramuscular Given 02/21/21 1313)  lidocaine-EPINEPHrine-tetracaine (LET) topical gel (3 mLs Topical Given 02/21/21 1339)    ED Course  I have reviewed the triage vital signs and the nursing notes.  Pertinent labs & imaging results that were available during my care of the patient were reviewed by me and considered in my medical decision making (see chart for details).  76 year old here for evaluation of mechanical fall which occurred just PTA.  She is afebrile, nonseptic, not ill-appearing.  She has overlying laceration to her nasal bridge, suspect nasal fracture, reassuring no septal hematoma, epistaxis.  Some abrasion to forehead and chin.  No evidence of malocclusion to jaw.  She denies LOC or anticoagulation.  No midline C/T/L tenderness.  Ambulatory without difficulty.  Does have abrasion and swelling to her left knee however full range of motion, neurovascularly intact.  Imaging personally reviewed and interpreted:  X-ray left knee without any acute fracture CT head without significant abnormality  CT cervical without significant, does have enlarged thyroid, patient will follow-up with PCP for the CT max face with nasal fracture  Wound thoroughly irrigated, cleaned.  #4 sutures placed with laceration.  Will start on Keflex given has underlying nasal fracture.  We will have her follow-up outpatient.  She is agreeable.  The patient has been appropriately medically screened and/or stabilized in the ED. I have low suspicion for any other emergent medical condition which would require further screening, evaluation or treatment in the ED or require inpatient management.  Patient is hemodynamically stable and in no acute  distress.  Patient able to ambulate in department prior to ED.  Evaluation does not show acute pathology that would require ongoing or additional emergent interventions while in the emergency department or further inpatient treatment.  I have discussed the diagnosis with the patient and answered all questions.  Pain is  been managed while in the emergency department and patient has no further complaints prior to discharge.  Patient is comfortable with plan discussed in room and is stable for discharge at this time.  I have discussed strict return precautions for returning to the emergency department.  Patient was encouraged to follow-up with PCP/specialist refer to at discharge.      MDM Rules/Calculators/A&P                             Final Clinical Impression(s) / ED Diagnoses Final diagnoses:  Fall, initial encounter  Open fracture of nasal bone, initial encounter  Enlarged thyroid    Rx / DC Orders ED Discharge Orders          Ordered    cephALEXin (KEFLEX) 500 MG capsule  2 times daily        02/21/21 1509             Lucian Baswell A, PA-C 42/59/56 [id number]    Lianne Cure, DO 64/33/29 [id number]

## 2021-02-21 NOTE — ED Notes (Signed)
RN provided AVS using Teachback Method. Patient verbalizes understanding of Discharge Instructions. Opportunity for Questioning and Answers were provided by RN. Patient Discharged from ED.

## 2021-03-15 ENCOUNTER — Inpatient Hospital Stay: Payer: Medicare Other | Attending: Physician Assistant

## 2021-03-15 ENCOUNTER — Other Ambulatory Visit: Payer: Self-pay

## 2021-03-15 DIAGNOSIS — C3412 Malignant neoplasm of upper lobe, left bronchus or lung: Secondary | ICD-10-CM | POA: Diagnosis present

## 2021-03-15 DIAGNOSIS — I10 Essential (primary) hypertension: Secondary | ICD-10-CM | POA: Diagnosis not present

## 2021-03-15 DIAGNOSIS — R590 Localized enlarged lymph nodes: Secondary | ICD-10-CM | POA: Insufficient documentation

## 2021-03-15 DIAGNOSIS — C3490 Malignant neoplasm of unspecified part of unspecified bronchus or lung: Secondary | ICD-10-CM

## 2021-03-15 LAB — CBC WITH DIFFERENTIAL (CANCER CENTER ONLY)
Abs Immature Granulocytes: 0.03 10*3/uL (ref 0.00–0.07)
Basophils Absolute: 0 10*3/uL (ref 0.0–0.1)
Basophils Relative: 1 %
Eosinophils Absolute: 0.2 10*3/uL (ref 0.0–0.5)
Eosinophils Relative: 3 %
HCT: 39.1 % (ref 36.0–46.0)
Hemoglobin: 12.9 g/dL (ref 12.0–15.0)
Immature Granulocytes: 0 %
Lymphocytes Relative: 25 %
Lymphs Abs: 2 10*3/uL (ref 0.7–4.0)
MCH: 30.6 pg (ref 26.0–34.0)
MCHC: 33 g/dL (ref 30.0–36.0)
MCV: 92.7 fL (ref 80.0–100.0)
Monocytes Absolute: 0.6 10*3/uL (ref 0.1–1.0)
Monocytes Relative: 7 %
Neutro Abs: 5.1 10*3/uL (ref 1.7–7.7)
Neutrophils Relative %: 64 %
Platelet Count: 230 10*3/uL (ref 150–400)
RBC: 4.22 MIL/uL (ref 3.87–5.11)
RDW: 13.2 % (ref 11.5–15.5)
WBC Count: 8 10*3/uL (ref 4.0–10.5)
nRBC: 0 % (ref 0.0–0.2)

## 2021-03-15 LAB — CMP (CANCER CENTER ONLY)
ALT: 22 U/L (ref 0–44)
AST: 22 U/L (ref 15–41)
Albumin: 4.5 g/dL (ref 3.5–5.0)
Alkaline Phosphatase: 107 U/L (ref 38–126)
Anion gap: 9 (ref 5–15)
BUN: 22 mg/dL (ref 8–23)
CO2: 29 mmol/L (ref 22–32)
Calcium: 9.8 mg/dL (ref 8.9–10.3)
Chloride: 102 mmol/L (ref 98–111)
Creatinine: 1.14 mg/dL — ABNORMAL HIGH (ref 0.44–1.00)
GFR, Estimated: 51 mL/min — ABNORMAL LOW (ref >60)
Glucose, Bld: 105 mg/dL — ABNORMAL HIGH (ref 70–99)
Potassium: 4 mmol/L (ref 3.5–5.1)
Sodium: 140 mmol/L (ref 135–145)
Total Bilirubin: 0.4 mg/dL (ref 0.3–1.2)
Total Protein: 7.6 g/dL (ref 6.5–8.1)

## 2021-03-18 NOTE — Progress Notes (Signed)
[place] OFFICE PROGRESS NOTE  Beverley Fiedler, FNP [place] [id number]  DIAGNOSIS: Recurrent non-small cell lung cancer that was initially diagnosed as stage IB (T2 a, N0, M0) non-small cell lung cancer, adenocarcinoma diagnosed in September 202. She is status post left upper lobectomy with lymph node dissection with tumor size of 3.2 cm and negative lymphadenopathy at that time. She had disease recurrence in July 2022 with anterior mediastinal lymphadenopathy which was consistent with metastatic non-small cell lung cancer.    Biomarker Findings Tumor Mutational Burden - 21 Muts/Mb Microsatellite status - MS-Stable Genomic Findings For a complete list of the genes assayed, please refer to the Appendix. KRAS G12V TP53 F134L 7 Disease relevant genes with no reportable alterations: ALK, BRAF, EGFR, ERBB2, MET, RET, ROS1   PDL1: 0%  PRIOR THERAPY: 1)  Left upper lobectomy under the care of Dr. Kipp Brood in November 2020. 2) SBRT to the disease recurrence in the anterior mediastinal lymphadenopathy under the care of Dr. Lisbeth Renshaw. Last treatment 12/16/20.  CURRENT THERAPY: None  INTERVAL HISTORY: Basya Casavant 76 y.o. female returns to the clinic today for a follow-up visit.  To clinic today for a follow-up visit.  The patient was last seen in clinic on 11/17/2020.  At that point time, the patient was found to have suspicious recurrent disease.  She was initially diagnosed with stage Ib non-small cell lung cancer, adenocarcinoma in September 2022.  She had a left upper lobectomy at that time.  She showed evidence of disease recurrence in July 2022 with increased adenopathy in the mediastinum.  This was biopsied and proven to be recurrent non-small cell lung cancer.  She was seen on 11/17/2020 to discussed her options. The patient was reluctant to pursue any further surgical evaluation given her complications from her first surgery and opted for SBRT.  The  patient was then referred to radiation oncology and this was completed on SBRT.  Her last treatment was on 12/16/2020.  Overall, she is feeling fairly well today.  Denies any fever, chills, night sweats, or unexplained weight loss.  She denies any chest pain, shortness of breath, cough, or hemoptysis.  Denies any nausea, vomiting, diarrhea, or constipation.  Denies any headache or visual changes.  The patient recently had a restaging CT scan performed.  She is here today for evaluation and to review her scan results.   MEDICAL HISTORY: Past Medical History:  Diagnosis Date   Cancer ([place])    Lung cancer   History of chicken pox    Hyperlipidemia    Hypertension    Menopause    Osteoporosis    Pneumonia    "walking" pneumonia   Pre-diabetes    Vitamin D deficiency     ALLERGIES:  is allergic to aspirin and latex.  MEDICATIONS:  Current Outpatient Medications  Medication Sig Dispense Refill   atorvastatin (LIPITOR) 40 MG tablet Take 40 mg by mouth every evening.      diltiazem (DILACOR XR) 240 MG 24 hr capsule Take 240 mg by mouth daily.     guaiFENesin (MUCINEX) 600 MG 12 hr tablet Take 1 tablet (600 mg total) by mouth 2 (two) times daily. (Patient taking differently: Take 600 mg by mouth daily.)     metoprolol succinate (TOPROL-XL) 50 MG 24 hr tablet Take 50 mg by mouth daily.     Multiple Vitamin (MULTIVITAMIN WITH MINERALS) TABS tablet Take 1 tablet by mouth daily.     triamterene-hydrochlorothiazide (MAXZIDE-25) 37.5-25 MG tablet  Take 1 tablet by mouth daily.     No current facility-administered medications for this visit.   Facility-Administered Medications Ordered in Other Visits  Medication Dose Route Frequency Provider Last Rate Last Admin   sodium chloride (PF) 0.9 % injection             SURGICAL HISTORY:  Past Surgical History:  Procedure Laterality Date   CHEST TUBE INSERTION Left 01/19/2019   Procedure: Chest Tube Insertion;  Surgeon: Lajuana Matte, MD;   Location: [facility];  Service: Thoracic;  Laterality: Left;   PLEURADESIS N/A 01/06/2019   Procedure: Pleuradesis - Chemical;  Surgeon: Lajuana Matte, MD;  Location: [place];  Service: Thoracic;  Laterality: N/A;   PLEURADESIS Left 01/19/2019   Procedure: Mechanical Pleuradesis;  Surgeon: Lajuana Matte, MD;  Location: [facility];  Service: Thoracic;  Laterality: Left;   VIDEO ASSISTED THORACOSCOPY Left 01/19/2019   Procedure: VIDEO ASSISTED THORACOSCOPY;  Surgeon: Lajuana Matte, MD;  Location: [employer] [facility];  Service: Thoracic;  Laterality: Left;   VIDEO ASSISTED THORACOSCOPY (VATS)/ LOBECTOMY Left 12/29/2018   Procedure: VIDEO ASSISTED THORACOSCOPY (VATS)/LEFT UPPER  LOBECTOMY with Mediastinal lymph node exploration.;  Surgeon: Lajuana Matte, MD;  Location: [place];  Service: Thoracic;  Laterality: Left;   VIDEO BRONCHOSCOPY N/A 12/29/2018   Procedure: VIDEO BRONCHOSCOPY;  Surgeon: Lajuana Matte, MD;  Location: [place];  Service: Thoracic;  Laterality: N/A;   [place] N/A 12/09/2018   Procedure: VIDEO BRONCHOSCOPY WITH ENDOBRONCHIAL NAVIGATION;  Surgeon: Lajuana Matte, MD;  Location: [place];  Service: Thoracic;  Laterality: N/A;   VIDEO BRONCHOSCOPY WITH INSERTION OF INTERBRONCHIAL VALVE (IBV) N/A 01/06/2019   Procedure: VIDEO BRONCHOSCOPY WITH INSERTION OF INTERBRONCHIAL VALVE (IBV);  Surgeon: Lajuana Matte, MD;  Location: [place];  Service: Thoracic;  Laterality: N/A;   VIDEO BRONCHOSCOPY WITH INSERTION OF INTERBRONCHIAL VALVE (IBV) N/A 01/13/2019   Procedure: VIDEO BRONCHOSCOPY WITH INSERTION OF INTERBRONCHIAL VALVE (IBV);  Surgeon: Lajuana Matte, MD;  Location: [facility];  Service: Thoracic;  Laterality: N/A;   VIDEO BRONCHOSCOPY WITH INSERTION OF INTERBRONCHIAL VALVE (IBV) N/A 02/23/2019   Procedure: VIDEO BRONCHOSCOPY WITH REMOVAL OF INTERBRONCHIAL VALVE (IBV);  Surgeon: Lajuana Matte, MD;  Location: [facility] OR;  Service:  Thoracic;  Laterality: N/A;    REVIEW OF SYSTEMS:   Review of Systems  Constitutional: Negative for appetite change, chills, fatigue, fever and unexpected weight change.  HENT:   Negative for mouth sores, nosebleeds, sore throat and trouble swallowing.   Eyes: Negative for eye problems and icterus.  Respiratory: Negative for cough, hemoptysis, shortness of breath and wheezing.   Cardiovascular: Negative for chest pain and leg swelling.  Gastrointestinal: Negative for abdominal pain, constipation, diarrhea, nausea and vomiting.  Genitourinary: Negative for bladder incontinence, difficulty urinating, dysuria, frequency and hematuria.   Musculoskeletal: Negative for back pain, gait problem, neck pain and neck stiffness.  Skin: Negative for itching and rash.  Neurological: Negative for dizziness, extremity weakness, gait problem, headaches, light-headedness and seizures.  Hematological: Negative for adenopathy. Does not bruise/bleed easily.  Psychiatric/Behavioral: Negative for confusion, depression and sleep disturbance. The patient is not nervous/anxious.     PHYSICAL EXAMINATION:  Blood pressure 124/61, pulse 75, temperature (!) 97.3 F (36.3 C), temperature source Temporal, resp. rate 18, weight 168 lb 9 oz (76.5 kg), SpO2 99 %.  ECOG PERFORMANCE STATUS: 0-1  Physical Exam  Constitutional: Oriented to person, place, and time and well-developed, well-nourished, and in no distress.  HENT:  Head: Normocephalic and atraumatic.  Mouth/Throat: Oropharynx is clear and moist. No oropharyngeal exudate.  Eyes: Conjunctivae are normal. Right eye exhibits no discharge. Left eye exhibits no discharge. No scleral icterus.  Neck: Normal range of motion. Neck supple.  Cardiovascular: Normal rate, regular rhythm, normal heart sounds and intact distal pulses.   Pulmonary/Chest: Effort normal and breath sounds normal. No respiratory distress. No wheezes. No rales.  Abdominal: Soft. Bowel sounds are  normal. Exhibits no distension and no mass. There is no tenderness.  Musculoskeletal: Normal range of motion. Exhibits no edema.  Lymphadenopathy:    No cervical adenopathy.  Neurological: Alert and oriented to person, place, and time. Exhibits normal muscle tone. Gait normal. Coordination normal.  Skin: Skin is warm and dry. No rash noted. Not diaphoretic. No erythema. No pallor.  Psychiatric: Mood, memory and judgment normal.  Vitals reviewed.  LABORATORY DATA: Lab Results  Component Value Date   WBC 8.0 03/15/2021   HGB 12.9 03/15/2021   HCT 39.1 03/15/2021   MCV 92.7 03/15/2021   PLT 230 03/15/2021      Chemistry      Component Value Date/Time   NA 140 03/15/2021 0909   K 4.0 03/15/2021 0909   CL 102 03/15/2021 0909   CO2 29 03/15/2021 0909   BUN 22 03/15/2021 0909   CREATININE 1.14 (H) 03/15/2021 0909   GLU 845 10/28/2009 0000      Component Value Date/Time   CALCIUM 9.8 03/15/2021 0909   ALKPHOS 107 03/15/2021 0909   AST 22 03/15/2021 0909   ALT 22 03/15/2021 0909   BILITOT 0.4 03/15/2021 0909       RADIOGRAPHIC STUDIES:  CT Head Wo Contrast  Result Date: 02/21/2021 CLINICAL DATA:  Mechanical fall while walking EXAM: CT HEAD WITHOUT CONTRAST CT MAXILLOFACIAL WITHOUT CONTRAST CT CERVICAL SPINE WITHOUT CONTRAST TECHNIQUE: Multidetector CT imaging of the head, cervical spine, and maxillofacial structures were performed using the standard protocol without intravenous contrast. Multiplanar CT image reconstructions of the cervical spine and maxillofacial structures were also generated. COMPARISON:  None. FINDINGS: CT HEAD FINDINGS Brain: No evidence of acute infarction, hemorrhage, hydrocephalus, extra-axial collection or mass lesion/mass effect. Periventricular white matter changes, likely the sequela of chronic small vessel ischemic disease. Vascular: No hyperdense vessel or unexpected calcification. Skull: Normal. Negative for fracture or focal lesion. Other: None CT  MAXILLOFACIAL FINDINGS Osseous: Mildly displaced left nasal bone fracture (series 4, image 56). Possible additional nasal bridge fractures (series 4, image 59). No other acute osseous abnormality. No mandibular dislocation or destructive osseous process. Orbits: Negative. No traumatic or inflammatory finding. Sinuses: Clear. Osseous thickening in the left-greater-than-right posterior maxillary wall, suggestive of prior chronic sinusitis. Soft tissues: Laceration to the right-greater-than-left aspect of the nose. CT CERVICAL SPINE FINDINGS Alignment: Normal. Skull base and vertebrae: No acute fracture. No primary bone lesion or focal pathologic process. Soft tissues and spinal canal: No prevertebral fluid or swelling. No visible canal hematoma. Enlargement of the left-greater-than-right lobe of the thyroid, which are somewhat heterogeneous. No definite large hypoenhancing nodule. Disc levels: Multilevel degenerative changes, with mild-to-moderate spinal canal stenosis at C5-C6. Uncovertebral and facet arthropathy which causes neural foraminal narrowing, which is severe at C5-C6. Upper chest: No acute abnormality. Other: None. IMPRESSION: 1. Mildly displaced left nasal bone fracture, with additional possible nasal bridge fractures and overlying soft tissue laceration. 2. No acute intracranial process. 3.  No acute fracture or traumatic listhesis in the cervical spine. 4. Enlargement of the left-greater-than-right lobe of the  thyroid without definite large hypoenhancing nodule. Electronically Signed   By: Merilyn Baba M.D.   On: 02/21/2021 14:05   CT Chest W Contrast  Result Date: 03/20/2021 CLINICAL DATA:  Non-small-cell lung cancer. Monitor. Diagnosed in 2021. Radiation therapy and left lung surgery. EXAM: CT CHEST WITH CONTRAST TECHNIQUE: Multidetector CT imaging of the chest was performed during intravenous contrast administration. RADIATION DOSE REDUCTION: This exam was performed according to the departmental  dose-optimization program which includes automated exposure control, adjustment of the mA and/or kV according to patient size and/or use of iterative reconstruction technique. CONTRAST:  39m OMNIPAQUE IOHEXOL 300 MG/ML  SOLN COMPARISON:  Chest two views 03/20/2019 CT chest 09/23/2020 FINDINGS: Cardiovascular: Heart size is normal. No pericardial effusion. No thoracic aortic aneurysm. Mild-to-moderate atherosclerotic calcifications within the thoracic aorta. No large central pulmonary embolism. Mediastinum/Nodes: No axillary lymphadenopathy. Stable to decreased size of 8 x 7 mm prevascular lymph node (axial series 2, image 58). Resolution of the prior more inferior prevascular lymph nodes previously measuring up to 1.1 cm in short axis on 09/23/2020, anterolateral to the aortic arch. No hilar lymphadenopathy. The left thyroid lobe is again mildly enlarged and mildly heterogeneous. The esophagus follows a normal course of normal caliber. Lungs/Pleura: The central airways are patent. Mild chronic pleural thickening/scarring within the bilateral lung apices. Mild-to-moderate centrilobular emphysematous changes are again seen, greatest superiorly. Postsurgical changes of left upper lobectomy are again seen. Surgical suture at the anterior superior left upper lung with adjacent mild curvilinear scarring is unchanged. Curvilinear scarring within the inferior lingula is also unchanged. 3 mm left lower lobe pulmonary nodule (axial series 10, image 67) is unchanged from 09/23/2020. No significant change in mild thickening of the right major fissure (e.g. Axial series 10, image 104 sagittal image 52), likely a benign intrafissural lymph node. No new or suspicious pulmonary nodule is seen. No pleural effusion or pneumothorax. Upper Abdomen: Unchanged nodular thickening of the left adrenal gland, previously with low-density on noncontrast imaging suggesting a benign lipid rich adenoma. Moderate C6-7 disc space narrowing and  endplate osteophytosis. Moderate multilevel degenerative disc and endplate changes throughout the midthoracic spine similar to prior. No aggressive bone lesion is identified. Musculoskeletal: Mild multilevel degenerative disc changes of the visualized thoracic spine. IMPRESSION: Compared to 09/23/2020: 1. Postsurgical changes of left upper lobectomy. No evidence of local recurrence or metastatic disease. 2. Interval decrease in size of a left superior prevascular lymph node. Resolution of the previously seen enlarged more inferior prevascular lymph nodes anterior lateral to the aortic arch. Aortic Atherosclerosis (ICD10-I70.0) and Emphysema (ICD10-J43.9). Electronically Signed   By: RYvonne KendallM.D.   On: 03/20/2021 08:39   CT Cervical Spine Wo Contrast  Result Date: 02/21/2021 CLINICAL DATA:  Mechanical fall while walking EXAM: CT HEAD WITHOUT CONTRAST CT MAXILLOFACIAL WITHOUT CONTRAST CT CERVICAL SPINE WITHOUT CONTRAST TECHNIQUE: Multidetector CT imaging of the head, cervical spine, and maxillofacial structures were performed using the standard protocol without intravenous contrast. Multiplanar CT image reconstructions of the cervical spine and maxillofacial structures were also generated. COMPARISON:  None. FINDINGS: CT HEAD FINDINGS Brain: No evidence of acute infarction, hemorrhage, hydrocephalus, extra-axial collection or mass lesion/mass effect. Periventricular white matter changes, likely the sequela of chronic small vessel ischemic disease. Vascular: No hyperdense vessel or unexpected calcification. Skull: Normal. Negative for fracture or focal lesion. Other: None CT MAXILLOFACIAL FINDINGS Osseous: Mildly displaced left nasal bone fracture (series 4, image 56). Possible additional nasal bridge fractures (series 4, image 59). No other acute osseous abnormality.  No mandibular dislocation or destructive osseous process. Orbits: Negative. No traumatic or inflammatory finding. Sinuses: Clear. Osseous  thickening in the left-greater-than-right posterior maxillary wall, suggestive of prior chronic sinusitis. Soft tissues: Laceration to the right-greater-than-left aspect of the nose. CT CERVICAL SPINE FINDINGS Alignment: Normal. Skull base and vertebrae: No acute fracture. No primary bone lesion or focal pathologic process. Soft tissues and spinal canal: No prevertebral fluid or swelling. No visible canal hematoma. Enlargement of the left-greater-than-right lobe of the thyroid, which are somewhat heterogeneous. No definite large hypoenhancing nodule. Disc levels: Multilevel degenerative changes, with mild-to-moderate spinal canal stenosis at C5-C6. Uncovertebral and facet arthropathy which causes neural foraminal narrowing, which is severe at C5-C6. Upper chest: No acute abnormality. Other: None. IMPRESSION: 1. Mildly displaced left nasal bone fracture, with additional possible nasal bridge fractures and overlying soft tissue laceration. 2. No acute intracranial process. 3.  No acute fracture or traumatic listhesis in the cervical spine. 4. Enlargement of the left-greater-than-right lobe of the thyroid without definite large hypoenhancing nodule. Electronically Signed   By: Merilyn Baba M.D.   On: 02/21/2021 14:05   DG Knee Complete 4 Views Left  Result Date: 02/21/2021 CLINICAL DATA:  Status post fall.  Laceration. EXAM: LEFT KNEE - COMPLETE 4+ VIEW COMPARISON:  None. FINDINGS: No evidence of fracture, dislocation, or joint effusion. No evidence of arthropathy or other focal bone abnormality. Soft tissues are unremarkable. IMPRESSION: Negative. Electronically Signed   By: Kerby Moors M.D.   On: 02/21/2021 13:26   CT Maxillofacial Wo Contrast  Result Date: 02/21/2021 CLINICAL DATA:  Mechanical fall while walking EXAM: CT HEAD WITHOUT CONTRAST CT MAXILLOFACIAL WITHOUT CONTRAST CT CERVICAL SPINE WITHOUT CONTRAST TECHNIQUE: Multidetector CT imaging of the head, cervical spine, and maxillofacial structures  were performed using the standard protocol without intravenous contrast. Multiplanar CT image reconstructions of the cervical spine and maxillofacial structures were also generated. COMPARISON:  None. FINDINGS: CT HEAD FINDINGS Brain: No evidence of acute infarction, hemorrhage, hydrocephalus, extra-axial collection or mass lesion/mass effect. Periventricular white matter changes, likely the sequela of chronic small vessel ischemic disease. Vascular: No hyperdense vessel or unexpected calcification. Skull: Normal. Negative for fracture or focal lesion. Other: None CT MAXILLOFACIAL FINDINGS Osseous: Mildly displaced left nasal bone fracture (series 4, image 56). Possible additional nasal bridge fractures (series 4, image 59). No other acute osseous abnormality. No mandibular dislocation or destructive osseous process. Orbits: Negative. No traumatic or inflammatory finding. Sinuses: Clear. Osseous thickening in the left-greater-than-right posterior maxillary wall, suggestive of prior chronic sinusitis. Soft tissues: Laceration to the right-greater-than-left aspect of the nose. CT CERVICAL SPINE FINDINGS Alignment: Normal. Skull base and vertebrae: No acute fracture. No primary bone lesion or focal pathologic process. Soft tissues and spinal canal: No prevertebral fluid or swelling. No visible canal hematoma. Enlargement of the left-greater-than-right lobe of the thyroid, which are somewhat heterogeneous. No definite large hypoenhancing nodule. Disc levels: Multilevel degenerative changes, with mild-to-moderate spinal canal stenosis at C5-C6. Uncovertebral and facet arthropathy which causes neural foraminal narrowing, which is severe at C5-C6. Upper chest: No acute abnormality. Other: None. IMPRESSION: 1. Mildly displaced left nasal bone fracture, with additional possible nasal bridge fractures and overlying soft tissue laceration. 2. No acute intracranial process. 3.  No acute fracture or traumatic listhesis in the  cervical spine. 4. Enlargement of the left-greater-than-right lobe of the thyroid without definite large hypoenhancing nodule. Electronically Signed   By: Merilyn Baba M.D.   On: 02/21/2021 14:05     ASSESSMENT/PLAN:  This is  a very pleasant 76 year old Caucasian female with recurrent non-small cell lung cancer that was initially diagnosed with stage Ib (T2 a, N0, M0) non-small cell lung cancer, adenocarcinoma.  She was initially diagnosed in September of 2020.  She was initially diagnosed with a left upper lobe lesion status post left upper lobectomy lymph node dissection with a tumor size of 3.2 and negative lymphadenopathy at that time.  The patient and September 2022 was found to have disease recurrence with biopsy-proven metastatic non-small cell lung cancer in the mediastinal lymph node.  PD-L1 expression is 0%.  She is negative for any actionable mutations.   She completed SBRT to the disease recurrence on 12/16/20 under the care of Dr. Lisbeth Renshaw.   The patient recently had a restaging CT scan performed.  Dr. [name] personally and independently reviewed the scan and discussed the results with the patient. The scan showed no evidence for disease progression . Labs were reviewed. Recommend that she continue on observation with a restaging CT scan of the chest in 6 months.   I will arrange for a restaging CT scan in 6 months for evaluation and to review her scan result.   The patient was advised to call immediately if she has any concerning symptoms in the interval. The patient voices understanding of current disease status and treatment options and is in agreement with the current care plan. All questions were answered. The patient knows to call the clinic with any problems, questions or concerns. We can certainly see the patient much sooner if necessary Orders Placed This Encounter  Procedures   CT Chest W Contrast    Standing Status:   Future    Standing Expiration Date:   03/20/2022    Order  Specific Question:   If indicated for the ordered procedure, I authorize the administration of contrast media per Radiology protocol    Answer:   Yes    Order Specific Question:   Preferred imaging location?    Answer:   [facility]   CBC with Differential ([place] Only)    Standing Status:   Future    Standing Expiration Date:   03/20/2022   CMP ([place] only)    Standing Status:   Future    Standing Expiration Date:   03/20/2022      Tobe Sos Teighan Aubert, PA-C 03/20/21  ADDENDUM: Hematology/Oncology Attending: I had a face-to-face encounter with the patient today.  I reviewed her record, lab, scan and recommended her care plan.  This is a very pleasant 76 years old white female with recurrent non-small cell lung cancer initially diagnosed as a stage Ib (T2 a, N0, M0) non-small cell lung cancer, adenocarcinoma in September 2020 status post left upper lobectomy with lymph node dissection.  The patient had evidence for disease recurrence in September 2022 presented with mediastinal lymph node status post curative radiotherapy. She had repeat CT scan of the chest performed recently.  I personally and independently reviewed the scan and discussed the result with the patient today.  Her scan showed no evidence for disease progression and there was some improvement in the treated mediastinal lymph node with radiation. I recommended for her to continue on observation with repeat CT scan of the chest in 6 months.  She will come back for follow-up visit at that time. The patient was advised to call immediately if she has any other concerning symptoms in the interval. Disclaimer: This note was dictated with voice recognition software. Similar sounding words can inadvertently  be transcribed and may be missed upon review. Eilleen Kempf, MD 03/20/21

## 2021-03-20 ENCOUNTER — Other Ambulatory Visit: Payer: Self-pay

## 2021-03-20 ENCOUNTER — Inpatient Hospital Stay (HOSPITAL_BASED_OUTPATIENT_CLINIC_OR_DEPARTMENT_OTHER): Payer: Medicare Other | Admitting: Physician Assistant

## 2021-03-20 ENCOUNTER — Encounter: Payer: Self-pay | Admitting: Physician Assistant

## 2021-03-20 ENCOUNTER — Encounter (HOSPITAL_COMMUNITY): Payer: Self-pay

## 2021-03-20 ENCOUNTER — Ambulatory Visit (HOSPITAL_COMMUNITY)
Admission: RE | Admit: 2021-03-20 | Discharge: 2021-03-20 | Disposition: A | Discharge status: Home / Self Care | Payer: Medicare Other | Source: Ambulatory Visit | Attending: Physician Assistant | Admitting: Physician Assistant

## 2021-03-20 VITALS — BP 124/61 | HR 75 | Temp 97.3°F | Resp 18 | Wt 168.6 lb

## 2021-03-20 DIAGNOSIS — C3490 Malignant neoplasm of unspecified part of unspecified bronchus or lung: Secondary | ICD-10-CM

## 2021-03-20 DIAGNOSIS — C3412 Malignant neoplasm of upper lobe, left bronchus or lung: Secondary | ICD-10-CM | POA: Diagnosis not present

## 2021-03-20 MED ORDER — SODIUM CHLORIDE (PF) 0.9 % IJ SOLN
INTRAMUSCULAR | Status: AC
  Filled 2021-03-20: qty 50

## 2021-03-20 MED ORDER — IOHEXOL 300 MG/ML  SOLN
75.0000 mL | Freq: Once | INTRAMUSCULAR | Status: AC | PRN
  Administered 2021-03-20: 75 mL via INTRAVENOUS

## 2021-09-18 ENCOUNTER — Other Ambulatory Visit: Payer: Self-pay

## 2021-09-18 ENCOUNTER — Inpatient Hospital Stay: Payer: Medicare Other | Attending: Internal Medicine

## 2021-09-18 ENCOUNTER — Ambulatory Visit (HOSPITAL_COMMUNITY)
Admission: RE | Admit: 2021-09-18 | Discharge: 2021-09-18 | Disposition: A | Discharge status: Home / Self Care | Payer: Medicare Other | Source: Ambulatory Visit | Attending: Physician Assistant | Admitting: Physician Assistant

## 2021-09-18 DIAGNOSIS — C3490 Malignant neoplasm of unspecified part of unspecified bronchus or lung: Secondary | ICD-10-CM

## 2021-09-18 DIAGNOSIS — Z923 Personal history of irradiation: Secondary | ICD-10-CM | POA: Insufficient documentation

## 2021-09-18 DIAGNOSIS — Z902 Acquired absence of lung [part of]: Secondary | ICD-10-CM | POA: Insufficient documentation

## 2021-09-18 DIAGNOSIS — Z85118 Personal history of other malignant neoplasm of bronchus and lung: Secondary | ICD-10-CM | POA: Insufficient documentation

## 2021-09-18 LAB — CBC WITH DIFFERENTIAL (CANCER CENTER ONLY)
Abs Immature Granulocytes: 0.03 10*3/uL (ref 0.00–0.07)
Basophils Absolute: 0.1 10*3/uL (ref 0.0–0.1)
Basophils Relative: 1 %
Eosinophils Absolute: 0.1 10*3/uL (ref 0.0–0.5)
Eosinophils Relative: 1 %
HCT: 37.9 % (ref 36.0–46.0)
Hemoglobin: 13 g/dL (ref 12.0–15.0)
Immature Granulocytes: 0 %
Lymphocytes Relative: 19 %
Lymphs Abs: 1.8 10*3/uL (ref 0.7–4.0)
MCH: 31.3 pg (ref 26.0–34.0)
MCHC: 34.3 g/dL (ref 30.0–36.0)
MCV: 91.3 fL (ref 80.0–100.0)
Monocytes Absolute: 0.6 10*3/uL (ref 0.1–1.0)
Monocytes Relative: 6 %
Neutro Abs: 6.8 10*3/uL (ref 1.7–7.7)
Neutrophils Relative %: 73 %
Platelet Count: 246 10*3/uL (ref 150–400)
RBC: 4.15 MIL/uL (ref 3.87–5.11)
RDW: 13 % (ref 11.5–15.5)
WBC Count: 9.3 10*3/uL (ref 4.0–10.5)
nRBC: 0 % (ref 0.0–0.2)

## 2021-09-18 LAB — CMP (CANCER CENTER ONLY)
ALT: 18 U/L (ref 0–44)
AST: 21 U/L (ref 15–41)
Albumin: 4.6 g/dL (ref 3.5–5.0)
Alkaline Phosphatase: 114 U/L (ref 38–126)
Anion gap: 9 (ref 5–15)
BUN: 16 mg/dL (ref 8–23)
CO2: 29 mmol/L (ref 22–32)
Calcium: 9.4 mg/dL (ref 8.9–10.3)
Chloride: 100 mmol/L (ref 98–111)
Creatinine: 1.34 mg/dL — ABNORMAL HIGH (ref 0.44–1.00)
GFR, Estimated: 42 mL/min — ABNORMAL LOW (ref >60)
Glucose, Bld: 118 mg/dL — ABNORMAL HIGH (ref 70–99)
Potassium: 3.9 mmol/L (ref 3.5–5.1)
Sodium: 138 mmol/L (ref 135–145)
Total Bilirubin: 0.7 mg/dL (ref 0.3–1.2)
Total Protein: 7.5 g/dL (ref 6.5–8.1)

## 2021-09-18 MED ORDER — IOHEXOL 300 MG/ML  SOLN
75.0000 mL | Freq: Once | INTRAMUSCULAR | Status: AC | PRN
  Administered 2021-09-18: 75 mL via INTRAVENOUS

## 2021-09-18 MED ORDER — SODIUM CHLORIDE (PF) 0.9 % IJ SOLN
INTRAMUSCULAR | Status: AC
  Filled 2021-09-18: qty 50

## 2021-09-21 ENCOUNTER — Inpatient Hospital Stay (HOSPITAL_BASED_OUTPATIENT_CLINIC_OR_DEPARTMENT_OTHER): Payer: Medicare Other | Admitting: Internal Medicine

## 2021-09-21 VITALS — BP 137/84 | HR 69 | Temp 98.7°F | Resp 16 | Wt 168.9 lb

## 2021-09-21 DIAGNOSIS — Z923 Personal history of irradiation: Secondary | ICD-10-CM | POA: Diagnosis not present

## 2021-09-21 DIAGNOSIS — C3490 Malignant neoplasm of unspecified part of unspecified bronchus or lung: Secondary | ICD-10-CM | POA: Diagnosis not present

## 2021-09-21 DIAGNOSIS — C3492 Malignant neoplasm of unspecified part of left bronchus or lung: Secondary | ICD-10-CM

## 2021-09-21 DIAGNOSIS — Z902 Acquired absence of lung [part of]: Secondary | ICD-10-CM | POA: Diagnosis not present

## 2021-09-21 DIAGNOSIS — Z85118 Personal history of other malignant neoplasm of bronchus and lung: Secondary | ICD-10-CM | POA: Diagnosis present

## 2021-09-21 NOTE — Progress Notes (Signed)
[place] [phone]   [phone]  OFFICE PROGRESS NOTE  Jennifer Fiedler, FNP [place] [address]  DIAGNOSIS: Recurrent non-small cell lung cancer that was initially diagnosed as stage IB (T2 a, N0, M0) non-small cell lung cancer, adenocarcinoma diagnosed in September 2020. She is status post left upper lobectomy with lymph node dissection with tumor size of 3.2 cm and negative lymphadenopathy at that time. She had disease recurrence in July 2022 with anterior mediastinal lymphadenopathy which was consistent with metastatic non-small cell lung cancer.    Biomarker Findings Tumor Mutational Burden - 21 Muts/Mb Microsatellite status - MS-Stable Genomic Findings For a complete list of the genes assayed, please refer to the Appendix. KRAS G12V TP53 F134L 7 Disease relevant genes with no reportable alterations: ALK, BRAF, EGFR, ERBB2, MET, RET, ROS1   PDL1: 0%   PRIOR THERAPY: 1)  Left upper lobectomy under the care of Dr. Kipp Brood in November 2020. 2) SBRT to the disease recurrence in the anterior mediastinal lymphadenopathy under the care of Dr. Lisbeth Renshaw. Last treatment 12/16/20.   CURRENT THERAPY: None  INTERVAL HISTORY: Jennifer [name] 76 y.o. female returns to the clinic today for returns to the clinic today for follow-up visit.  The patient is feeling fine today with no concerning complaints.  She denied having any current chest pain, shortness of breath, cough or hemoptysis.  She denied having any fever or chills.  She has no nausea, vomiting, diarrhea or constipation.  She has no headache or visual changes.  She denied having any recent weight loss or night sweats.  The patient had repeat CT scan of the chest performed recently and she is here for evaluation and discussion of her scan results.  MEDICAL HISTORY: Past Medical History:  Diagnosis Date   Cancer ([place])    Lung cancer   History of chicken pox     Hyperlipidemia    Hypertension    Menopause    Osteoporosis    Pneumonia    "walking" pneumonia   Pre-diabetes    Vitamin D deficiency     ALLERGIES:  is allergic to aspirin and latex.  MEDICATIONS:  Current Outpatient Medications  Medication Sig Dispense Refill   atorvastatin (LIPITOR) 40 MG tablet Take 40 mg by mouth every evening.      diltiazem (DILACOR XR) 240 MG 24 hr capsule Take 240 mg by mouth daily.     guaiFENesin (MUCINEX) 600 MG 12 hr tablet Take 1 tablet (600 mg total) by mouth 2 (two) times daily. (Patient taking differently: Take 600 mg by mouth daily.)     metoprolol succinate (TOPROL-XL) 50 MG 24 hr tablet Take 50 mg by mouth daily.     Multiple Vitamin (MULTIVITAMIN WITH MINERALS) TABS tablet Take 1 tablet by mouth daily.     triamterene-hydrochlorothiazide (MAXZIDE-25) 37.5-25 MG tablet Take 1 tablet by mouth daily.     No current facility-administered medications for this visit.    SURGICAL HISTORY:  Past Surgical History:  Procedure Laterality Date   CHEST TUBE INSERTION Left 01/19/2019   Procedure: Chest Tube Insertion;  Surgeon: Lajuana Matte, MD;  Location: [facility];  Service: Thoracic;  Laterality: Left;   PLEURADESIS N/A 01/06/2019   Procedure: Pleuradesis - Chemical;  Surgeon: Lajuana Matte, MD;  Location: [facility];  Service: Thoracic;  Laterality: N/A;   PLEURADESIS Left 01/19/2019   Procedure: Mechanical Pleuradesis;  Surgeon: Lajuana Matte, MD;  Location: [facility];  Service: Thoracic;  Laterality: Left;   VIDEO ASSISTED THORACOSCOPY Left 01/19/2019   Procedure: VIDEO ASSISTED THORACOSCOPY;  Surgeon: Lajuana Matte, MD;  Location: [facility];  Service: Thoracic;  Laterality: Left;   VIDEO ASSISTED THORACOSCOPY (VATS)/ LOBECTOMY Left 12/29/2018   Procedure: VIDEO ASSISTED THORACOSCOPY (VATS)/LEFT UPPER  LOBECTOMY with Mediastinal lymph node exploration.;  Surgeon: Lajuana Matte, MD;  Location: [facility];  Service: Thoracic;  Laterality:  Left;   VIDEO BRONCHOSCOPY N/A 12/29/2018   Procedure: VIDEO BRONCHOSCOPY;  Surgeon: Lajuana Matte, MD;  Location: [facility];  Service: Thoracic;  Laterality: N/A;   [place] N/A 12/09/2018   Procedure: VIDEO BRONCHOSCOPY WITH ENDOBRONCHIAL NAVIGATION;  Surgeon: Lajuana Matte, MD;  Location: [facility];  Service: Thoracic;  Laterality: N/A;   VIDEO BRONCHOSCOPY WITH INSERTION OF INTERBRONCHIAL VALVE (IBV) N/A 01/06/2019   Procedure: VIDEO BRONCHOSCOPY WITH INSERTION OF INTERBRONCHIAL VALVE (IBV);  Surgeon: Lajuana Matte, MD;  Location: [facility];  Service: Thoracic;  Laterality: N/A;   VIDEO BRONCHOSCOPY WITH INSERTION OF INTERBRONCHIAL VALVE (IBV) N/A 01/13/2019   Procedure: VIDEO BRONCHOSCOPY WITH INSERTION OF INTERBRONCHIAL VALVE (IBV);  Surgeon: Lajuana Matte, MD;  Location: [facility];  Service: Thoracic;  Laterality: N/A;   VIDEO BRONCHOSCOPY WITH INSERTION OF INTERBRONCHIAL VALVE (IBV) N/A 02/23/2019   Procedure: VIDEO BRONCHOSCOPY WITH REMOVAL OF INTERBRONCHIAL VALVE (IBV);  Surgeon: Lajuana Matte, MD;  Location: [facility] OR;  Service: Thoracic;  Laterality: N/A;    REVIEW OF SYSTEMS:  A comprehensive review of systems was negative.   PHYSICAL EXAMINATION: General appearance: alert, cooperative, and no distress Head: Normocephalic, without obvious abnormality, atraumatic Neck: no adenopathy, no JVD, supple, symmetrical, trachea midline, and thyroid not enlarged, symmetric, no tenderness/mass/nodules Lymph nodes: Cervical, supraclavicular, and axillary nodes normal. Resp: clear to auscultation bilaterally Back: symmetric, no curvature. ROM normal. No CVA tenderness. Cardio: regular rate and rhythm, S1, S2 normal, no murmur, click, rub or gallop GI: soft, non-tender; bowel sounds normal; no masses,  no organomegaly Extremities: extremities normal, atraumatic, no cyanosis or edema  ECOG PERFORMANCE STATUS: 1 - Symptomatic but completely  ambulatory  Blood pressure 137/84, pulse 69, temperature 98.7 F (37.1 C), temperature source Oral, resp. rate 16, weight 168 lb 14.4 oz (76.6 kg), SpO2 96 %.  LABORATORY DATA: Lab Results  Component Value Date   WBC 9.3 09/18/2021   HGB 13.0 09/18/2021   HCT 37.9 09/18/2021   MCV 91.3 09/18/2021   PLT 246 09/18/2021      Chemistry      Component Value Date/Time   NA 138 09/18/2021 1424   K 3.9 09/18/2021 1424   CL 100 09/18/2021 1424   CO2 29 09/18/2021 1424   BUN 16 09/18/2021 1424   CREATININE 1.34 (H) 09/18/2021 1424   GLU 845 10/28/2009 0000      Component Value Date/Time   CALCIUM 9.4 09/18/2021 1424   ALKPHOS 114 09/18/2021 1424   AST 21 09/18/2021 1424   ALT 18 09/18/2021 1424   BILITOT 0.7 09/18/2021 1424       RADIOGRAPHIC STUDIES: CT Chest W Contrast  Result Date: 09/19/2021 CLINICAL DATA:  Non-small cell lung cancer, assess treatment response in a 76 year old female. * Tracking Code: BO * EXAM: CT CHEST WITH CONTRAST TECHNIQUE: Multidetector CT imaging of the chest was performed during intravenous contrast administration. RADIATION DOSE REDUCTION: This exam was performed according to the departmental dose-optimization program which includes automated exposure control, adjustment of the mA and/or kV according to  patient size and/or use of iterative reconstruction technique. CONTRAST:  57m OMNIPAQUE IOHEXOL 300 MG/ML  SOLN COMPARISON:  None available FINDINGS: Cardiovascular: Extensive calcified and noncalcified aortic atherosclerotic plaque. No aneurysmal dilation of the thoracic aorta. Normal heart size without pericardial effusion or signs of pericardial nodularity. Distortion of LEFT hilum due to partial lung resection, otherwise unremarkable appearance of central pulmonary vasculature on venous phase. Mediastinum/Nodes: No thoracic inlet adenopathy. No axillary adenopathy. No mediastinal adenopathy. Stable 10 mm subcarinal lymph node. No hilar adenopathy. Mildly  patulous esophagus. Lungs/Pleura: Post LEFT upper lobectomy. Mild pleural thickening along the anterior LEFT chest and scarring along the LEFT mediastinal border without change. Very subtle nodularity in the anterior mediastinal fat at 4 mm previously 7 x 7 mm. Signs of pulmonary emphysema. Subtle pleural nodularity in the RIGHT chest displays no change along the major fissure in the RIGHT chest. Mild pleural and parenchymal scarring in the LEFT chest at the LEFT lung base also similarly stable. Stable 3 mm pulmonary nodule RIGHT lower lobe (image 112/100). Upper Abdomen: Incidental imaging of upper abdominal contents shows no acute process. Stable nodularity of the LEFT adrenal gland shown to be compatible with LEFT adrenal adenoma on previous PET imaging. Imaged portions of pancreas, spleen, RIGHT adrenal gland, LEFT kidney gastrointestinal tract is unremarkable. Musculoskeletal: No acute bone finding. No destructive bone process. Spinal degenerative changes. IMPRESSION: 1. Post LEFT upper lobectomy with areas of pleural and parenchymal scarring in the LEFT chest without change. 2. No new or progressive findings. Decreased very subtle nodularity in the anterior mediastinal fat and stable appearance of fissural nodularity in the RIGHT chest along with tiny RIGHT lower lobe nodule, attention on follow-up. 3. Stable LEFT adrenal adenoma. No dedicated imaging follow-up is recommended for this finding. 4. Aortic atherosclerosis. Aortic Atherosclerosis (ICD10-I70.0) and Emphysema (ICD10-J43.9). Electronically Signed   By: GZetta BillsM.D.   On: 09/19/2021 09:38    ASSESSMENT AND PLAN: This is a very pleasant 76years old white female with recurrent non-small cell lung cancer that was initially diagnosed as a stage Ib (T2 a, N0, M0) adenocarcinoma diagnosed in September 2020 status post left upper lobectomy with lymph node dissection with disease recurrence in July 2022 presenting with anterior mediastinal  lymphadenopathy.  The patient is status post curative radiotherapy to this area under the care of Dr. MLisbeth Renshawcompleted December 16, 2020.  She is currently on observation and feeling fine with no concerning complaints. The patient had repeat CT scan of the chest performed recently.  I personally and independently reviewed the scan and discussed the result with the patient today. Her scan showed no concerning findings for disease recurrence or metastasis. I recommended for the patient to continue on observation with repeat CT scan of the chest in 6 months. The patient was advised to call immediately if she has any concerning symptoms in the interval. The patient voices understanding of current disease status and treatment options and is in agreement with the current care plan.  All questions were answered. The patient knows to call the clinic with any problems, questions or concerns. We can certainly see the patient much sooner if necessary.  The total time spent in the appointment was 20 minutes.  Disclaimer: This note was dictated with voice recognition software. Similar sounding words can inadvertently be transcribed and may not be corrected upon review.

## 2022-03-16 ENCOUNTER — Telehealth: Payer: Self-pay | Admitting: Internal Medicine

## 2022-03-16 NOTE — Telephone Encounter (Signed)
Called patient regarding upcoming January appointments, left a voicemail.

## 2022-03-20 ENCOUNTER — Other Ambulatory Visit (HOSPITAL_COMMUNITY): Payer: Self-pay

## 2022-03-23 ENCOUNTER — Encounter (HOSPITAL_COMMUNITY): Payer: Self-pay

## 2022-03-23 ENCOUNTER — Ambulatory Visit (HOSPITAL_COMMUNITY)
Admission: RE | Admit: 2022-03-23 | Discharge: 2022-03-23 | Disposition: A | Discharge status: Home / Self Care | Payer: Medicare Other | Source: Ambulatory Visit | Attending: Internal Medicine | Admitting: Internal Medicine

## 2022-03-23 ENCOUNTER — Inpatient Hospital Stay: Payer: Medicare Other | Attending: Internal Medicine

## 2022-03-23 DIAGNOSIS — C3412 Malignant neoplasm of upper lobe, left bronchus or lung: Secondary | ICD-10-CM | POA: Insufficient documentation

## 2022-03-23 DIAGNOSIS — C3490 Malignant neoplasm of unspecified part of unspecified bronchus or lung: Secondary | ICD-10-CM | POA: Diagnosis present

## 2022-03-23 DIAGNOSIS — I10 Essential (primary) hypertension: Secondary | ICD-10-CM | POA: Insufficient documentation

## 2022-03-23 LAB — CBC WITH DIFFERENTIAL (CANCER CENTER ONLY)
Abs Immature Granulocytes: 0.02 10*3/uL (ref 0.00–0.07)
Basophils Absolute: 0.1 10*3/uL (ref 0.0–0.1)
Basophils Relative: 1 %
Eosinophils Absolute: 0.1 10*3/uL (ref 0.0–0.5)
Eosinophils Relative: 2 %
HCT: 38.2 % (ref 36.0–46.0)
Hemoglobin: 13.3 g/dL (ref 12.0–15.0)
Immature Granulocytes: 0 %
Lymphocytes Relative: 23 %
Lymphs Abs: 1.9 10*3/uL (ref 0.7–4.0)
MCH: 31.6 pg (ref 26.0–34.0)
MCHC: 34.8 g/dL (ref 30.0–36.0)
MCV: 90.7 fL (ref 80.0–100.0)
Monocytes Absolute: 0.5 10*3/uL (ref 0.1–1.0)
Monocytes Relative: 6 %
Neutro Abs: 5.5 10*3/uL (ref 1.7–7.7)
Neutrophils Relative %: 68 %
Platelet Count: 221 10*3/uL (ref 150–400)
RBC: 4.21 MIL/uL (ref 3.87–5.11)
RDW: 13.3 % (ref 11.5–15.5)
WBC Count: 8.1 10*3/uL (ref 4.0–10.5)
nRBC: 0 % (ref 0.0–0.2)

## 2022-03-23 LAB — CMP (CANCER CENTER ONLY)
ALT: 21 U/L (ref 0–44)
AST: 22 U/L (ref 15–41)
Albumin: 4.3 g/dL (ref 3.5–5.0)
Alkaline Phosphatase: 107 U/L (ref 38–126)
Anion gap: 8 (ref 5–15)
BUN: 17 mg/dL (ref 8–23)
CO2: 29 mmol/L (ref 22–32)
Calcium: 9.7 mg/dL (ref 8.9–10.3)
Chloride: 102 mmol/L (ref 98–111)
Creatinine: 1.19 mg/dL — ABNORMAL HIGH (ref 0.44–1.00)
GFR, Estimated: 48 mL/min — ABNORMAL LOW (ref >60)
Glucose, Bld: 112 mg/dL — ABNORMAL HIGH (ref 70–99)
Potassium: 4.2 mmol/L (ref 3.5–5.1)
Sodium: 139 mmol/L (ref 135–145)
Total Bilirubin: 0.8 mg/dL (ref 0.3–1.2)
Total Protein: 7.5 g/dL (ref 6.5–8.1)

## 2022-03-23 MED ORDER — IOHEXOL 300 MG/ML  SOLN
75.0000 mL | Freq: Once | INTRAMUSCULAR | Status: AC | PRN
  Administered 2022-03-23: 75 mL via INTRAVENOUS

## 2022-03-27 ENCOUNTER — Inpatient Hospital Stay (HOSPITAL_BASED_OUTPATIENT_CLINIC_OR_DEPARTMENT_OTHER): Payer: Medicare Other | Admitting: Internal Medicine

## 2022-03-27 VITALS — BP 152/73 | HR 82 | Temp 97.8°F | Resp 19 | Ht 69.0 in | Wt 173.8 lb

## 2022-03-27 DIAGNOSIS — C3490 Malignant neoplasm of unspecified part of unspecified bronchus or lung: Secondary | ICD-10-CM | POA: Diagnosis not present

## 2022-03-27 DIAGNOSIS — I10 Essential (primary) hypertension: Secondary | ICD-10-CM | POA: Diagnosis not present

## 2022-03-27 DIAGNOSIS — C3412 Malignant neoplasm of upper lobe, left bronchus or lung: Secondary | ICD-10-CM | POA: Diagnosis not present

## 2022-03-27 NOTE — Progress Notes (Signed)
[facility] [phone]   Fax:(336) [phone]  PROGRESS NOTE FOR TELEMEDICINE VISITS  Beverley Fiedler, FNP [place] [address]  I connected withNAME@ on 03/27/22 at  1:45 PM EST by video enabled telemedicine visit and verified that I am speaking with the correct person using two identifiers.   I discussed the limitations, risks, security and privacy concerns of performing an evaluation and management service by telemedicine and the availability of in-person appointments. I also discussed with the patient that there may be a patient responsible charge related to this service. The patient expressed understanding and agreed to proceed.  Other persons participating in the visit and their role in the encounter:  None  Patient's location: [facility] exam room Provider's location: [place] office  DIAGNOSIS: Recurrent non-small cell lung cancer that was initially diagnosed as stage IB (T2 a, N0, M0) non-small cell lung cancer, adenocarcinoma diagnosed in September 202. She is status post left upper lobectomy with lymph node dissection with tumor size of 3.2 cm and negative lymphadenopathy at that time. She had disease recurrence in July 2022 with anterior mediastinal lymphadenopathy which was consistent with metastatic non-small cell lung cancer.    Biomarker Findings Tumor Mutational Burden - 21 Muts/Mb Microsatellite status - MS-Stable Genomic Findings For a complete list of the genes assayed, please refer to the Appendix. KRAS G12V TP53 F134L 7 Disease relevant genes with no reportable alterations: ALK, BRAF, EGFR, ERBB2, MET, RET, ROS1   PDL1: 0%   PRIOR THERAPY: 1)  Left upper lobectomy under the care of Dr. Kipp Brood in November 2020. 2) SBRT to the disease recurrence in the anterior mediastinal lymphadenopathy under the care of Dr. Lisbeth Renshaw. Last treatment 12/16/20.   CURRENT THERAPY:  Observation.  INTERVAL HISTORY: Jennifer [name] 77 y.o. female returns to the clinic today for 6 months follow-up visit.  The patient is feeling fine today with no concerning complaints.  She denied having any current chest pain, shortness of breath, cough or hemoptysis.  She has no nausea, vomiting, diarrhea or constipation.  She has no headache or visual changes.  She denied having any significant weight loss or night sweats.  The patient had repeat CT scan of the chest performed recently and she is here for evaluation and discussion of her scan results and treatment options.  MEDICAL HISTORY: Past Medical History:  Diagnosis Date   Cancer ([place])    Lung cancer   History of chicken pox    Hyperlipidemia    Hypertension    Menopause    Osteoporosis    Pneumonia    "walking" pneumonia   Pre-diabetes    Vitamin D deficiency     ALLERGIES:  is allergic to aspirin and latex.  MEDICATIONS:  Current Outpatient Medications  Medication Sig Dispense Refill   atorvastatin (LIPITOR) 40 MG tablet Take 40 mg by mouth every evening.      diltiazem (DILACOR XR) 240 MG 24 hr capsule Take 240 mg by mouth daily.     guaiFENesin (MUCINEX) 600 MG 12 hr tablet Take 1 tablet (600 mg total) by mouth 2 (two) times daily. (Patient taking differently: Take 600 mg by mouth daily.)     metoprolol succinate (TOPROL-XL) 50 MG 24 hr tablet Take 50 mg by mouth daily.     Multiple Vitamin (MULTIVITAMIN WITH MINERALS) TABS tablet Take 1 tablet by mouth daily.     triamterene-hydrochlorothiazide (MAXZIDE-25) 37.5-25 MG tablet Take 1 tablet by mouth daily.  No current facility-administered medications for this visit.    SURGICAL HISTORY:  Past Surgical History:  Procedure Laterality Date   CHEST TUBE INSERTION Left 01/19/2019   Procedure: Chest Tube Insertion;  Surgeon: Lajuana Matte, MD;  Location: [facility];  Service: Thoracic;  Laterality: Left;   PLEURADESIS N/A 01/06/2019   Procedure: Pleuradesis -  Chemical;  Surgeon: Lajuana Matte, MD;  Location: [facility];  Service: Thoracic;  Laterality: N/A;   PLEURADESIS Left 01/19/2019   Procedure: Mechanical Pleuradesis;  Surgeon: Lajuana Matte, MD;  Location: [facility];  Service: Thoracic;  Laterality: Left;   VIDEO ASSISTED THORACOSCOPY Left 01/19/2019   Procedure: VIDEO ASSISTED THORACOSCOPY;  Surgeon: Lajuana Matte, MD;  Location: [facility];  Service: Thoracic;  Laterality: Left;   VIDEO ASSISTED THORACOSCOPY (VATS)/ LOBECTOMY Left 12/29/2018   Procedure: VIDEO ASSISTED THORACOSCOPY (VATS)/LEFT UPPER  LOBECTOMY with Mediastinal lymph node exploration.;  Surgeon: Lajuana Matte, MD;  Location: [facility];  Service: Thoracic;  Laterality: Left;   VIDEO BRONCHOSCOPY N/A 12/29/2018   Procedure: VIDEO BRONCHOSCOPY;  Surgeon: Lajuana Matte, MD;  Location: [facility];  Service: Thoracic;  Laterality: N/A;   [place] N/A 12/09/2018   Procedure: VIDEO BRONCHOSCOPY WITH ENDOBRONCHIAL NAVIGATION;  Surgeon: Lajuana Matte, MD;  Location: [facility];  Service: Thoracic;  Laterality: N/A;   VIDEO BRONCHOSCOPY WITH INSERTION OF INTERBRONCHIAL VALVE (IBV) N/A 01/06/2019   Procedure: VIDEO BRONCHOSCOPY WITH INSERTION OF INTERBRONCHIAL VALVE (IBV);  Surgeon: Lajuana Matte, MD;  Location: [facility];  Service: Thoracic;  Laterality: N/A;   VIDEO BRONCHOSCOPY WITH INSERTION OF INTERBRONCHIAL VALVE (IBV) N/A 01/13/2019   Procedure: VIDEO BRONCHOSCOPY WITH INSERTION OF INTERBRONCHIAL VALVE (IBV);  Surgeon: Lajuana Matte, MD;  Location: [facility] OR;  Service: Thoracic;  Laterality: N/A;   VIDEO BRONCHOSCOPY WITH INSERTION OF INTERBRONCHIAL VALVE (IBV) N/A 02/23/2019   Procedure: VIDEO BRONCHOSCOPY WITH REMOVAL OF INTERBRONCHIAL VALVE (IBV);  Surgeon: Lajuana Matte, MD;  Location: [facility] OR;  Service: Thoracic;  Laterality: N/A;    REVIEW OF SYSTEMS:  A comprehensive review of systems was negative.   ECOG  PERFORMANCE STATUS: 1 - Symptomatic but completely ambulatory  Blood pressure (!) 152/73, pulse 82, temperature 97.8 F (36.6 C), temperature source Temporal, resp. rate 19, height 5\' 9"  (1.753 m), weight 173 lb 12.8 oz (78.8 kg), SpO2 97 %.  LABORATORY DATA: Lab Results  Component Value Date   WBC 8.1 03/23/2022   HGB 13.3 03/23/2022   HCT 38.2 03/23/2022   MCV 90.7 03/23/2022   PLT 221 03/23/2022      Chemistry      Component Value Date/Time   NA 139 03/23/2022 1053   K 4.2 03/23/2022 1053   CL 102 03/23/2022 1053   CO2 29 03/23/2022 1053   BUN 17 03/23/2022 1053   CREATININE 1.19 (H) 03/23/2022 1053   GLU 845 10/28/2009 0000      Component Value Date/Time   CALCIUM 9.7 03/23/2022 1053   ALKPHOS 107 03/23/2022 1053   AST 22 03/23/2022 1053   ALT 21 03/23/2022 1053   BILITOT 0.8 03/23/2022 1053       RADIOGRAPHIC STUDIES: CT Chest W Contrast  Result Date: 03/26/2022 CLINICAL DATA:  Non-small-cell lung cancer. Restaging. * Tracking Code: BO * EXAM: CT CHEST WITH CONTRAST TECHNIQUE: Multidetector CT imaging of the chest was performed during intravenous contrast administration. RADIATION DOSE REDUCTION: This exam was performed according to the departmental dose-optimization program which includes automated exposure  control, adjustment of the mA and/or kV according to patient size and/or use of iterative reconstruction technique. CONTRAST:  43mL OMNIPAQUE IOHEXOL 300 MG/ML  SOLN COMPARISON:  09/18/2021 FINDINGS: Cardiovascular: The heart size is normal. No substantial pericardial effusion. Moderate atherosclerotic calcification is noted in the wall of the thoracic aorta. Mediastinum/Nodes: Similar bilateral thyroid enlargement. 14 mm short axis AP window lymph node on image 52/2 was 7 mm previously. Index subcarinal lymph node measured previously at 10 mm short axis is 10 mm again today on 68/2. There is no hilar lymphadenopathy. The esophagus has normal imaging features. There is  no axillary lymphadenopathy. Lungs/Pleura: Volume loss left hemithorax compatible with left upper lobectomy. Centrilobular emphsyema noted. Stable small right lower lobe perifissural nodule on 89/7, likely a subpleural lymph node. Stable 3 mm right lower lobe nodule on 01/17/7. No new suspicious pulmonary nodule or mass. No focal airspace consolidation. No pleural effusion. Upper Abdomen: Stable nodular thickening of both adrenal glands. Subtle nodular contour of the inferior right liver (191/2) raises the question of cirrhosis. Musculoskeletal: No worrisome lytic or sclerotic osseous abnormality. IMPRESSION: 1. Interval enlargement of a 14 mm short axis AP window lymph node (7 mm previously). Close follow-up recommended as metastatic disease a concern. PET-CT may prove helpful to further evaluate. 2. Stable tiny right lower lobe pulmonary nodules, likely benign. 3. Subtle nodular contour of the inferior right liver raises the question of cirrhosis. 4.  Aortic Atherosclerosis (ICD10-I70.0). Electronically Signed   By: Misty Stanley M.D.   On: 03/26/2022 06:32    ASSESSMENT AND PLAN: This is a very pleasant 77 years old white female with likely recurrent non-small cell lung cancer that was initially diagnosed as stage IB (T2 a, N0, M0) non-small cell lung cancer, adenocarcinoma and September 2021 status post left upper lobectomy with lymph node dissection with tumor size of 3.2 cm and negative lymphadenopathy at that time.  She was found to have disease recurrence in July 2022 with anterior mediastinal lymphadenopathy which was consistent with metastatic non-small cell lung cancer.  She is status post SBRT to the recurrent anterior mediastinal lymphadenopathy under the care of Dr. Lisbeth Renshaw completed in October 2022. Her tumor has no actionable mutations. The patient is currently on observation and feeling fine. She had repeat CT scan of the chest performed recently.  I personally and independently reviewed the  scan images and discussed the result and showed the images to the patient today. Unfortunately her scan showed evidence for enlargement of AP window lymph node that almost doubled in size from the previous scan. I recommended for the patient to have a PET scan for further evaluation of this lesion and to rule out any disease recurrence in the mediastinum. I will see her back for follow-up visit in 2 weeks for evaluation and discussion of her PET scan results and further recommendation regarding treatment of her condition. She was advised to call immediately if she has any other concerning symptoms in the interval. I discussed the assessment and treatment plan with the patient. The patient was provided an opportunity to ask questions and all were answered. The patient agreed with the plan and demonstrated an understanding of the instructions.   The patient was advised to call back or seek an in-person evaluation if the symptoms worsen or if the condition fails to improve as anticipated.  I provided 20 minutes of face-to-face video visit time during this encounter, and > 50% was spent counseling as documented under my assessment & plan.  Eilleen Kempf, MD 03/27/2022 1:53 PM  Disclaimer: This note was dictated with voice recognition software. Similar sounding words can inadvertently be transcribed and may not be corrected upon review.

## 2022-04-11 ENCOUNTER — Ambulatory Visit: Payer: Medicare Other | Admitting: Physician Assistant

## 2022-04-13 ENCOUNTER — Encounter (HOSPITAL_COMMUNITY)
Admission: RE | Admit: 2022-04-13 | Discharge: 2022-04-13 | Disposition: A | Discharge status: Home / Self Care | Payer: Medicare Other | Source: Ambulatory Visit | Attending: Internal Medicine | Admitting: Internal Medicine

## 2022-04-13 DIAGNOSIS — I700 Atherosclerosis of aorta: Secondary | ICD-10-CM | POA: Insufficient documentation

## 2022-04-13 DIAGNOSIS — C3490 Malignant neoplasm of unspecified part of unspecified bronchus or lung: Secondary | ICD-10-CM | POA: Insufficient documentation

## 2022-04-13 LAB — GLUCOSE, CAPILLARY: Glucose-Capillary: 116 mg/dL — ABNORMAL HIGH (ref 70–99)

## 2022-04-13 MED ORDER — FLUDEOXYGLUCOSE F - 18 (FDG) INJECTION
8.6000 | Freq: Once | INTRAVENOUS | Status: AC | PRN
  Administered 2022-04-13: 8.6 via INTRAVENOUS

## 2022-04-20 NOTE — Progress Notes (Unsigned)
[place] OFFICE PROGRESS NOTE  Beverley Fiedler, FNP [place] [id number]  DIAGNOSIS: Recurrent non-small cell lung cancer that was initially diagnosed as stage IB (T2 a, N0, M0) non-small cell lung cancer, adenocarcinoma diagnosed in September 202. She is status post left upper lobectomy with lymph node dissection with tumor size of 3.2 cm and negative lymphadenopathy at that time. She had disease recurrence in July 2022 with anterior mediastinal lymphadenopathy which was consistent with metastatic non-small cell lung cancer.    Biomarker Findings Tumor Mutational Burden - 21 Muts/Mb Microsatellite status - MS-Stable Genomic Findings For a complete list of the genes assayed, please refer to the Appendix. KRAS G12V TP53 F134L 7 Disease relevant genes with no reportable alterations: ALK, BRAF, EGFR, ERBB2, MET, RET, ROS1   PDL1: 0%  PRIOR THERAPY: 1)  Left upper lobectomy under the care of Dr. Kipp Brood in November 2020. 2) SBRT to the disease recurrence in the anterior mediastinal lymphadenopathy under the care of Dr. Lisbeth Renshaw. Last treatment 12/16/20.  CURRENT THERAPY: Possible concurrent chemoradiation with carboplatin for an AUC of 2 and Taxol 45 mg/m2. First dose expected on 05/07/22   INTERVAL HISTORY: Jennifer [name] 77 y.o. female returns to the clinic today for a follow-up visit.  The patient last saw Dr. [name] on 03/27/2022.  At that point in time, she was seen with a restaging CT scan for 29-monthfollow-up visit.  Unfortunately, the patient had an enlarging AP window lymph node that is concerning for metastatic lymphadenopathy.  Therefore, Dr. MJulien Nordmannarrange for PET scan.  Since last being seen the patient denies any major changes in her health.  Denies any fever, chills, night sweats, or unexplained weight loss.  Denies any chest pain, shortness of breath, cough, or hemoptysis.  Denies any nausea vomiting, diarrhea, or constipation.  Denies any  headache or visual changes.  She is here today to review her PET scan results and for a more detailed discussion about her current condition and recommended treatment options.  MEDICAL HISTORY: Past Medical History:  Diagnosis Date   [place]    Lung cancer   History of chicken pox    Hyperlipidemia    Hypertension    Menopause    Osteoporosis    Pneumonia    "walking" pneumonia   Pre-diabetes    Vitamin D deficiency     ALLERGIES:  is allergic to aspirin and latex.  MEDICATIONS:  Current Outpatient Medications  Medication Sig Dispense Refill   atorvastatin (LIPITOR) 40 MG tablet Take 40 mg by mouth every evening.      diltiazem (DILACOR XR) 240 MG 24 hr capsule Take 240 mg by mouth daily.     guaiFENesin (MUCINEX) 600 MG 12 hr tablet Take 1 tablet (600 mg total) by mouth 2 (two) times daily. (Patient taking differently: Take 600 mg by mouth daily.)     metoprolol succinate (TOPROL-XL) 50 MG 24 hr tablet Take 50 mg by mouth daily.     Multiple Vitamin (MULTIVITAMIN WITH MINERALS) TABS tablet Take 1 tablet by mouth daily.     prochlorperazine (COMPAZINE) 10 MG tablet Take 1 tablet (10 mg total) by mouth every 6 (six) hours as needed. 30 tablet 2   triamterene-hydrochlorothiazide (MAXZIDE-25) 37.5-25 MG tablet Take 1 tablet by mouth daily.     No current facility-administered medications for this visit.    SURGICAL HISTORY:  Past Surgical History:  Procedure Laterality Date   CHEST TUBE INSERTION Left 01/19/2019  Procedure: Chest Tube Insertion;  Surgeon: Lajuana Matte, MD;  Location: [facility];  Service: Thoracic;  Laterality: Left;   PLEURADESIS N/A 01/06/2019   Procedure: Pleuradesis - Chemical;  Surgeon: Lajuana Matte, MD;  Location: [facility];  Service: Thoracic;  Laterality: N/A;   PLEURADESIS Left 01/19/2019   Procedure: Mechanical Pleuradesis;  Surgeon: Lajuana Matte, MD;  Location: [place];  Service: Thoracic;  Laterality: Left;   VIDEO ASSISTED  THORACOSCOPY Left 01/19/2019   Procedure: VIDEO ASSISTED THORACOSCOPY;  Surgeon: Lajuana Matte, MD;  Location: [facility];  Service: Thoracic;  Laterality: Left;   VIDEO ASSISTED THORACOSCOPY (VATS)/ LOBECTOMY Left 12/29/2018   Procedure: VIDEO ASSISTED THORACOSCOPY (VATS)/LEFT UPPER  LOBECTOMY with Mediastinal lymph node exploration.;  Surgeon: Lajuana Matte, MD;  Location: [facility];  Service: Thoracic;  Laterality: Left;   VIDEO BRONCHOSCOPY N/A 12/29/2018   Procedure: VIDEO BRONCHOSCOPY;  Surgeon: Lajuana Matte, MD;  Location: [facility];  Service: Thoracic;  Laterality: N/A;   [place] N/A 12/09/2018   Procedure: VIDEO BRONCHOSCOPY WITH ENDOBRONCHIAL NAVIGATION;  Surgeon: Lajuana Matte, MD;  Location: [facility];  Service: Thoracic;  Laterality: N/A;   VIDEO BRONCHOSCOPY WITH INSERTION OF INTERBRONCHIAL VALVE (IBV) N/A 01/06/2019   Procedure: VIDEO BRONCHOSCOPY WITH INSERTION OF INTERBRONCHIAL VALVE (IBV);  Surgeon: Lajuana Matte, MD;  Location: [facility];  Service: Thoracic;  Laterality: N/A;   VIDEO BRONCHOSCOPY WITH INSERTION OF INTERBRONCHIAL VALVE (IBV) N/A 01/13/2019   Procedure: VIDEO BRONCHOSCOPY WITH INSERTION OF INTERBRONCHIAL VALVE (IBV);  Surgeon: Lajuana Matte, MD;  Location[facility] OR;  Service: Thoracic;  Laterality: N/A;   VIDEO BRONCHOSCOPY WITH INSERTION OF INTERBRONCHIAL VALVE (IBV) N/A 02/23/2019   Procedure: VIDEO BRONCHOSCOPY WITH REMOVAL OF INTERBRONCHIAL VALVE (IBV);  Surgeon: Lajuana Matte, MD;  Location: [facility] OR;  Service: Thoracic;  Laterality: N/A;    REVIEW OF SYSTEMS:   Review of Systems  Constitutional: Negative for appetite change, chills, fatigue, fever and unexpected weight change.  HENT: Negative for mouth sores, nosebleeds, sore throat and trouble swallowing.   Eyes: Negative for eye problems and icterus.  Respiratory: Negative for cough, hemoptysis, shortness of breath and wheezing.    Cardiovascular: Negative for chest pain and leg swelling.  Gastrointestinal: Negative for abdominal pain, constipation, diarrhea, nausea and vomiting.  Genitourinary: Negative for bladder incontinence, difficulty urinating, dysuria, frequency and hematuria.   Musculoskeletal: Negative for back pain, gait problem, neck pain and neck stiffness.  Skin: Negative for itching and rash.  Neurological: Negative for dizziness, extremity weakness, gait problem, headaches, light-headedness and seizures.  Hematological: Negative for adenopathy. Does not bruise/bleed easily.  Psychiatric/Behavioral: Negative for confusion, depression and sleep disturbance. The patient is not nervous/anxious.     PHYSICAL EXAMINATION:  Blood pressure 133/70, pulse 99, temperature 97.8 F (36.6 C), temperature source Temporal, resp. rate 16, height '5\' 9"'$  (1.753 m), weight 174 lb 12.8 oz (79.3 kg), SpO2 99 %.  ECOG PERFORMANCE STATUS: 1  Physical Exam  Constitutional: Oriented to person, place, and time and well-developed, well-nourished, and in no distress.  HENT:  Head: Normocephalic and atraumatic.  Mouth/Throat: Oropharynx is clear and moist. No oropharyngeal exudate.  Eyes: Conjunctivae are normal. Right eye exhibits no discharge. Left eye exhibits no discharge. No scleral icterus.  Neck: Normal range of motion. Neck supple.  Cardiovascular: Normal rate, regular rhythm, normal [name] sounds and intact distal pulses.   Pulmonary/Chest: Effort normal and breath sounds normal. No respiratory distress. No wheezes. No rales.  Abdominal: Soft. Bowel sounds are normal. Exhibits no distension and no mass. There is no tenderness.  Musculoskeletal: Normal range of motion. Exhibits no edema.  Lymphadenopathy:    No cervical adenopathy.  Neurological: Alert and oriented to person, place, and time. Exhibits normal muscle tone. Gait normal. Coordination normal.  Skin: Skin is warm and dry. No rash noted. Not diaphoretic. No  erythema. No pallor.  Psychiatric: Mood, memory and judgment normal.  Vitals reviewed.  LABORATORY DATA: Lab Results  Component Value Date   WBC 8.1 03/23/2022   HGB 13.3 03/23/2022   HCT 38.2 03/23/2022   MCV 90.7 03/23/2022   PLT 221 03/23/2022      Chemistry      Component Value Date/Time   NA 139 03/23/2022 1053   K 4.2 03/23/2022 1053   CL 102 03/23/2022 1053   CO2 29 03/23/2022 1053   BUN 17 03/23/2022 1053   CREATININE 1.19 (H) 03/23/2022 1053   GLU 845 10/28/2009 0000      Component Value Date/Time   CALCIUM 9.7 03/23/2022 1053   ALKPHOS 107 03/23/2022 1053   AST 22 03/23/2022 1053   ALT 21 03/23/2022 1053   BILITOT 0.8 03/23/2022 1053       RADIOGRAPHIC STUDIES:  NM PET Image Restage (PS) Skull Base to Thigh (F-18 FDG)  Result Date: 04/15/2022 CLINICAL DATA:  Subsequent treatment strategy for non-small-cell lung cancer. EXAM: NUCLEAR MEDICINE PET SKULL BASE TO THIGH TECHNIQUE: 8.6 mCi F-18 FDG was injected intravenously. Full-ring PET imaging was performed from the skull base to thigh after the radiotracer. CT data was obtained and used for attenuation correction and anatomic localization. Fasting blood glucose: 116 mg/dl COMPARISON:  Chest CT 03/23/2022.  PET-CT 10/14/2020 FINDINGS: Mediastinal blood pool activity: SUV max 2.8 Liver activity: SUV max NA NECK: No hypermetabolic lymph nodes in the neck. Incidental CT findings: None. CHEST: AP window lymph node of concern on recent diagnostic chest CT is stable at 14 mm short axis. This lymph node is markedly hypermetabolic with SUV max = 0000000. No other hypermetabolic lymphadenopathy in the chest. There is a 7 mm short axis precarinal lymph node on image 71/4 without hypermetabolism ( SUV max = 2.8). There is no hypermetabolic pulmonary nodule or mass. Incidental CT findings: Centrilobular emphsyema noted. Volume loss in the left hemithorax with surgical changes in the hilum, consistent with previous left upper  lobectomy. The tiny (3 mm) nodule seen on previous diagnostic chest CT are not readily evident on today's non breath hold CT imaging for attenuation correction. Mild atherosclerotic calcification is noted in the wall of the thoracic aorta. ABDOMEN/PELVIS: No abnormal hypermetabolic activity within the liver, pancreas, adrenal glands, or spleen. No hypermetabolic lymph nodes in the abdomen or pelvis. Incidental CT findings: There is moderate atherosclerotic calcification of the abdominal aorta without aneurysm. Diverticular changes are noted in the left colon without evidence of diverticulitis. SKELETON: No focal hypermetabolic activity to suggest skeletal metastasis. Incidental CT findings: No worrisome lytic or sclerotic osseous abnormality. IMPRESSION: 1. 14 mm short axis AP window lymph node of concern on recent diagnostic chest CT is markedly hypermetabolic, consistent with metastatic disease. 2. No other sites of hypermetabolic disease in the chest, abdomen, or pelvis on today's study. 3. Tiny pulmonary nodules seen on previous diagnostic chest CT are not readily evident on today's non breath hold CT imaging for attenuation correction. 4.  Aortic Atherosclerosis (ICD10-I70.0). Electronically Signed   By: Misty Stanley M.D.   On: 04/15/2022 06:44  ASSESSMENT/PLAN:  This is a very pleasant 77 year old Caucasian female with recurrent non-small cell lung cancer that was initially diagnosed with stage Ib (T2 a, N0, M0) non-small cell lung cancer, adenocarcinoma.  She was initially diagnosed in September of 2020.  She was initially diagnosed with a left upper lobe lesion status post left upper lobectomy lymph node dissection with a tumor size of 3.2 and negative lymphadenopathy at that time.  The patient and September 2022 was found to have disease recurrence with biopsy-proven metastatic non-small cell lung cancer in the mediastinal lymph node.  PD-L1 expression is 0%.  She is negative for any actionable  mutations.    She completed SBRT to the disease recurrence on 12/16/20 under the care of Dr. Lisbeth Renshaw.   At the patient's last appointment, she had a restaging CT scan that showed enlarging AP window lymph node.   Therefore, the patient had a PET scan recently to further evaluate this.   The patient was seen with Dr. [name] today.  Dr. [name] personally and independently reviewed the results of her PET scan and discussed the results with the patient today.  The AP window lymph node is significantly hypermetabolic.  Therefore, this is compatible with disease recurrence.  Dr. [name] recommends referral to radiation oncology for consideration of radiation to this area.  Dr. [name] recommends chemo sensitizing radiation but if Dr. Lisbeth Renshaw feels that this is treatable with radiation alone, then it is okay from our standpoint Dr. [name] is wondering if radiation oncology could also radiate other nearby lymph nodes as the patient has already had history of recurrence in the past.  Dr. [name] does not feel that this area is amenable to a biopsy.  We will tentatively schedule the patient to start concurrent chemoradiation on 05/07/2022.  I will arrange for chemo education class prior to starting her first cycle of treatment.  I sent a prescription for Compazine 10 mg p.o. every 6 hours as needed to the patient's pharmacy.  We will see her back for follow-up visit on the first cycle of her treatment  I will order a brain MRI to complete the staging workup.  The patient was advised to call immediately if she has any concerning symptoms in the interval. The patient voices understanding of current disease status and treatment options and is in agreement with the current care plan. All questions were answered. The patient knows to call the clinic with any problems, questions or concerns. We can certainly see the patient much sooner if necessary   Orders Placed This Encounter  Procedures   MR Brain W Wo  Contrast    Standing Status:   Future    Standing Expiration Date:   04/23/2023    Order Specific Question:   If indicated for the ordered procedure, I authorize the administration of contrast media per Radiology protocol    Answer:   Yes    Order Specific Question:   What is the patient's sedation requirement?    Answer:   No Sedation    Order Specific Question:   Does the patient have a pacemaker or implanted devices?    Answer:   No    Order Specific Question:   Use SRS Protocol?    Answer:   No    Order Specific Question:   Preferred imaging location?    Answer:   [facility] (table limit - 550 lbs)   CBC with Differential ([place] Only)    Standing Status:   Standing  Number of Occurrences:   6    Standing Expiration Date:   04/24/2023   CMP ([place] only)    Standing Status:   Standing    Number of Occurrences:   6    Standing Expiration Date:   04/24/2023   CBC with Differential (Cancer Center Only)    Standing Status:   Future    Standing Expiration Date:   05/08/2023   CMP ([place] only)    Standing Status:   Future    Standing Expiration Date:   05/08/2023   Ambulatory referral to Radiation Oncology    Referral Priority:   Urgent    Referral Type:   Consultation    Referral Reason:   Specialty Services Required    Requested Specialty:   Radiation Oncology    Number of Visits Requested:   [place], PA-C 04/23/22  ADDENDUM: Hematology/Oncology Attending: I had a face-to-face encounter with the patient today.  I reviewed her record, lab, scan and recommended her care plan.  This is a very pleasant 77 years old white female with recurrent non-small cell lung cancer that was initially diagnosed as a stage Ib adenocarcinoma in September 2020 status post left upper lobectomy.  The patient had evidence for disease recurrence in July 2022 with anterior mediastinal lymphadenopathy consistent with metastatic non-small cell lung  cancer status post SBRT under the care of Dr. Lisbeth Renshaw.  She was followed by observation and recent imaging studies showed enlargement of left AP window lymphadenopathy.  The patient had a PET scan performed that confirmed the hypermetabolic activity in this area.  She is here today for evaluation and discussion of her treatment options based on the new findings.  She was accompanied by her daughter. I had a lengthy discussion with the patient and her daughter today about her condition and treatment options.  I recommended for the patient a course of concurrent chemoradiation with weekly carboplatin for AUC of 2 and paclitaxel 45 Mg/M2 for 6-7 weeks concurrent with radiation.  I am not sure if the patient will be a candidate for another SBRT to this area. She is expected to start the first dose of this treatment on May 07, 2022.  Will refer the patient back to Dr. Lisbeth Renshaw for discussion of the radiotherapy option. We discussed with the patient the adverse effect of the chemotherapy including but not limited to alopecia, myelosuppression, nausea and vomiting, peripheral neuropathy, liver or renal dysfunction. The patient was advised to call immediately if she has any other concerning symptoms in the interval. The total time spent in the appointment was 30 minutes. Disclaimer: This note was dictated with voice recognition software. Similar sounding words can inadvertently be transcribed and may be missed upon review. Eilleen Kempf, MD

## 2022-04-23 ENCOUNTER — Telehealth: Payer: Self-pay | Admitting: Radiation Oncology

## 2022-04-23 ENCOUNTER — Inpatient Hospital Stay: Payer: Medicare Other | Attending: Internal Medicine | Admitting: Physician Assistant

## 2022-04-23 ENCOUNTER — Other Ambulatory Visit: Payer: Self-pay

## 2022-04-23 ENCOUNTER — Inpatient Hospital Stay: Payer: Medicare Other

## 2022-04-23 VITALS — BP 133/70 | HR 99 | Temp 97.8°F | Resp 16 | Ht 69.0 in | Wt 174.8 lb

## 2022-04-23 DIAGNOSIS — C3490 Malignant neoplasm of unspecified part of unspecified bronchus or lung: Secondary | ICD-10-CM

## 2022-04-23 DIAGNOSIS — Z902 Acquired absence of lung [part of]: Secondary | ICD-10-CM | POA: Insufficient documentation

## 2022-04-23 DIAGNOSIS — Z7189 Other specified counseling: Secondary | ICD-10-CM

## 2022-04-23 DIAGNOSIS — C3412 Malignant neoplasm of upper lobe, left bronchus or lung: Secondary | ICD-10-CM | POA: Diagnosis present

## 2022-04-23 DIAGNOSIS — C771 Secondary and unspecified malignant neoplasm of intrathoracic lymph nodes: Secondary | ICD-10-CM | POA: Diagnosis not present

## 2022-04-23 DIAGNOSIS — I10 Essential (primary) hypertension: Secondary | ICD-10-CM | POA: Diagnosis not present

## 2022-04-23 LAB — COMPREHENSIVE METABOLIC PANEL
ALT: 21 U/L (ref 0–44)
AST: 22 U/L (ref 15–41)
Albumin: 4.6 g/dL (ref 3.5–5.0)
Alkaline Phosphatase: 112 U/L (ref 38–126)
Anion gap: 8 (ref 5–15)
BUN: 16 mg/dL (ref 8–23)
CO2: 30 mmol/L (ref 22–32)
Calcium: 9.2 mg/dL (ref 8.9–10.3)
Chloride: 101 mmol/L (ref 98–111)
Creatinine, Ser: 1.05 mg/dL — ABNORMAL HIGH (ref 0.44–1.00)
GFR, Estimated: 55 mL/min — ABNORMAL LOW (ref >60)
Glucose, Bld: 99 mg/dL (ref 70–99)
Potassium: 3.9 mmol/L (ref 3.5–5.1)
Sodium: 139 mmol/L (ref 135–145)
Total Bilirubin: 0.7 mg/dL (ref 0.3–1.2)
Total Protein: 7.6 g/dL (ref 6.5–8.1)

## 2022-04-23 MED ORDER — PROCHLORPERAZINE MALEATE 10 MG PO TABS: 10.0000 mg | ORAL_TABLET | Freq: Four times a day (QID) | ORAL | 2 refills | Status: DC | PRN

## 2022-04-23 NOTE — Telephone Encounter (Signed)
2/26 @ 11:00 am Left voicemail for patient to call our office to be schedule for consult.

## 2022-04-23 NOTE — Progress Notes (Signed)
START OFF PATHWAY REGIMEN - Non-Small Cell Lung   [id number]:Carboplatin AUC=2 IV D1 + Paclitaxel 45 mg/m2 IV D1 q7 Days + RT:   A cycle is every 7 days, concurrent with RT:     Paclitaxel      Carboplatin   **Always confirm dose/schedule in your pharmacy ordering system**  Patient Characteristics: Local Recurrence Therapeutic Status: Local Recurrence Intent of Therapy: Curative Intent, Discussed with Patient

## 2022-04-23 NOTE — Patient Instructions (Signed)
-  We covered a lot of important information at your appointment today regarding what the treatment plan is moving forward. Here are the the main points that were discussed at your office visit with [place] today:  -The treatment that you will receive consists of two chemotherapy drugs called Carboplatin and Paclitaxel (also called Taxol) -We are planning on starting your treatment next week on 05/07/22 but before your start your treatment, I would like you to attend a Chemotherapy Education Class. This involves having you sit down with one of our nurse educators. She will discuss with you one-on-one more details about your treatment as well as general information about resources here at the [facility] treatment will be given every week for about 6 weeks or so (as long as you are also receiving radiation). We will check your labs (blood work) once a week . Dr. [name] or I will see you every other treatment just to make sure that you are doing well and that everything is on track. -We will get a CT scan about 3 weeks after you complete your treatment  Medications:  -Compazine was sent to your pharmacy. This medication is for nausea. You may take this every 6 hours as needed if you feel nausous.   Referrals -I will place the referral to radiation oncology to meet with you to discuss starting radiation treatment. Please be on the lookout for a phone call from them to schedule a consultation.   Follow Up:  -We will see you back for a follow up visit  with first week of treatment

## 2022-04-25 ENCOUNTER — Other Ambulatory Visit: Payer: Self-pay

## 2022-04-27 ENCOUNTER — Inpatient Hospital Stay: Payer: Medicare Other

## 2022-04-27 DIAGNOSIS — I10 Essential (primary) hypertension: Secondary | ICD-10-CM | POA: Insufficient documentation

## 2022-04-27 DIAGNOSIS — C3412 Malignant neoplasm of upper lobe, left bronchus or lung: Secondary | ICD-10-CM | POA: Insufficient documentation

## 2022-04-27 DIAGNOSIS — Z923 Personal history of irradiation: Secondary | ICD-10-CM | POA: Insufficient documentation

## 2022-04-27 DIAGNOSIS — Z902 Acquired absence of lung [part of]: Secondary | ICD-10-CM | POA: Insufficient documentation

## 2022-04-27 DIAGNOSIS — Z5111 Encounter for antineoplastic chemotherapy: Secondary | ICD-10-CM | POA: Insufficient documentation

## 2022-04-30 NOTE — Progress Notes (Signed)
Radiation Oncology         [phone] ________________________________  Name: Jennifer [name]        MRN: [id number]  Date of Service: 05/01/2022 DOB: 1945/12/08  [id number], Jennifer Roger, FNP  Curt Bears, MD     REFERRING PHYSICIAN: Curt Bears, MD   DIAGNOSIS: The primary encounter diagnosis was Adenocarcinoma of left lung, stage 1 ([name]). A diagnosis of Recurrent non-small cell lung cancer (NSCLC) ([name]) was also pertinent to this visit.   HISTORY OF PRESENT ILLNESS: Jennifer [name] is a 77 y.o. female seen at the request of Dr. [name] for diagnosis of lung cancer.  The patient was originally diagnosed with Stage IB, cT2aN0M0, NSCLC, adenocarcinoma of the LUL.  She was treated with left upper lobectomy in November 2020 with Dr. Kipp Brood.  Final pathology revealed a 3.2 cm tumor and no metastatic disease to the lymph nodes were identified.  She has been followed in surveillance, unfortunately recent imaging showed concerns for adenopathy in the chest along the mediastinum, the CT on 09/23/2020 reference to 1.1 cm prevascular lymph node previously 1 cm the year prior another prevascular node measuring 7 mm previously 4, and a PET scan on 10/14/2020 showed hypermetabolism in the anterior mediastinal lymph nodes concerning for recurrence submandibular uptake was also seen on the right.  She underwent a CT-guided biopsy of her mediastinal nodes on 11/09/2020 which showed metastatic non-small cell lung cancer.  She was not in favor of additional surgery and rather received ultrahypofractionated radiation which she completed on 12/16/20. She was being followed in surveillance and in 2023, scans continue\d to show stability without new disease.   However, imaging of the chest with CT and contrast on 03/23/2022 showed an interval enlargement of the AP window lymph node measuring 14 mm, a PET on 04/13/2022 showed hypermetabolic activity up to an SUV of 11 there was a 7 mm short axis precarinal node  without hypermetabolic activity.  No other hypermetabolic changes are noted in the lung nor was there evidence of metastasis.  Given concerns for local recurrence Dr. [name] has discussed chemoradiation.  She is scheduled to begin this on 05/08/2022.        PREVIOUS RADIATION THERAPY:   12/05/2020-12/16/2020  Ultrahypofractionated Treatment Site Technique Total Dose (Gy) Dose per Fx (Gy) Completed Fx Beam Energies  Lung, Left: Lung_Lt IMRT 60/60 6 10/10 6XFFF     PAST MEDICAL HISTORY:  Past Medical History:  Diagnosis Date   Cancer ([place])    Lung cancer   History of chicken pox    Hyperlipidemia    Hypertension    Menopause    Osteoporosis    Pneumonia    "walking" pneumonia   Pre-diabetes    Vitamin D deficiency        PAST SURGICAL HISTORY: Past Surgical History:  Procedure Laterality Date   CHEST TUBE INSERTION Left 01/19/2019   Procedure: Chest Tube Insertion;  Surgeon: Lajuana Matte, MD;  Location: ;  Service: Thoracic;  Laterality: Left;   PLEURADESIS N/A 01/06/2019   Procedure: Pleuradesis - Chemical;  Surgeon: Lajuana Matte, MD;  Location: [place];  Service: Thoracic;  Laterality: N/A;   PLEURADESIS Left 01/19/2019   Procedure: Mechanical Pleuradesis;  Surgeon: Lajuana Matte, MD;  Location: [place];  Service: Thoracic;  Laterality: Left;   VIDEO ASSISTED THORACOSCOPY Left 01/19/2019   Procedure: VIDEO ASSISTED THORACOSCOPY;  Surgeon: Lajuana Matte, MD;  Location: [place];  Service: Thoracic;  Laterality: Left;   VIDEO ASSISTED  THORACOSCOPY (VATS)/ LOBECTOMY Left 12/29/2018   Procedure: VIDEO ASSISTED THORACOSCOPY (VATS)/LEFT UPPER  LOBECTOMY with Mediastinal lymph node exploration.;  Surgeon: Lajuana Matte, MD;  Location: [place];  Service: Thoracic;  Laterality: Left;   VIDEO BRONCHOSCOPY N/A 12/29/2018   Procedure: VIDEO BRONCHOSCOPY;  Surgeon: Lajuana Matte, MD;  Location: [facility];  Service: Thoracic;  Laterality: N/A;   VIDEO  BRONCHOSCOPY WITH ENDOBRONCHIAL NAVIGATION N/A 12/09/2018   Procedure: VIDEO BRONCHOSCOPY WITH ENDOBRONCHIAL NAVIGATION;  Surgeon: Lajuana Matte, MD;  Location: [place];  Service: Thoracic;  Laterality: N/A;   VIDEO BRONCHOSCOPY WITH INSERTION OF INTERBRONCHIAL VALVE (IBV) N/A 01/06/2019   Procedure: VIDEO BRONCHOSCOPY WITH INSERTION OF INTERBRONCHIAL VALVE (IBV);  Surgeon: Lajuana Matte, MD;  Location: [place];  Service: Thoracic;  Laterality: N/A;   VIDEO BRONCHOSCOPY WITH INSERTION OF INTERBRONCHIAL VALVE (IBV) N/A 01/13/2019   Procedure: VIDEO BRONCHOSCOPY WITH INSERTION OF INTERBRONCHIAL VALVE (IBV);  Surgeon: Lajuana Matte, MD;  Location: [facility];  Service: Thoracic;  Laterality: N/A;   VIDEO BRONCHOSCOPY WITH INSERTION OF INTERBRONCHIAL VALVE (IBV) N/A 02/23/2019   Procedure: VIDEO BRONCHOSCOPY WITH REMOVAL OF INTERBRONCHIAL VALVE (IBV);  Surgeon: Lajuana Matte, MD;  Location: [place];  Service: Thoracic;  Laterality: N/A;     FAMILY HISTORY: History reviewed. No pertinent family history.   SOCIAL HISTORY:  reports that she has quit smoking. Her smoking use included cigarettes. She smoked an average of .5 packs per day. She has never used smokeless tobacco. She reports current alcohol use of about 1.0 standard drink of alcohol per week. She reports that she does not use drugs. The patient is widowed and lives in [place]. She's originally from [place].   ALLERGIES: Aspirin and Latex   MEDICATIONS:  Current Outpatient Medications  Medication Sig Dispense Refill   atorvastatin (LIPITOR) 40 MG tablet Take 40 mg by mouth every evening.      diltiazem (DILACOR XR) 240 MG 24 hr capsule Take 240 mg by mouth daily.     guaiFENesin (MUCINEX) 600 MG 12 hr tablet Take 1 tablet (600 mg total) by mouth 2 (two) times daily. (Patient taking differently: Take 600 mg by mouth daily.)     metoprolol succinate (TOPROL-XL) 50 MG 24 hr tablet Take 50 mg by mouth daily.     Multiple  Vitamin (MULTIVITAMIN WITH MINERALS) TABS tablet Take 1 tablet by mouth daily.     prochlorperazine (COMPAZINE) 10 MG tablet Take 1 tablet (10 mg total) by mouth every 6 (six) hours as needed. 30 tablet 2   triamterene-hydrochlorothiazide (MAXZIDE-25) 37.5-25 MG tablet Take 1 tablet by mouth daily.     No current facility-administered medications for this encounter.     REVIEW OF SYSTEMS: On review of systems, the patient reports that she is doing okay. She's nervous about her upcoming treatments. She reports she is not having any symptoms or concerns with her breathing. No other complaints are verbalized.      PHYSICAL EXAM:  Wt Readings from Last 3 Encounters:  05/01/22 172 lb 3.2 oz (78.1 kg)  04/23/22 174 lb 12.8 oz (79.3 kg)  03/27/22 173 lb 12.8 oz (78.8 kg)   Temp Readings from Last 3 Encounters:  05/01/22 97.8 F (36.6 C) (Temporal)  04/23/22 97.8 F (36.6 C) (Temporal)  03/27/22 97.8 F (36.6 C) (Temporal)   BP Readings from Last 3 Encounters:  05/01/22 137/70  04/23/22 133/70  03/27/22 (!) 152/73   Pulse Readings from Last 3 Encounters:  05/01/22 95  04/23/22 99  03/27/22 82   Pain Assessment Pain Score: 0-No pain/10  In general this is a well appearing caucasian female in no acute distress. she's alert and oriented x4 and appropriate throughout the examination. Cardiopulmonary assessment is negative for acute distress and she exhibits normal effort.     ECOG = 0  0 - Asymptomatic (Fully active, able to carry on all predisease activities without restriction)  1 - Symptomatic but completely ambulatory (Restricted in physically strenuous activity but ambulatory and able to carry out work of a light or sedentary nature. For example, light housework, office work)  2 - Symptomatic, <50% in bed during the day (Ambulatory and capable of all self care but unable to carry out any work activities. Up and about more than 50% of waking hours)  3 - Symptomatic, >50% in  bed, but not bedbound (Capable of only limited self-care, confined to bed or chair 50% or more of waking hours)  4 - Bedbound (Completely disabled. Cannot carry on any self-care. Totally confined to bed or chair)  5 - Death   Eustace Pen MM, Creech RH, Tormey DC, et al. [phone]). "Toxicity and response criteria of the [facility] Group". [employer] Oncol. 5 (6): 649-55    LABORATORY DATA:  Lab Results  Component Value Date   WBC 8.1 03/23/2022   HGB 13.3 03/23/2022   HCT 38.2 03/23/2022   MCV 90.7 03/23/2022   PLT 221 03/23/2022   Lab Results  Component Value Date   NA 139 04/23/2022   K 3.9 04/23/2022   CL 101 04/23/2022   CO2 30 04/23/2022   Lab Results  Component Value Date   ALT 21 04/23/2022   AST 22 04/23/2022   ALKPHOS 112 04/23/2022   BILITOT 0.7 04/23/2022      RADIOGRAPHY: NM PET Image Restage (PS) Skull Base to Thigh (F-18 FDG)  Result Date: 04/15/2022 CLINICAL DATA:  Subsequent treatment strategy for non-small-cell lung cancer. EXAM: NUCLEAR MEDICINE PET SKULL BASE TO THIGH TECHNIQUE: 8.6 mCi F-18 FDG was injected intravenously. Full-ring PET imaging was performed from the skull base to thigh after the radiotracer. CT data was obtained and used for attenuation correction and anatomic localization. Fasting blood glucose: 116 mg/dl COMPARISON:  Chest CT 03/23/2022.  PET-CT 10/14/2020 FINDINGS: Mediastinal blood pool activity: SUV max 2.8 Liver activity: SUV max NA NECK: No hypermetabolic lymph nodes in the neck. Incidental CT findings: None. CHEST: AP window lymph node of concern on recent diagnostic chest CT is stable at 14 mm short axis. This lymph node is markedly hypermetabolic with SUV max = 0000000. No other hypermetabolic lymphadenopathy in the chest. There is a 7 mm short axis precarinal lymph node on image 71/4 without hypermetabolism ( SUV max = 2.8). There is no hypermetabolic pulmonary nodule or mass. Incidental CT findings: Centrilobular emphsyema  noted. Volume loss in the left hemithorax with surgical changes in the hilum, consistent with previous left upper lobectomy. The tiny (3 mm) nodule seen on previous diagnostic chest CT are not readily evident on today's non breath hold CT imaging for attenuation correction. Mild atherosclerotic calcification is noted in the wall of the thoracic aorta. ABDOMEN/PELVIS: No abnormal hypermetabolic activity within the liver, pancreas, adrenal glands, or spleen. No hypermetabolic lymph nodes in the abdomen or pelvis. Incidental CT findings: There is moderate atherosclerotic calcification of the abdominal aorta without aneurysm. Diverticular changes are noted in the left colon without evidence of diverticulitis. SKELETON: No focal hypermetabolic activity to suggest skeletal  metastasis. Incidental CT findings: No worrisome lytic or sclerotic osseous abnormality. IMPRESSION: 1. 14 mm short axis AP window lymph node of concern on recent diagnostic chest CT is markedly hypermetabolic, consistent with metastatic disease. 2. No other sites of hypermetabolic disease in the chest, abdomen, or pelvis on today's study. 3. Tiny pulmonary nodules seen on previous diagnostic chest CT are not readily evident on today's non breath hold CT imaging for attenuation correction. 4.  Aortic Atherosclerosis (ICD10-I70.0). Electronically Signed   By: Misty Stanley M.D.   On: 04/15/2022 06:44       IMPRESSION/PLAN: 1. Recurrent Stage IB, cT2aN0M0, NSCLC, of the left upper lobe with disease in the mediastinum. Dr. Lisbeth Renshaw discusses the patient's course to date and agrees with Dr. Worthy Flank recommendations for chemoradiation to the chest. We discussed the risks, benefits, short, and long term effects of radiotherapy, as well as the curative intent, and the patient is interested in proceeding. Dr. Lisbeth Renshaw discusses the delivery and logistics of radiotherapy and anticipates a course of up to 6 1/2 weeks of radiotherapy with her prior treatment  dosimetry being considered in the dosing. Written consent is obtained and placed in the chart, a copy was provided to the patient. She will simulate today and start on 05/08/22.   In a visit lasting 45 minutes, greater than 50% of the time was spent face to face discussing the patient's condition, in preparation for the discussion, and coordinating the patient's care.    The above documentation reflects my direct findings during this shared patient visit. Please see the separate note by Dr. Lisbeth Renshaw on this date for the remainder of the patient's plan of care.    Carola Rhine, [facility]   **Disclaimer: This note was dictated with voice recognition software. Similar sounding words can inadvertently be transcribed and this note may contain transcription errors which may not have been corrected upon publication of note.**

## 2022-05-01 ENCOUNTER — Telehealth: Payer: Self-pay | Admitting: Medical Oncology

## 2022-05-01 ENCOUNTER — Encounter: Payer: Self-pay | Admitting: Radiation Oncology

## 2022-05-01 ENCOUNTER — Ambulatory Visit
Admission: RE | Admit: 2022-05-01 | Discharge: 2022-05-01 | Disposition: A | Discharge status: Home / Self Care | Payer: Medicare Other | Source: Ambulatory Visit | Attending: Radiation Oncology | Admitting: Radiation Oncology

## 2022-05-01 VITALS — BP 137/70 | HR 95 | Temp 97.8°F | Resp 20 | Ht 69.0 in | Wt 172.2 lb

## 2022-05-01 DIAGNOSIS — C3412 Malignant neoplasm of upper lobe, left bronchus or lung: Secondary | ICD-10-CM | POA: Insufficient documentation

## 2022-05-01 DIAGNOSIS — M810 Age-related osteoporosis without current pathological fracture: Secondary | ICD-10-CM | POA: Diagnosis not present

## 2022-05-01 DIAGNOSIS — Z87891 Personal history of nicotine dependence: Secondary | ICD-10-CM | POA: Diagnosis not present

## 2022-05-01 DIAGNOSIS — I10 Essential (primary) hypertension: Secondary | ICD-10-CM | POA: Insufficient documentation

## 2022-05-01 DIAGNOSIS — Z79899 Other long term (current) drug therapy: Secondary | ICD-10-CM | POA: Diagnosis not present

## 2022-05-01 DIAGNOSIS — Z5111 Encounter for antineoplastic chemotherapy: Secondary | ICD-10-CM | POA: Insufficient documentation

## 2022-05-01 DIAGNOSIS — C3490 Malignant neoplasm of unspecified part of unspecified bronchus or lung: Secondary | ICD-10-CM

## 2022-05-01 DIAGNOSIS — Z510 Encounter for antineoplastic radiation therapy: Secondary | ICD-10-CM | POA: Diagnosis present

## 2022-05-01 DIAGNOSIS — E785 Hyperlipidemia, unspecified: Secondary | ICD-10-CM | POA: Diagnosis not present

## 2022-05-01 DIAGNOSIS — Z902 Acquired absence of lung [part of]: Secondary | ICD-10-CM | POA: Insufficient documentation

## 2022-05-01 DIAGNOSIS — E559 Vitamin D deficiency, unspecified: Secondary | ICD-10-CM | POA: Insufficient documentation

## 2022-05-01 DIAGNOSIS — Z923 Personal history of irradiation: Secondary | ICD-10-CM | POA: Insufficient documentation

## 2022-05-01 DIAGNOSIS — C3492 Malignant neoplasm of unspecified part of left bronchus or lung: Secondary | ICD-10-CM

## 2022-05-01 NOTE — Progress Notes (Signed)
Nursing interview for Adenocarcinoma of left lung, stage 1 ([place]). A diagnosis of Recurrent non-small cell lung cancer (NSCLC) ([place]) was also pertinent to this visit.  Patient identity verified. Patient states "Doing well." No discomfort reported at this time.  Meaningful use complete. Postmenopausal.  BP 137/70 (BP Location: Left Arm, Patient Position: Sitting, Cuff Size: Normal)   Pulse 95   Temp 97.8 F (36.6 C) (Temporal)   Resp 20   Ht '5\' 9"'$  (1.753 m)   Wt 172 lb 3.2 oz (78.1 kg)   SpO2 100%   BMI 25.43 kg/m   This concludes the interview.   Leandra Kern, LPN

## 2022-05-01 NOTE — Telephone Encounter (Signed)
Appts clarified.Marland Kitchen

## 2022-05-02 ENCOUNTER — Inpatient Hospital Stay: Payer: Medicare Other

## 2022-05-02 ENCOUNTER — Ambulatory Visit (HOSPITAL_COMMUNITY)
Admission: RE | Admit: 2022-05-02 | Discharge: 2022-05-02 | Disposition: A | Discharge status: Home / Self Care | Payer: Medicare Other | Source: Ambulatory Visit | Attending: Physician Assistant | Admitting: Physician Assistant

## 2022-05-02 ENCOUNTER — Other Ambulatory Visit: Payer: Self-pay

## 2022-05-02 DIAGNOSIS — C3490 Malignant neoplasm of unspecified part of unspecified bronchus or lung: Secondary | ICD-10-CM | POA: Insufficient documentation

## 2022-05-02 DIAGNOSIS — Z510 Encounter for antineoplastic radiation therapy: Secondary | ICD-10-CM | POA: Diagnosis not present

## 2022-05-02 LAB — CBC WITH DIFFERENTIAL (CANCER CENTER ONLY)
Abs Immature Granulocytes: 0.04 10*3/uL (ref 0.00–0.07)
Basophils Absolute: 0.1 10*3/uL (ref 0.0–0.1)
Basophils Relative: 1 %
Eosinophils Absolute: 0.2 10*3/uL (ref 0.0–0.5)
Eosinophils Relative: 2 %
HCT: 39.6 % (ref 36.0–46.0)
Hemoglobin: 13.6 g/dL (ref 12.0–15.0)
Immature Granulocytes: 0 %
Lymphocytes Relative: 24 %
Lymphs Abs: 2.3 10*3/uL (ref 0.7–4.0)
MCH: 31.8 pg (ref 26.0–34.0)
MCHC: 34.3 g/dL (ref 30.0–36.0)
MCV: 92.5 fL (ref 80.0–100.0)
Monocytes Absolute: 0.8 10*3/uL (ref 0.1–1.0)
Monocytes Relative: 8 %
Neutro Abs: 6.3 10*3/uL (ref 1.7–7.7)
Neutrophils Relative %: 65 %
Platelet Count: 221 10*3/uL (ref 150–400)
RBC: 4.28 MIL/uL (ref 3.87–5.11)
RDW: 13.2 % (ref 11.5–15.5)
WBC Count: 9.7 10*3/uL (ref 4.0–10.5)
nRBC: 0 % (ref 0.0–0.2)

## 2022-05-02 LAB — CMP (CANCER CENTER ONLY)
ALT: 23 U/L (ref 0–44)
AST: 26 U/L (ref 15–41)
Albumin: 4.8 g/dL (ref 3.5–5.0)
Alkaline Phosphatase: 100 U/L (ref 38–126)
Anion gap: 11 (ref 5–15)
BUN: 22 mg/dL (ref 8–23)
CO2: 27 mmol/L (ref 22–32)
Calcium: 9.2 mg/dL (ref 8.9–10.3)
Chloride: 100 mmol/L (ref 98–111)
Creatinine: 1.27 mg/dL — ABNORMAL HIGH (ref 0.44–1.00)
GFR, Estimated: 44 mL/min — ABNORMAL LOW (ref >60)
Glucose, Bld: 113 mg/dL — ABNORMAL HIGH (ref 70–99)
Potassium: 3.6 mmol/L (ref 3.5–5.1)
Sodium: 138 mmol/L (ref 135–145)
Total Bilirubin: 0.6 mg/dL (ref 0.3–1.2)
Total Protein: 7.7 g/dL (ref 6.5–8.1)

## 2022-05-02 MED ORDER — GADOBUTROL 1 MMOL/ML IV SOLN
7.5000 mL | Freq: Once | INTRAVENOUS | Status: AC | PRN
  Administered 2022-05-02: 7.5 mL via INTRAVENOUS

## 2022-05-07 ENCOUNTER — Inpatient Hospital Stay: Payer: Medicare Other

## 2022-05-07 ENCOUNTER — Inpatient Hospital Stay: Payer: Medicare Other | Admitting: Physician Assistant

## 2022-05-07 DIAGNOSIS — Z510 Encounter for antineoplastic radiation therapy: Secondary | ICD-10-CM | POA: Diagnosis not present

## 2022-05-07 MED FILL — Dexamethasone Sodium Phosphate Inj 100 MG/10ML: INTRAMUSCULAR | Qty: 1 | Status: AC

## 2022-05-08 ENCOUNTER — Other Ambulatory Visit: Payer: Self-pay

## 2022-05-08 ENCOUNTER — Ambulatory Visit
Admission: RE | Admit: 2022-05-08 | Discharge: 2022-05-08 | Disposition: A | Discharge status: Home / Self Care | Payer: Medicare Other | Source: Ambulatory Visit | Attending: Radiation Oncology | Admitting: Radiation Oncology

## 2022-05-08 ENCOUNTER — Encounter: Payer: Self-pay | Admitting: Medical Oncology

## 2022-05-08 ENCOUNTER — Telehealth: Payer: Self-pay | Admitting: Internal Medicine

## 2022-05-08 ENCOUNTER — Inpatient Hospital Stay (HOSPITAL_BASED_OUTPATIENT_CLINIC_OR_DEPARTMENT_OTHER): Payer: Medicare Other | Admitting: Internal Medicine

## 2022-05-08 ENCOUNTER — Inpatient Hospital Stay: Payer: Medicare Other

## 2022-05-08 ENCOUNTER — Encounter: Payer: Self-pay | Admitting: Internal Medicine

## 2022-05-08 VITALS — BP 134/78 | HR 67 | Temp 98.1°F | Resp 18

## 2022-05-08 VITALS — BP 154/67 | HR 79 | Temp 97.7°F | Resp 16 | Ht 69.0 in | Wt 173.3 lb

## 2022-05-08 DIAGNOSIS — C3490 Malignant neoplasm of unspecified part of unspecified bronchus or lung: Secondary | ICD-10-CM | POA: Diagnosis not present

## 2022-05-08 DIAGNOSIS — Z510 Encounter for antineoplastic radiation therapy: Secondary | ICD-10-CM | POA: Diagnosis not present

## 2022-05-08 LAB — RAD ONC ARIA SESSION SUMMARY
Course Elapsed Days: 0
Plan Fractions Treated to Date: 1
Plan Prescribed Dose Per Fraction: 2 Gy
Plan Total Fractions Prescribed: 30
Plan Total Prescribed Dose: 60 Gy
Reference Point Dosage Given to Date: 2 Gy
Reference Point Session Dosage Given: 2 Gy
Session Number: 1

## 2022-05-08 MED ORDER — SODIUM CHLORIDE 0.9 % IV SOLN
10.0000 mg | Freq: Once | INTRAVENOUS | Status: AC
  Filled 2022-05-08: qty 1

## 2022-05-08 MED ORDER — SODIUM CHLORIDE 0.9 % IV SOLN
45.0000 mg/m2 | Freq: Once | INTRAVENOUS | Status: AC
  Administered 2022-05-08: 90 mg via INTRAVENOUS
  Filled 2022-05-08: qty 15

## 2022-05-08 MED ORDER — DIPHENHYDRAMINE HCL 50 MG/ML IJ SOLN
50.0000 mg | Freq: Once | INTRAMUSCULAR | Status: AC
  Administered 2022-05-08: 50 mg via INTRAVENOUS
  Filled 2022-05-08: qty 1

## 2022-05-08 MED ORDER — SODIUM CHLORIDE 0.9 % IV SOLN
145.8000 mg | Freq: Once | INTRAVENOUS | Status: AC
  Administered 2022-05-08: 150 mg via INTRAVENOUS
  Filled 2022-05-08: qty 15

## 2022-05-08 MED ORDER — PALONOSETRON HCL INJECTION 0.25 MG/5ML
0.2500 mg | Freq: Once | INTRAVENOUS | Status: AC
  Administered 2022-05-08: 0.25 mg via INTRAVENOUS
  Filled 2022-05-08: qty 5

## 2022-05-08 MED ORDER — SODIUM CHLORIDE 0.9 % IV SOLN
10.0000 mg | Freq: Once | INTRAVENOUS | Status: DC
  Administered 2022-05-08: 10 mg via INTRAVENOUS
  Filled 2022-05-08: qty 10

## 2022-05-08 MED ORDER — SODIUM CHLORIDE 0.9 % IV SOLN: Freq: Once | INTRAVENOUS | Status: AC

## 2022-05-08 MED ORDER — FAMOTIDINE IN NACL 20-0.9 MG/50ML-% IV SOLN
20.0000 mg | Freq: Once | INTRAVENOUS | Status: AC
  Administered 2022-05-08: 20 mg via INTRAVENOUS
  Filled 2022-05-08: qty 50

## 2022-05-08 NOTE — Patient Instructions (Addendum)
[facility]  Discharge Instructions: Thank you for choosing [facility] to provide your oncology and hematology care.   If you have a lab appointment with the Cancer Center, please go directly to the Cancer Center and check in at the registration area.   Wear comfortable clothing and clothing appropriate for easy access to any Portacath or PICC line.   We strive to give you quality time with your provider. You may need to reschedule your appointment if you arrive late (15 or more minutes).  Arriving late affects you and other patients whose appointments are after yours.  Also, if you miss three or more appointments without notifying the office, you may be dismissed from the clinic at the provider's discretion.      For prescription refill requests, have your pharmacy contact our office and allow 72 hours for refills to be completed.    Today you received the following chemotherapy and/or immunotherapy agents: Paclitaxel, Carboplatin      To help prevent nausea and vomiting after your treatment, we encourage you to take your nausea medication as directed.  BELOW ARE SYMPTOMS THAT SHOULD BE REPORTED IMMEDIATELY: *FEVER GREATER THAN 100.4 F (38 C) OR HIGHER *CHILLS OR SWEATING *NAUSEA AND VOMITING THAT IS NOT CONTROLLED WITH YOUR NAUSEA MEDICATION *UNUSUAL SHORTNESS OF BREATH *UNUSUAL BRUISING OR BLEEDING *URINARY PROBLEMS (pain or burning when urinating, or frequent urination) *BOWEL PROBLEMS (unusual diarrhea, constipation, pain near the anus) TENDERNESS IN MOUTH AND THROAT WITH OR WITHOUT PRESENCE OF ULCERS (sore throat, sores in mouth, or a toothache) UNUSUAL RASH, SWELLING OR PAIN  UNUSUAL VAGINAL DISCHARGE OR ITCHING   Items with * indicate a potential emergency and should be followed up as soon as possible or go to the Emergency Department if any problems should occur.  Please show the CHEMOTHERAPY ALERT CARD or IMMUNOTHERAPY ALERT  CARD at check-in to the Emergency Department and triage nurse.  Should you have questions after your visit or need to cancel or reschedule your appointment, please contact [facility]  Dept: [phone]  and follow the prompts.  Office hours are 8:00 a.m. to 4:30 p.m. Monday - Friday. Please note that voicemails left after 4:00 p.m. may not be returned until the following business day.  We are closed weekends and major holidays. You have access to a nurse at all times for urgent questions. Please call the main number to the clinic Dept: [phone] and follow the prompts.   For any non-urgent questions, you may also contact your provider using MyChart. We now offer e-Visits for anyone 18 and older to request care online for non-urgent symptoms. For details visit [email or web address].   Also download the MyChart app! Go to the app store, search "MyChart", open the app, select [facility], and log in with your MyChart username and password.  Paclitaxel Injection What is this medication? PACLITAXEL (PAK li TAX el) treats some types of cancer. It works by slowing down the growth of cancer cells. This medicine may be used for other purposes; ask your health care provider or pharmacist if you have questions. COMMON BRAND NAME(S): Onxol, Taxol What should I tell my care team before I take this medication? They need to know if you have any of these conditions: Heart disease Liver disease Low white blood cell levels An unusual or allergic reaction to paclitaxel, other medications, foods, dyes, or preservatives If you or your partner are pregnant or trying to   get pregnant Breast-feeding How should I use this medication? This medication is injected into a vein. It is given by your care team in a hospital or clinic setting. Talk to your care team about the use of this medication in children. While it may be given to children for selected conditions, precautions  do apply. Overdosage: If you think you have taken too much of this medicine contact a poison control center or emergency room at once. NOTE: This medicine is only for you. Do not share this medicine with others. What if I miss a dose? Keep appointments for follow-up doses. It is important not to miss your dose. Call your care team if you are unable to keep an appointment. What may interact with this medication? Do not take this medication with any of the following: Live virus vaccines Other medications may affect the way this medication works. Talk with your care team about all of the medications you take. They may suggest changes to your treatment plan to lower the risk of side effects and to make sure your medications work as intended. This list may not describe all possible interactions. Give your health care provider a list of all the medicines, herbs, non-prescription drugs, or dietary supplements you use. Also tell them if you smoke, drink alcohol, or use illegal drugs. Some items may interact with your medicine. What should I watch for while using this medication? Your condition will be monitored carefully while you are receiving this medication. You may need blood work while taking this medication. This medication may make you feel generally unwell. This is not uncommon as chemotherapy can affect healthy cells as well as cancer cells. Report any side effects. Continue your course of treatment even though you feel ill unless your care team tells you to stop. This medication can cause serious allergic reactions. To reduce the risk, your care team may give you other medications to take before receiving this one. Be sure to follow the directions from your care team. This medication may increase your risk of getting an infection. Call your care team for advice if you get a fever, chills, sore throat, or other symptoms of a cold or flu. Do not treat yourself. Try to avoid being around people who are  sick. This medication may increase your risk to bruise or bleed. Call your care team if you notice any unusual bleeding. Be careful brushing or flossing your teeth or using a toothpick because you may get an infection or bleed more easily. If you have any dental work done, tell your dentist you are receiving this medication. Talk to your care team if you may be pregnant. Serious birth defects can occur if you take this medication during pregnancy. Talk to your care team before breastfeeding. Changes to your treatment plan may be needed. What side effects may I notice from receiving this medication? Side effects that you should report to your care team as soon as possible: Allergic reactions--skin rash, itching, hives, swelling of the face, lips, tongue, or throat Heart rhythm changes--fast or irregular heartbeat, dizziness, feeling faint or lightheaded, chest pain, trouble breathing Increase in blood pressure Infection--fever, chills, cough, sore throat, wounds that don't heal, pain or trouble when passing urine, general feeling of discomfort or being unwell Low blood pressure--dizziness, feeling faint or lightheaded, blurry vision Low red blood cell level--unusual weakness or fatigue, dizziness, headache, trouble breathing Painful swelling, warmth, or redness of the skin, blisters or sores at the infusion site Pain, tingling, or numbness   in the hands or feet Slow heartbeat--dizziness, feeling faint or lightheaded, confusion, trouble breathing, unusual weakness or fatigue Unusual bruising or bleeding Side effects that usually do not require medical attention (report to your care team if they continue or are bothersome): Diarrhea Hair loss Joint pain Loss of appetite Muscle pain Nausea Vomiting This list may not describe all possible side effects. Call your doctor for medical advice about side effects. You may report side effects to FDA at 1-800-FDA-1088. Where should I keep my  medication? This medication is given in a hospital or clinic. It will not be stored at home. NOTE: This sheet is a summary. It may not cover all possible information. If you have questions about this medicine, talk to your doctor, pharmacist, or health care provider.  2023 Elsevier/Gold Standard (2021-06-29 00:00:00)  Carboplatin Injection What is this medication? CARBOPLATIN (KAR boe pla tin) treats some types of cancer. It works by slowing down the growth of cancer cells. This medicine may be used for other purposes; ask your health care provider or pharmacist if you have questions. COMMON BRAND NAME(S): Paraplatin What should I tell my care team before I take this medication? They need to know if you have any of these conditions: Blood disorders Hearing problems Kidney disease Recent or ongoing radiation therapy An unusual or allergic reaction to carboplatin, cisplatin, other medications, foods, dyes, or preservatives Pregnant or trying to get pregnant Breast-feeding How should I use this medication? This medication is injected into a vein. It is given by your care team in a hospital or clinic setting. Talk to your care team about the use of this medication in children. Special care may be needed. Overdosage: If you think you have taken too much of this medicine contact a poison control center or emergency room at once. NOTE: This medicine is only for you. Do not share this medicine with others. What if I miss a dose? Keep appointments for follow-up doses. It is important not to miss your dose. Call your care team if you are unable to keep an appointment. What may interact with this medication? Medications for seizures Some antibiotics, such as amikacin, gentamicin, neomycin, streptomycin, tobramycin Vaccines This list may not describe all possible interactions. Give your health care provider a list of all the medicines, herbs, non-prescription drugs, or dietary supplements you use.  Also tell them if you smoke, drink alcohol, or use illegal drugs. Some items may interact with your medicine. What should I watch for while using this medication? Your condition will be monitored carefully while you are receiving this medication. You may need blood work while taking this medication. This medication may make you feel generally unwell. This is not uncommon, as chemotherapy can affect healthy cells as well as cancer cells. Report any side effects. Continue your course of treatment even though you feel ill unless your care team tells you to stop. In some cases, you may be given additional medications to help with side effects. Follow all directions for their use. This medication may increase your risk of getting an infection. Call your care team for advice if you get a fever, chills, sore throat, or other symptoms of a cold or flu. Do not treat yourself. Try to avoid being around people who are sick. Avoid taking medications that contain aspirin, acetaminophen, ibuprofen, naproxen, or ketoprofen unless instructed by your care team. These medications may hide a fever. Be careful brushing or flossing your teeth or using a toothpick because you may get an   infection or bleed more easily. If you have any dental work done, tell your dentist you are receiving this medication. Talk to your care team if you wish to become pregnant or think you might be pregnant. This medication can cause serious birth defects. Talk to your care team about effective forms of contraception. Do not breast-feed while taking this medication. What side effects may I notice from receiving this medication? Side effects that you should report to your care team as soon as possible: Allergic reactions--skin rash, itching, hives, swelling of the face, lips, tongue, or throat Infection--fever, chills, cough, sore throat, wounds that don't heal, pain or trouble when passing urine, general feeling of discomfort or being  unwell Low red blood cell level--unusual weakness or fatigue, dizziness, headache, trouble breathing Pain, tingling, or numbness in the hands or feet, muscle weakness, change in vision, confusion or trouble speaking, loss of balance or coordination, trouble walking, seizures Unusual bruising or bleeding Side effects that usually do not require medical attention (report to your care team if they continue or are bothersome): Hair loss Nausea Unusual weakness or fatigue Vomiting This list may not describe all possible side effects. Call your doctor for medical advice about side effects. You may report side effects to FDA at 1-800-FDA-1088. Where should I keep my medication? This medication is given in a hospital or clinic. It will not be stored at home. NOTE: This sheet is a summary. It may not cover all possible information. If you have questions about this medicine, talk to your doctor, pharmacist, or health care provider.  2023 Elsevier/Gold Standard (2007-04-05 00:00:00)  

## 2022-05-08 NOTE — Progress Notes (Signed)
[place] [phone]   [phone]  OFFICE PROGRESS NOTE  Beverley Fiedler, FNP [place] [id number]  DIAGNOSIS: Recurrent non-small cell lung cancer that was initially diagnosed as stage IB (T2 a, N0, M0) non-small cell lung cancer, adenocarcinoma diagnosed in September 2020. She is status post left upper lobectomy with lymph node dissection with tumor size of 3.2 cm and negative lymphadenopathy at that time. She had disease recurrence in July 2022 with anterior mediastinal lymphadenopathy which was consistent with metastatic non-small cell lung cancer.    Biomarker Findings Tumor Mutational Burden - 21 Muts/Mb Microsatellite status - MS-Stable Genomic Findings For a complete list of the genes assayed, please refer to the Appendix. KRAS G12V TP53 F134L 7 Disease relevant genes with no reportable alterations: ALK, BRAF, EGFR, ERBB2, MET, RET, ROS1   PDL1: 0%   PRIOR THERAPY: 1)  Left upper lobectomy under the care of Dr. Kipp Brood in November 2020. 2) SBRT to the disease recurrence in the anterior mediastinal lymphadenopathy under the care of Dr. Lisbeth Renshaw. Last treatment 12/16/20.   CURRENT THERAPY: Concurrent chemoradiation with carboplatin for an AUC of 2 and Taxol 45 mg/m2. First dose expected on 05/08/2022    INTERVAL HISTORY: Jennifer [name] 77 y.o. female returns to the clinic today for follow-up visit accompanied by her daughter Jennifer [name].  The patient is feeling fine today with no concerning complaints but very concerned about starting chemoradiation.  She denied having any current chest pain, shortness of breath, cough or hemoptysis.  She has no nausea, vomiting, diarrhea or constipation.  She has no headache or visual changes.  She denied having any significant weight loss or night sweats.  She has no fever or chills.  She is here today to start the first cycle of concurrent chemoradiation.  She had MRI of the brain that was  negative for metastatic disease to the brain.  MEDICAL HISTORY: Past Medical History:  Diagnosis Date   Cancer ([place])    Lung cancer   History of chicken pox    Hyperlipidemia    Hypertension    Menopause    Osteoporosis    Pneumonia    "walking" pneumonia   Pre-diabetes    Vitamin D deficiency     ALLERGIES:  is allergic to aspirin and latex.  MEDICATIONS:  Current Outpatient Medications  Medication Sig Dispense Refill   atorvastatin (LIPITOR) 40 MG tablet Take 40 mg by mouth every evening.      diltiazem (DILACOR XR) 240 MG 24 hr capsule Take 240 mg by mouth daily.     guaiFENesin (MUCINEX) 600 MG 12 hr tablet Take 1 tablet (600 mg total) by mouth 2 (two) times daily. (Patient taking differently: Take 600 mg by mouth daily.)     metoprolol succinate (TOPROL-XL) 50 MG 24 hr tablet Take 50 mg by mouth daily.     Multiple Vitamin (MULTIVITAMIN WITH MINERALS) TABS tablet Take 1 tablet by mouth daily.     prochlorperazine (COMPAZINE) 10 MG tablet Take 1 tablet (10 mg total) by mouth every 6 (six) hours as needed. 30 tablet 2   triamterene-hydrochlorothiazide (MAXZIDE-25) 37.5-25 MG tablet Take 1 tablet by mouth daily.     No current facility-administered medications for this visit.    SURGICAL HISTORY:  Past Surgical History:  Procedure Laterality Date   CHEST TUBE INSERTION Left 01/19/2019   Procedure: Chest Tube Insertion;  Surgeon: Lajuana Matte, MD;  Location: [facility];  Service:  Thoracic;  Laterality: Left;   PLEURADESIS N/A 01/06/2019   Procedure: Pleuradesis - Chemical;  Surgeon: Lajuana Matte, MD;  Location: [facility];  Service: Thoracic;  Laterality: N/A;   PLEURADESIS Left 01/19/2019   Procedure: Mechanical Pleuradesis;  Surgeon: Lajuana Matte, MD;  Location: [facility];  Service: Thoracic;  Laterality: Left;   VIDEO ASSISTED THORACOSCOPY Left 01/19/2019   Procedure: VIDEO ASSISTED THORACOSCOPY;  Surgeon: Lajuana Matte, MD;  Location: [facility];  Service:  Thoracic;  Laterality: Left;   VIDEO ASSISTED THORACOSCOPY (VATS)/ LOBECTOMY Left 12/29/2018   Procedure: VIDEO ASSISTED THORACOSCOPY (VATS)/LEFT UPPER  LOBECTOMY with Mediastinal lymph node exploration.;  Surgeon: Lajuana Matte, MD;  Location: [facility];  Service: Thoracic;  Laterality: Left;   VIDEO BRONCHOSCOPY N/A 12/29/2018   Procedure: VIDEO BRONCHOSCOPY;  Surgeon: Lajuana Matte, MD;  Location: [facility];  Service: Thoracic;  Laterality: N/A;   [place] N/A 12/09/2018   Procedure: VIDEO BRONCHOSCOPY WITH ENDOBRONCHIAL NAVIGATION;  Surgeon: Lajuana Matte, MD;  Location: [facility];  Service: Thoracic;  Laterality: N/A;   VIDEO BRONCHOSCOPY WITH INSERTION OF INTERBRONCHIAL VALVE (IBV) N/A 01/06/2019   Procedure: VIDEO BRONCHOSCOPY WITH INSERTION OF INTERBRONCHIAL VALVE (IBV);  Surgeon: Lajuana Matte, MD;  Location: [facility];  Service: Thoracic;  Laterality: N/A;   VIDEO BRONCHOSCOPY WITH INSERTION OF INTERBRONCHIAL VALVE (IBV) N/A 01/13/2019   Procedure: VIDEO BRONCHOSCOPY WITH INSERTION OF INTERBRONCHIAL VALVE (IBV);  Surgeon: Lajuana Matte, MD;  Location: [facility];  Service: Thoracic;  Laterality: N/A;   VIDEO BRONCHOSCOPY WITH INSERTION OF INTERBRONCHIAL VALVE (IBV) N/A 02/23/2019   Procedure: VIDEO BRONCHOSCOPY WITH REMOVAL OF INTERBRONCHIAL VALVE (IBV);  Surgeon: Lajuana Matte, MD;  Location: [facility];  Service: Thoracic;  Laterality: N/A;    REVIEW OF SYSTEMS:  A comprehensive review of systems was negative except for: Behavioral/Psych: positive for anxiety   PHYSICAL EXAMINATION: General appearance: alert, cooperative, and no distress Head: Normocephalic, without obvious abnormality, atraumatic Neck: no adenopathy, no JVD, supple, symmetrical, trachea midline, and thyroid not enlarged, symmetric, no tenderness/mass/nodules Lymph nodes: Cervical, supraclavicular, and axillary nodes normal. Resp: clear to auscultation  bilaterally Back: symmetric, no curvature. ROM normal. No CVA tenderness. Cardio: regular rate and rhythm, S1, S2 normal, no murmur, click, rub or gallop GI: soft, non-tender; bowel sounds normal; no masses,  no organomegaly Extremities: extremities normal, atraumatic, no cyanosis or edema  ECOG PERFORMANCE STATUS: 1 - Symptomatic but completely ambulatory  Blood pressure (!) 154/67, pulse 79, temperature 97.7 F (36.5 C), temperature source Temporal, resp. rate 16, height '5\' 9"'$  (1.753 m), weight 173 lb 4.8 oz (78.6 kg), SpO2 100 %.  LABORATORY DATA: Lab Results  Component Value Date   WBC 9.7 05/02/2022   HGB 13.6 05/02/2022   HCT 39.6 05/02/2022   MCV 92.5 05/02/2022   PLT 221 05/02/2022      Chemistry      Component Value Date/Time   NA 138 05/02/2022 1510   K 3.6 05/02/2022 1510   CL 100 05/02/2022 1510   CO2 27 05/02/2022 1510   BUN 22 05/02/2022 1510   CREATININE 1.27 (H) 05/02/2022 1510   GLU 845 10/28/2009 0000      Component Value Date/Time   CALCIUM 9.2 05/02/2022 1510   ALKPHOS 100 05/02/2022 1510   AST 26 05/02/2022 1510   ALT 23 05/02/2022 1510   BILITOT 0.6 05/02/2022 1510       RADIOGRAPHIC STUDIES: MR Brain W Wo Contrast  Result  Date: 05/03/2022 CLINICAL DATA:  Metastatic disease evaluation. History of lung cancer. EXAM: MRI HEAD WITHOUT AND WITH CONTRAST TECHNIQUE: Multiplanar, multiecho pulse sequences of the brain and surrounding structures were obtained without and with intravenous contrast. CONTRAST:  7.21m GADAVIST GADOBUTROL 1 MMOL/ML IV SOLN COMPARISON:  CT head 02/21/2021 FINDINGS: Brain: There is no acute intracranial hemorrhage, extra-axial fluid collection, or acute infarct Parenchymal volume is normal for age. The ventricles are normal in size. Patchy FLAIR signal abnormality throughout the supratentorial white matter and pons likely reflects sequela of moderate chronic small-vessel ischemic change. There is no suspicious parenchymal signal  abnormality, mass lesion, or abnormal enhancement. There is no mass effect or midline shift. Vascular: Normal flow voids. Skull and upper cervical spine: Normal marrow signal. Sinuses/Orbits: The paranasal sinuses are clear. The globes and orbits are unremarkable. Other: None. IMPRESSION: No evidence of intracranial metastatic disease. Electronically Signed   By: PValetta MoleM.D.   On: 05/03/2022 12:52   NM PET Image Restage (PS) Skull Base to Thigh (F-18 FDG)  Result Date: 04/15/2022 CLINICAL DATA:  Subsequent treatment strategy for non-small-cell lung cancer. EXAM: NUCLEAR MEDICINE PET SKULL BASE TO THIGH TECHNIQUE: 8.6 mCi F-18 FDG was injected intravenously. Full-ring PET imaging was performed from the skull base to thigh after the radiotracer. CT data was obtained and used for attenuation correction and anatomic localization. Fasting blood glucose: 116 mg/dl COMPARISON:  Chest CT 03/23/2022.  PET-CT 10/14/2020 FINDINGS: Mediastinal blood pool activity: SUV max 2.8 Liver activity: SUV max NA NECK: No hypermetabolic lymph nodes in the neck. Incidental CT findings: None. CHEST: AP window lymph node of concern on recent diagnostic chest CT is stable at 14 mm short axis. This lymph node is markedly hypermetabolic with SUV max = 10000000 No other hypermetabolic lymphadenopathy in the chest. There is a 7 mm short axis precarinal lymph node on image 71/4 without hypermetabolism ( SUV max = 2.8). There is no hypermetabolic pulmonary nodule or mass. Incidental CT findings: Centrilobular emphsyema noted. Volume loss in the left hemithorax with surgical changes in the hilum, consistent with previous left upper lobectomy. The tiny (3 mm) nodule seen on previous diagnostic chest CT are not readily evident on today's non breath hold CT imaging for attenuation correction. Mild atherosclerotic calcification is noted in the wall of the thoracic aorta. ABDOMEN/PELVIS: No abnormal hypermetabolic activity within the liver,  pancreas, adrenal glands, or spleen. No hypermetabolic lymph nodes in the abdomen or pelvis. Incidental CT findings: There is moderate atherosclerotic calcification of the abdominal aorta without aneurysm. Diverticular changes are noted in the left colon without evidence of diverticulitis. SKELETON: No focal hypermetabolic activity to suggest skeletal metastasis. Incidental CT findings: No worrisome lytic or sclerotic osseous abnormality. IMPRESSION: 1. 14 mm short axis AP window lymph node of concern on recent diagnostic chest CT is markedly hypermetabolic, consistent with metastatic disease. 2. No other sites of hypermetabolic disease in the chest, abdomen, or pelvis on today's study. 3. Tiny pulmonary nodules seen on previous diagnostic chest CT are not readily evident on today's non breath hold CT imaging for attenuation correction. 4.  Aortic Atherosclerosis (ICD10-I70.0). Electronically Signed   By: EMisty StanleyM.D.   On: 04/15/2022 06:44    ASSESSMENT AND PLAN: This is a very pleasant 77years old white female with recurrent non-small cell lung cancer that was initially diagnosed as stage Ib (T2 a, N0, M0) adenocarcinoma diagnosed in September 2020 status post left upper lobectomy with lymph node dissection with disease recurrence in  July 2022 presenting with anterior mediastinal lymphadenopathy.  The patient is status post curative radiotherapy to this area under the care of Dr. Lisbeth Renshaw completed December 16, 2020.   She was found to have another evidence of disease recurrence in the AP window lymph nodes in February 2024.  She has brain MRI that showed no evidence of metastatic disease to the brain. She is currently on concurrent chemoradiation with weekly carboplatin for AUC of 2 and paclitaxel 45 Mg/M2.  First dose May 08, 2022.  The patient is feeling fine today with no concerning complaints and she will proceed with her treatment today as planned. I will see her back for follow-up visit in 2 weeks  for evaluation with the start of cycle #3. She was advised to call immediately if she has any other concerning symptoms in the interval. The patient voices understanding of current disease status and treatment options and is in agreement with the current care plan.  All questions were answered. The patient knows to call the clinic with any problems, questions or concerns. We can certainly see the patient much sooner if necessary.  The total time spent in the appointment was 20 minutes.  Disclaimer: This note was dictated with voice recognition software. Similar sounding words can inadvertently be transcribed and may not be corrected upon review.

## 2022-05-08 NOTE — Telephone Encounter (Signed)
Scheduled per 03/11 los, patient has been called and notified of upcoming appointments.

## 2022-05-08 NOTE — Progress Notes (Signed)
Patient seen by MD today  Vitals are within treatment parameters.  Labs reviewed: No labs ordered for today . Per  Dr. [name] ,it is ok to use labs from 05/02/22 and to treat pt today with Carboplatin and taxol.  Per physician team, patient is ready for treatment and there are NO modifications to the treatment plan.

## 2022-05-09 ENCOUNTER — Telehealth: Payer: Self-pay

## 2022-05-09 ENCOUNTER — Ambulatory Visit
Admission: RE | Admit: 2022-05-09 | Discharge: 2022-05-09 | Disposition: A | Discharge status: Home / Self Care | Payer: Medicare Other | Source: Ambulatory Visit | Attending: Radiation Oncology | Admitting: Radiation Oncology

## 2022-05-09 ENCOUNTER — Other Ambulatory Visit: Payer: Medicare Other

## 2022-05-09 ENCOUNTER — Other Ambulatory Visit: Payer: Self-pay

## 2022-05-09 DIAGNOSIS — Z510 Encounter for antineoplastic radiation therapy: Secondary | ICD-10-CM | POA: Diagnosis not present

## 2022-05-09 LAB — RAD ONC ARIA SESSION SUMMARY
Course Elapsed Days: 1
Plan Fractions Treated to Date: 2
Plan Prescribed Dose Per Fraction: 2 Gy
Plan Total Fractions Prescribed: 30
Plan Total Prescribed Dose: 60 Gy
Reference Point Dosage Given to Date: 4 Gy
Reference Point Session Dosage Given: 2 Gy
Session Number: 2

## 2022-05-09 NOTE — Telephone Encounter (Signed)
-----   Message from Angie Fava, RN sent at 05/08/2022  2:25 PM EDT ----- Regarding: Dr. [name] - first time chemo f/u call - pt tolerated well

## 2022-05-09 NOTE — Telephone Encounter (Signed)
Jennifer [name] states that she is doing fine. She is eating, drinking, and urinating well. She knows to call the office at [phone] if she has any questions or concerns.

## 2022-05-10 ENCOUNTER — Other Ambulatory Visit: Payer: Self-pay

## 2022-05-10 ENCOUNTER — Ambulatory Visit
Admission: RE | Admit: 2022-05-10 | Discharge: 2022-05-10 | Disposition: A | Discharge status: Home / Self Care | Payer: Medicare Other | Source: Ambulatory Visit | Attending: Radiation Oncology | Admitting: Radiation Oncology

## 2022-05-10 DIAGNOSIS — Z510 Encounter for antineoplastic radiation therapy: Secondary | ICD-10-CM | POA: Diagnosis not present

## 2022-05-10 LAB — RAD ONC ARIA SESSION SUMMARY
Course Elapsed Days: 2
Plan Fractions Treated to Date: 3
Plan Prescribed Dose Per Fraction: 2 Gy
Plan Total Fractions Prescribed: 30
Plan Total Prescribed Dose: 60 Gy
Reference Point Dosage Given to Date: 6 Gy
Reference Point Session Dosage Given: 2 Gy
Session Number: 3

## 2022-05-11 ENCOUNTER — Ambulatory Visit
Admission: RE | Admit: 2022-05-11 | Discharge: 2022-05-11 | Disposition: A | Discharge status: Home / Self Care | Payer: Medicare Other | Source: Ambulatory Visit | Attending: Radiation Oncology | Admitting: Radiation Oncology

## 2022-05-11 ENCOUNTER — Other Ambulatory Visit: Payer: Self-pay

## 2022-05-11 DIAGNOSIS — C3492 Malignant neoplasm of unspecified part of left bronchus or lung: Secondary | ICD-10-CM

## 2022-05-11 DIAGNOSIS — Z510 Encounter for antineoplastic radiation therapy: Secondary | ICD-10-CM | POA: Diagnosis not present

## 2022-05-11 LAB — RAD ONC ARIA SESSION SUMMARY
Course Elapsed Days: 3
Plan Fractions Treated to Date: 4
Plan Prescribed Dose Per Fraction: 2 Gy
Plan Total Fractions Prescribed: 30
Plan Total Prescribed Dose: 60 Gy
Reference Point Dosage Given to Date: 8 Gy
Reference Point Session Dosage Given: 2 Gy
Session Number: 4

## 2022-05-11 MED ORDER — SONAFINE EX EMUL
1.0000 | Freq: Two times a day (BID) | CUTANEOUS | Status: DC
  Administered 2022-05-11: 1 via TOPICAL

## 2022-05-11 MED FILL — Dexamethasone Sodium Phosphate Inj 100 MG/10ML: INTRAMUSCULAR | Qty: 1 | Status: AC

## 2022-05-11 NOTE — Progress Notes (Signed)
Pt here for patient teaching. Pt given Radiation and You booklet, skin care instructions, and Sonafine. Reviewed areas of pertinence such as fatigue, hair loss, nausea and vomiting, skin changes, throat changes, breast tenderness, breast swelling, cough, shortness of breath, earaches, and taste changes. Pt able to give teach back of to pat skin, use unscented/gentle soap, and drink plenty of water, apply Sonafine bid, avoid applying anything to skin within 4 hours of treatment, avoid wearing an under wire bra, and to use an electric razor if they must shave. Pt verbalizes understanding of information given and will contact nursing with any questions or concerns.     [email or web address]

## 2022-05-14 ENCOUNTER — Inpatient Hospital Stay: Payer: Medicare Other

## 2022-05-14 ENCOUNTER — Other Ambulatory Visit: Payer: Self-pay

## 2022-05-14 ENCOUNTER — Ambulatory Visit
Admission: RE | Admit: 2022-05-14 | Discharge: 2022-05-14 | Disposition: A | Discharge status: Home / Self Care | Payer: Medicare Other | Source: Ambulatory Visit | Attending: Radiation Oncology | Admitting: Radiation Oncology

## 2022-05-14 VITALS — BP 127/80 | HR 64 | Temp 97.7°F | Resp 17 | Wt 170.8 lb

## 2022-05-14 DIAGNOSIS — C3490 Malignant neoplasm of unspecified part of unspecified bronchus or lung: Secondary | ICD-10-CM

## 2022-05-14 DIAGNOSIS — Z510 Encounter for antineoplastic radiation therapy: Secondary | ICD-10-CM | POA: Diagnosis not present

## 2022-05-14 LAB — RAD ONC ARIA SESSION SUMMARY
Course Elapsed Days: 6
Plan Fractions Treated to Date: 5
Plan Prescribed Dose Per Fraction: 2 Gy
Plan Total Fractions Prescribed: 30
Plan Total Prescribed Dose: 60 Gy
Reference Point Dosage Given to Date: 10 Gy
Reference Point Session Dosage Given: 2 Gy
Session Number: 5

## 2022-05-14 LAB — CMP (CANCER CENTER ONLY)
ALT: 24 U/L (ref 0–44)
AST: 20 U/L (ref 15–41)
Albumin: 4.6 g/dL (ref 3.5–5.0)
Alkaline Phosphatase: 101 U/L (ref 38–126)
Anion gap: 7 (ref 5–15)
BUN: 20 mg/dL (ref 8–23)
CO2: 32 mmol/L (ref 22–32)
Calcium: 10.1 mg/dL (ref 8.9–10.3)
Chloride: 100 mmol/L (ref 98–111)
Creatinine: 1.08 mg/dL — ABNORMAL HIGH (ref 0.44–1.00)
GFR, Estimated: 54 mL/min — ABNORMAL LOW (ref >60)
Glucose, Bld: 99 mg/dL (ref 70–99)
Potassium: 4.9 mmol/L (ref 3.5–5.1)
Sodium: 139 mmol/L (ref 135–145)
Total Bilirubin: 1 mg/dL (ref 0.3–1.2)
Total Protein: 7.5 g/dL (ref 6.5–8.1)

## 2022-05-14 LAB — CBC WITH DIFFERENTIAL (CANCER CENTER ONLY)
Abs Immature Granulocytes: 0.03 10*3/uL (ref 0.00–0.07)
Basophils Absolute: 0 10*3/uL (ref 0.0–0.1)
Basophils Relative: 1 %
Eosinophils Absolute: 0.1 10*3/uL (ref 0.0–0.5)
Eosinophils Relative: 2 %
HCT: 39.4 % (ref 36.0–46.0)
Hemoglobin: 13.5 g/dL (ref 12.0–15.0)
Immature Granulocytes: 0 %
Lymphocytes Relative: 23 %
Lymphs Abs: 1.7 10*3/uL (ref 0.7–4.0)
MCH: 31.5 pg (ref 26.0–34.0)
MCHC: 34.3 g/dL (ref 30.0–36.0)
MCV: 91.8 fL (ref 80.0–100.0)
Monocytes Absolute: 0.4 10*3/uL (ref 0.1–1.0)
Monocytes Relative: 5 %
Neutro Abs: 5.2 10*3/uL (ref 1.7–7.7)
Neutrophils Relative %: 69 %
Platelet Count: 228 10*3/uL (ref 150–400)
RBC: 4.29 MIL/uL (ref 3.87–5.11)
RDW: 12.6 % (ref 11.5–15.5)
WBC Count: 7.4 10*3/uL (ref 4.0–10.5)
nRBC: 0 % (ref 0.0–0.2)

## 2022-05-14 MED ORDER — FAMOTIDINE IN NACL 20-0.9 MG/50ML-% IV SOLN
20.0000 mg | Freq: Once | INTRAVENOUS | Status: AC
  Administered 2022-05-14: 20 mg via INTRAVENOUS
  Filled 2022-05-14: qty 50

## 2022-05-14 MED ORDER — DIPHENHYDRAMINE HCL 50 MG/ML IJ SOLN
50.0000 mg | Freq: Once | INTRAMUSCULAR | Status: AC
  Administered 2022-05-14: 50 mg via INTRAVENOUS
  Filled 2022-05-14: qty 1

## 2022-05-14 MED ORDER — PALONOSETRON HCL INJECTION 0.25 MG/5ML
0.2500 mg | Freq: Once | INTRAVENOUS | Status: AC
  Administered 2022-05-14: 0.25 mg via INTRAVENOUS
  Filled 2022-05-14: qty 5

## 2022-05-14 MED ORDER — SODIUM CHLORIDE 0.9 % IV SOLN
45.0000 mg/m2 | Freq: Once | INTRAVENOUS | Status: AC
  Administered 2022-05-14: 90 mg via INTRAVENOUS
  Filled 2022-05-14: qty 15

## 2022-05-14 MED ORDER — SODIUM CHLORIDE 0.9 % IV SOLN
145.8000 mg | Freq: Once | INTRAVENOUS | Status: AC
  Administered 2022-05-14: 150 mg via INTRAVENOUS
  Filled 2022-05-14: qty 15

## 2022-05-14 MED ORDER — SODIUM CHLORIDE 0.9 % IV SOLN
10.0000 mg | Freq: Once | INTRAVENOUS | Status: AC
  Administered 2022-05-14: 10 mg via INTRAVENOUS
  Filled 2022-05-14: qty 10

## 2022-05-14 MED ORDER — SODIUM CHLORIDE 0.9 % IV SOLN: Freq: Once | INTRAVENOUS | Status: AC

## 2022-05-14 NOTE — Patient Instructions (Signed)
[facility]  Discharge Instructions: Thank you for choosing [facility] to provide your oncology and hematology care.   If you have a lab appointment with the Cancer Center, please go directly to the Cancer Center and check in at the registration area.   Wear comfortable clothing and clothing appropriate for easy access to any Portacath or PICC line.   We strive to give you quality time with your provider. You may need to reschedule your appointment if you arrive late (15 or more minutes).  Arriving late affects you and other patients whose appointments are after yours.  Also, if you miss three or more appointments without notifying the office, you may be dismissed from the clinic at the provider's discretion.      For prescription refill requests, have your pharmacy contact our office and allow 72 hours for refills to be completed.    Today you received the following chemotherapy and/or immunotherapy agents: Paclitaxel and Carboplatin      To help prevent nausea and vomiting after your treatment, we encourage you to take your nausea medication as directed.  BELOW ARE SYMPTOMS THAT SHOULD BE REPORTED IMMEDIATELY: *FEVER GREATER THAN 100.4 F (38 C) OR HIGHER *CHILLS OR SWEATING *NAUSEA AND VOMITING THAT IS NOT CONTROLLED WITH YOUR NAUSEA MEDICATION *UNUSUAL SHORTNESS OF BREATH *UNUSUAL BRUISING OR BLEEDING *URINARY PROBLEMS (pain or burning when urinating, or frequent urination) *BOWEL PROBLEMS (unusual diarrhea, constipation, pain near the anus) TENDERNESS IN MOUTH AND THROAT WITH OR WITHOUT PRESENCE OF ULCERS (sore throat, sores in mouth, or a toothache) UNUSUAL RASH, SWELLING OR PAIN  UNUSUAL VAGINAL DISCHARGE OR ITCHING   Items with * indicate a potential emergency and should be followed up as soon as possible or go to the Emergency Department if any problems should occur.  Please show the CHEMOTHERAPY ALERT CARD or IMMUNOTHERAPY  ALERT CARD at check-in to the Emergency Department and triage nurse.  Should you have questions after your visit or need to cancel or reschedule your appointment, please contact [facility]  Dept: [phone]  and follow the prompts.  Office hours are 8:00 a.m. to 4:30 p.m. Monday - Friday. Please note that voicemails left after 4:00 p.m. may not be returned until the following business day.  We are closed weekends and major holidays. You have access to a nurse at all times for urgent questions. Please call the main number to the clinic Dept: [phone] and follow the prompts.   For any non-urgent questions, you may also contact your provider using MyChart. We now offer e-Visits for anyone 18 and older to request care online for non-urgent symptoms. For details visit [email or web address].   Also download the MyChart app! Go to the app store, search "MyChart", open the app, select , and log in with your MyChart username and password.   

## 2022-05-15 ENCOUNTER — Other Ambulatory Visit: Payer: Self-pay

## 2022-05-15 ENCOUNTER — Ambulatory Visit
Admission: RE | Admit: 2022-05-15 | Discharge: 2022-05-15 | Disposition: A | Discharge status: Home / Self Care | Payer: Medicare Other | Source: Ambulatory Visit | Attending: Radiation Oncology | Admitting: Radiation Oncology

## 2022-05-15 DIAGNOSIS — Z510 Encounter for antineoplastic radiation therapy: Secondary | ICD-10-CM | POA: Diagnosis not present

## 2022-05-15 LAB — RAD ONC ARIA SESSION SUMMARY
Course Elapsed Days: 7
Plan Fractions Treated to Date: 6
Plan Prescribed Dose Per Fraction: 2 Gy
Plan Total Fractions Prescribed: 30
Plan Total Prescribed Dose: 60 Gy
Reference Point Dosage Given to Date: 12 Gy
Reference Point Session Dosage Given: 2 Gy
Session Number: 6

## 2022-05-16 ENCOUNTER — Other Ambulatory Visit: Payer: Self-pay

## 2022-05-16 ENCOUNTER — Ambulatory Visit
Admission: RE | Admit: 2022-05-16 | Discharge: 2022-05-16 | Disposition: A | Discharge status: Home / Self Care | Payer: Medicare Other | Source: Ambulatory Visit | Attending: Radiation Oncology | Admitting: Radiation Oncology

## 2022-05-16 DIAGNOSIS — Z510 Encounter for antineoplastic radiation therapy: Secondary | ICD-10-CM | POA: Diagnosis not present

## 2022-05-16 LAB — RAD ONC ARIA SESSION SUMMARY
Course Elapsed Days: 8
Plan Fractions Treated to Date: 7
Plan Prescribed Dose Per Fraction: 2 Gy
Plan Total Fractions Prescribed: 30
Plan Total Prescribed Dose: 60 Gy
Reference Point Dosage Given to Date: 14 Gy
Reference Point Session Dosage Given: 2 Gy
Session Number: 7

## 2022-05-17 ENCOUNTER — Other Ambulatory Visit: Payer: Self-pay

## 2022-05-17 ENCOUNTER — Ambulatory Visit
Admission: RE | Admit: 2022-05-17 | Discharge: 2022-05-17 | Disposition: A | Discharge status: Home / Self Care | Payer: Medicare Other | Source: Ambulatory Visit | Attending: Radiation Oncology | Admitting: Radiation Oncology

## 2022-05-17 DIAGNOSIS — Z510 Encounter for antineoplastic radiation therapy: Secondary | ICD-10-CM | POA: Diagnosis not present

## 2022-05-17 LAB — RAD ONC ARIA SESSION SUMMARY
Course Elapsed Days: 9
Plan Fractions Treated to Date: 8
Plan Prescribed Dose Per Fraction: 2 Gy
Plan Total Fractions Prescribed: 30
Plan Total Prescribed Dose: 60 Gy
Reference Point Dosage Given to Date: 16 Gy
Reference Point Session Dosage Given: 2 Gy
Session Number: 8

## 2022-05-18 ENCOUNTER — Ambulatory Visit
Admission: RE | Admit: 2022-05-18 | Discharge: 2022-05-18 | Disposition: A | Discharge status: Home / Self Care | Payer: Medicare Other | Source: Ambulatory Visit | Attending: Radiation Oncology | Admitting: Radiation Oncology

## 2022-05-18 ENCOUNTER — Other Ambulatory Visit: Payer: Self-pay

## 2022-05-18 ENCOUNTER — Ambulatory Visit: Payer: Medicare Other

## 2022-05-18 DIAGNOSIS — Z510 Encounter for antineoplastic radiation therapy: Secondary | ICD-10-CM | POA: Diagnosis not present

## 2022-05-18 LAB — RAD ONC ARIA SESSION SUMMARY
Course Elapsed Days: 10
Plan Fractions Treated to Date: 9
Plan Prescribed Dose Per Fraction: 2 Gy
Plan Total Fractions Prescribed: 30
Plan Total Prescribed Dose: 60 Gy
Reference Point Dosage Given to Date: 18 Gy
Reference Point Session Dosage Given: 2 Gy
Session Number: 9

## 2022-05-18 MED FILL — Dexamethasone Sodium Phosphate Inj 100 MG/10ML: INTRAMUSCULAR | Qty: 1 | Status: AC

## 2022-05-21 ENCOUNTER — Inpatient Hospital Stay: Payer: Medicare Other

## 2022-05-21 ENCOUNTER — Encounter: Payer: Self-pay | Admitting: Internal Medicine

## 2022-05-21 ENCOUNTER — Ambulatory Visit
Admission: RE | Admit: 2022-05-21 | Discharge: 2022-05-21 | Disposition: A | Discharge status: Home / Self Care | Payer: Medicare Other | Source: Ambulatory Visit | Attending: Radiation Oncology | Admitting: Radiation Oncology

## 2022-05-21 ENCOUNTER — Inpatient Hospital Stay (HOSPITAL_BASED_OUTPATIENT_CLINIC_OR_DEPARTMENT_OTHER): Payer: Medicare Other | Admitting: Internal Medicine

## 2022-05-21 ENCOUNTER — Other Ambulatory Visit: Payer: Self-pay

## 2022-05-21 VITALS — BP 118/65 | HR 64 | Temp 97.9°F | Resp 18

## 2022-05-21 DIAGNOSIS — C3490 Malignant neoplasm of unspecified part of unspecified bronchus or lung: Secondary | ICD-10-CM

## 2022-05-21 DIAGNOSIS — Z510 Encounter for antineoplastic radiation therapy: Secondary | ICD-10-CM | POA: Diagnosis not present

## 2022-05-21 LAB — CMP (CANCER CENTER ONLY)
ALT: 24 U/L (ref 0–44)
AST: 20 U/L (ref 15–41)
Albumin: 4.2 g/dL (ref 3.5–5.0)
Alkaline Phosphatase: 93 U/L (ref 38–126)
Anion gap: 8 (ref 5–15)
BUN: 17 mg/dL (ref 8–23)
CO2: 29 mmol/L (ref 22–32)
Calcium: 9.3 mg/dL (ref 8.9–10.3)
Chloride: 101 mmol/L (ref 98–111)
Creatinine: 1.06 mg/dL — ABNORMAL HIGH (ref 0.44–1.00)
GFR, Estimated: 55 mL/min — ABNORMAL LOW (ref >60)
Glucose, Bld: 118 mg/dL — ABNORMAL HIGH (ref 70–99)
Potassium: 3.6 mmol/L (ref 3.5–5.1)
Sodium: 138 mmol/L (ref 135–145)
Total Bilirubin: 0.5 mg/dL (ref 0.3–1.2)
Total Protein: 6.6 g/dL (ref 6.5–8.1)

## 2022-05-21 LAB — RAD ONC ARIA SESSION SUMMARY
Course Elapsed Days: 13
Plan Fractions Treated to Date: 10
Plan Prescribed Dose Per Fraction: 2 Gy
Plan Total Fractions Prescribed: 30
Plan Total Prescribed Dose: 60 Gy
Reference Point Dosage Given to Date: 20 Gy
Reference Point Session Dosage Given: 2 Gy
Session Number: 10

## 2022-05-21 LAB — CBC WITH DIFFERENTIAL (CANCER CENTER ONLY)
Abs Immature Granulocytes: 0.03 10*3/uL (ref 0.00–0.07)
Basophils Absolute: 0 10*3/uL (ref 0.0–0.1)
Basophils Relative: 0 %
Eosinophils Absolute: 0 10*3/uL (ref 0.0–0.5)
Eosinophils Relative: 1 %
HCT: 34.9 % — ABNORMAL LOW (ref 36.0–46.0)
Hemoglobin: 12 g/dL (ref 12.0–15.0)
Immature Granulocytes: 1 %
Lymphocytes Relative: 21 %
Lymphs Abs: 1.2 10*3/uL (ref 0.7–4.0)
MCH: 31.6 pg (ref 26.0–34.0)
MCHC: 34.4 g/dL (ref 30.0–36.0)
MCV: 91.8 fL (ref 80.0–100.0)
Monocytes Absolute: 0.4 10*3/uL (ref 0.1–1.0)
Monocytes Relative: 6 %
Neutro Abs: 4.2 10*3/uL (ref 1.7–7.7)
Neutrophils Relative %: 71 %
Platelet Count: 203 10*3/uL (ref 150–400)
RBC: 3.8 MIL/uL — ABNORMAL LOW (ref 3.87–5.11)
RDW: 12.6 % (ref 11.5–15.5)
WBC Count: 5.9 10*3/uL (ref 4.0–10.5)
nRBC: 0 % (ref 0.0–0.2)

## 2022-05-21 MED ORDER — DIPHENHYDRAMINE HCL 50 MG/ML IJ SOLN
50.0000 mg | Freq: Once | INTRAMUSCULAR | Status: AC
  Administered 2022-05-21: 50 mg via INTRAVENOUS
  Filled 2022-05-21: qty 1

## 2022-05-21 MED ORDER — SODIUM CHLORIDE 0.9 % IV SOLN
10.0000 mg | Freq: Once | INTRAVENOUS | Status: AC
  Administered 2022-05-21: 10 mg via INTRAVENOUS
  Filled 2022-05-21: qty 10

## 2022-05-21 MED ORDER — PALONOSETRON HCL INJECTION 0.25 MG/5ML
0.2500 mg | Freq: Once | INTRAVENOUS | Status: AC
  Administered 2022-05-21: 0.25 mg via INTRAVENOUS
  Filled 2022-05-21: qty 5

## 2022-05-21 MED ORDER — SODIUM CHLORIDE 0.9 % IV SOLN
45.0000 mg/m2 | Freq: Once | INTRAVENOUS | Status: AC
  Administered 2022-05-21: 90 mg via INTRAVENOUS
  Filled 2022-05-21: qty 15

## 2022-05-21 MED ORDER — SODIUM CHLORIDE 0.9 % IV SOLN: Freq: Once | INTRAVENOUS | Status: AC

## 2022-05-21 MED ORDER — SODIUM CHLORIDE 0.9 % IV SOLN
145.8000 mg | Freq: Once | INTRAVENOUS | Status: AC
  Administered 2022-05-21: 150 mg via INTRAVENOUS
  Filled 2022-05-21: qty 15

## 2022-05-21 MED ORDER — FAMOTIDINE IN NACL 20-0.9 MG/50ML-% IV SOLN
20.0000 mg | Freq: Once | INTRAVENOUS | Status: AC
  Administered 2022-05-21: 20 mg via INTRAVENOUS
  Filled 2022-05-21: qty 50

## 2022-05-21 NOTE — Progress Notes (Signed)
[place] [phone]   [phone]  OFFICE PROGRESS NOTE  Beverley Fiedler, FNP [place] [id number]  DIAGNOSIS: Recurrent non-small cell lung cancer that was initially diagnosed as stage IB (T2 a, N0, M0) non-small cell lung cancer, adenocarcinoma diagnosed in September 2020. She is status post left upper lobectomy with lymph node dissection with tumor size of 3.2 cm and negative lymphadenopathy at that time. She had disease recurrence in July 2022 with anterior mediastinal lymphadenopathy which was consistent with metastatic non-small cell lung cancer.    Biomarker Findings Tumor Mutational Burden - 21 Muts/Mb Microsatellite status - MS-Stable Genomic Findings For a complete list of the genes assayed, please refer to the Appendix. KRAS G12V TP53 F134L 7 Disease relevant genes with no reportable alterations: ALK, BRAF, EGFR, ERBB2, MET, RET, ROS1   PDL1: 0%   PRIOR THERAPY: 1)  Left upper lobectomy under the care of Dr. Kipp Brood in November 2020. 2) SBRT to the disease recurrence in the anterior mediastinal lymphadenopathy under the care of Dr. Lisbeth Renshaw. Last treatment 12/16/20.   CURRENT THERAPY: Concurrent chemoradiation with carboplatin for an AUC of 2 and Taxol 45 mg/m2. First dose expected on 05/08/2022.  Status post 2 cycles  INTERVAL HISTORY: Charlett Hinkley 77 y.o. female returns to the clinic today for follow-up visit accompanied by her daughter Kendrick Fries.  The patient is feeling fine today with no concerning complaints.  She tolerated the first 2 weeks of her treatment with concurrent chemoradiation fairly well.  She has no nausea, vomiting, diarrhea or constipation.  She has no headache or visual changes.  She denied having any chest pain, shortness of breath, cough or hemoptysis.  She is here today for evaluation before starting cycle #3.  MEDICAL HISTORY: Past Medical History:  Diagnosis Date   Cancer ([place])     Lung cancer   History of chicken pox    Hyperlipidemia    Hypertension    Menopause    Osteoporosis    Pneumonia    "walking" pneumonia   Pre-diabetes    Vitamin D deficiency     ALLERGIES:  is allergic to aspirin and latex.  MEDICATIONS:  Current Outpatient Medications  Medication Sig Dispense Refill   atorvastatin (LIPITOR) 40 MG tablet Take 40 mg by mouth every evening.      diltiazem (DILACOR XR) 240 MG 24 hr capsule Take 240 mg by mouth daily.     guaiFENesin (MUCINEX) 600 MG 12 hr tablet Take 1 tablet (600 mg total) by mouth 2 (two) times daily. (Patient taking differently: Take 600 mg by mouth daily.)     metoprolol succinate (TOPROL-XL) 50 MG 24 hr tablet Take 50 mg by mouth daily.     Multiple Vitamin (MULTIVITAMIN WITH MINERALS) TABS tablet Take 1 tablet by mouth daily.     prochlorperazine (COMPAZINE) 10 MG tablet Take 1 tablet (10 mg total) by mouth every 6 (six) hours as needed. (Patient not taking: Reported on 05/08/2022) 30 tablet 2   triamterene-hydrochlorothiazide (MAXZIDE-25) 37.5-25 MG tablet Take 1 tablet by mouth daily.     No current facility-administered medications for this visit.    SURGICAL HISTORY:  Past Surgical History:  Procedure Laterality Date   CHEST TUBE INSERTION Left 01/19/2019   Procedure: Chest Tube Insertion;  Surgeon: Lajuana Matte, MD;  Location: [facility] OR;  Service: Thoracic;  Laterality: Left;   PLEURADESIS N/A 01/06/2019   Procedure: Pleuradesis - Chemical;  Surgeon: Melodie Bouillon  O, MD;  Location: [facility];  Service: Thoracic;  Laterality: N/A;   PLEURADESIS Left 01/19/2019   Procedure: Mechanical Pleuradesis;  Surgeon: Lajuana Matte, MD;  Location: [place];  Service: Thoracic;  Laterality: Left;   VIDEO ASSISTED THORACOSCOPY Left 01/19/2019   Procedure: VIDEO ASSISTED THORACOSCOPY;  Surgeon: Lajuana Matte, MD;  Location: [place];  Service: Thoracic;  Laterality: Left;   VIDEO ASSISTED THORACOSCOPY (VATS)/ LOBECTOMY  Left 12/29/2018   Procedure: VIDEO ASSISTED THORACOSCOPY (VATS)/LEFT UPPER  LOBECTOMY with Mediastinal lymph node exploration.;  Surgeon: Lajuana Matte, MD;  Location: [facility];  Service: Thoracic;  Laterality: Left;   VIDEO BRONCHOSCOPY N/A 12/29/2018   Procedure: VIDEO BRONCHOSCOPY;  Surgeon: Lajuana Matte, MD;  Location: [facility];  Service: Thoracic;  Laterality: N/A;   [place] N/A 12/09/2018   Procedure: VIDEO BRONCHOSCOPY WITH ENDOBRONCHIAL NAVIGATION;  Surgeon: Lajuana Matte, MD;  Location: [facility];  Service: Thoracic;  Laterality: N/A;   VIDEO BRONCHOSCOPY WITH INSERTION OF INTERBRONCHIAL VALVE (IBV) N/A 01/06/2019   Procedure: VIDEO BRONCHOSCOPY WITH INSERTION OF INTERBRONCHIAL VALVE (IBV);  Surgeon: Lajuana Matte, MD;  Location: [place];  Service: Thoracic;  Laterality: N/A;   VIDEO BRONCHOSCOPY WITH INSERTION OF INTERBRONCHIAL VALVE (IBV) N/A 01/13/2019   Procedure: VIDEO BRONCHOSCOPY WITH INSERTION OF INTERBRONCHIAL VALVE (IBV);  Surgeon: Lajuana Matte, MD;  Location: [facility];  Service: Thoracic;  Laterality: N/A;   VIDEO BRONCHOSCOPY WITH INSERTION OF INTERBRONCHIAL VALVE (IBV) N/A 02/23/2019   Procedure: VIDEO BRONCHOSCOPY WITH REMOVAL OF INTERBRONCHIAL VALVE (IBV);  Surgeon: Lajuana Matte, MD;  Location: [facility];  Service: Thoracic;  Laterality: N/A;    REVIEW OF SYSTEMS:  A comprehensive review of systems was negative.   PHYSICAL EXAMINATION: General appearance: alert, cooperative, and no distress Head: Normocephalic, without obvious abnormality, atraumatic Neck: no adenopathy, no JVD, supple, symmetrical, trachea midline, and thyroid not enlarged, symmetric, no tenderness/mass/nodules Lymph nodes: Cervical, supraclavicular, and axillary nodes normal. Resp: clear to auscultation bilaterally Back: symmetric, no curvature. ROM normal. No CVA tenderness. Cardio: regular rate and rhythm, S1, S2 normal, no murmur,  click, rub or gallop GI: soft, non-tender; bowel sounds normal; no masses,  no organomegaly Extremities: extremities normal, atraumatic, no cyanosis or edema  ECOG PERFORMANCE STATUS: 1 - Symptomatic but completely ambulatory  Blood pressure 119/68, pulse 71, temperature 98 F (36.7 C), temperature source Oral, resp. rate 16, weight 172 lb (78 kg), SpO2 99 %.  LABORATORY DATA: Lab Results  Component Value Date   WBC 5.9 05/21/2022   HGB 12.0 05/21/2022   HCT 34.9 (L) 05/21/2022   MCV 91.8 05/21/2022   PLT 203 05/21/2022      Chemistry      Component Value Date/Time   NA 138 05/21/2022 1051   K 3.6 05/21/2022 1051   CL 101 05/21/2022 1051   CO2 29 05/21/2022 1051   BUN 17 05/21/2022 1051   CREATININE 1.06 (H) 05/21/2022 1051   GLU 845 10/28/2009 0000      Component Value Date/Time   CALCIUM 9.3 05/21/2022 1051   ALKPHOS 93 05/21/2022 1051   AST 20 05/21/2022 1051   ALT 24 05/21/2022 1051   BILITOT 0.5 05/21/2022 1051       RADIOGRAPHIC STUDIES: MR Brain W Wo Contrast  Result Date: 05/03/2022 CLINICAL DATA:  Metastatic disease evaluation. History of lung cancer. EXAM: MRI HEAD WITHOUT AND WITH CONTRAST TECHNIQUE: Multiplanar, multiecho pulse sequences of the brain and surrounding structures were obtained  without and with intravenous contrast. CONTRAST:  7.36mL GADAVIST GADOBUTROL 1 MMOL/ML IV SOLN COMPARISON:  CT head 02/21/2021 FINDINGS: Brain: There is no acute intracranial hemorrhage, extra-axial fluid collection, or acute infarct Parenchymal volume is normal for age. The ventricles are normal in size. Patchy FLAIR signal abnormality throughout the supratentorial white matter and pons likely reflects sequela of moderate chronic small-vessel ischemic change. There is no suspicious parenchymal signal abnormality, mass lesion, or abnormal enhancement. There is no mass effect or midline shift. Vascular: Normal flow voids. Skull and upper cervical spine: Normal marrow signal.  Sinuses/Orbits: The paranasal sinuses are clear. The globes and orbits are unremarkable. Other: None. IMPRESSION: No evidence of intracranial metastatic disease. Electronically Signed   By: Valetta Mole M.D.   On: 05/03/2022 12:52    ASSESSMENT AND PLAN: This is a very pleasant 77 years old white female with recurrent non-small cell lung cancer that was initially diagnosed as stage Ib (T2 a, N0, M0) adenocarcinoma diagnosed in September 2020 status post left upper lobectomy with lymph node dissection with disease recurrence in July 2022 presenting with anterior mediastinal lymphadenopathy.  The patient is status post curative radiotherapy to this area under the care of Dr. Lisbeth Renshaw completed December 16, 2020.   She was found to have another evidence of disease recurrence in the AP window lymph nodes in February 2024.  She has brain MRI that showed no evidence of metastatic disease to the brain. She is currently on concurrent chemoradiation with weekly carboplatin for AUC of 2 and paclitaxel 45 Mg/M2.  First dose May 08, 2022.  Status post 2 cycles. The patient tolerated the first 2 weeks of her treatment fairly well with no concerning adverse effects. I recommended for her to proceed with cycle #3 today as planned. She will come back for follow-up visit in 2 weeks for evaluation before starting cycle #5. The patient was advised to call immediately if she has any other concerning symptoms in the interval. The patient voices understanding of current disease status and treatment options and is in agreement with the current care plan.  All questions were answered. The patient knows to call the clinic with any problems, questions or concerns. We can certainly see the patient much sooner if necessary.  The total time spent in the appointment was 20 minutes.  Disclaimer: This note was dictated with voice recognition software. Similar sounding words can inadvertently be transcribed and may not be corrected  upon review.

## 2022-05-21 NOTE — Progress Notes (Signed)
Patient seen by MD today  Vitals are within treatment parameters.  Labs reviewed: and are within treatment parameters.  Per physician team, patient is ready for treatment and there are NO modifications to the treatment plan.  

## 2022-05-22 ENCOUNTER — Ambulatory Visit
Admission: RE | Admit: 2022-05-22 | Discharge: 2022-05-22 | Disposition: A | Discharge status: Home / Self Care | Payer: Medicare Other | Source: Ambulatory Visit | Attending: Radiation Oncology | Admitting: Radiation Oncology

## 2022-05-22 ENCOUNTER — Other Ambulatory Visit: Payer: Self-pay

## 2022-05-22 DIAGNOSIS — Z510 Encounter for antineoplastic radiation therapy: Secondary | ICD-10-CM | POA: Diagnosis not present

## 2022-05-22 LAB — RAD ONC ARIA SESSION SUMMARY
Course Elapsed Days: 14
Plan Fractions Treated to Date: 11
Plan Prescribed Dose Per Fraction: 2 Gy
Plan Total Fractions Prescribed: 30
Plan Total Prescribed Dose: 60 Gy
Reference Point Dosage Given to Date: 22 Gy
Reference Point Session Dosage Given: 2 Gy
Session Number: 11

## 2022-05-23 ENCOUNTER — Ambulatory Visit
Admission: RE | Admit: 2022-05-23 | Discharge: 2022-05-23 | Disposition: A | Discharge status: Home / Self Care | Payer: Medicare Other | Source: Ambulatory Visit | Attending: Radiation Oncology | Admitting: Radiation Oncology

## 2022-05-23 ENCOUNTER — Other Ambulatory Visit: Payer: Self-pay

## 2022-05-23 DIAGNOSIS — Z510 Encounter for antineoplastic radiation therapy: Secondary | ICD-10-CM | POA: Diagnosis not present

## 2022-05-23 LAB — RAD ONC ARIA SESSION SUMMARY
Course Elapsed Days: 15
Plan Fractions Treated to Date: 12
Plan Prescribed Dose Per Fraction: 2 Gy
Plan Total Fractions Prescribed: 30
Plan Total Prescribed Dose: 60 Gy
Reference Point Dosage Given to Date: 24 Gy
Reference Point Session Dosage Given: 2 Gy
Session Number: 12

## 2022-05-24 ENCOUNTER — Ambulatory Visit
Admission: RE | Admit: 2022-05-24 | Discharge: 2022-05-24 | Disposition: A | Discharge status: Home / Self Care | Payer: Medicare Other | Source: Ambulatory Visit | Attending: Radiation Oncology | Admitting: Radiation Oncology

## 2022-05-24 ENCOUNTER — Other Ambulatory Visit: Payer: Self-pay

## 2022-05-24 DIAGNOSIS — Z510 Encounter for antineoplastic radiation therapy: Secondary | ICD-10-CM | POA: Diagnosis not present

## 2022-05-24 LAB — RAD ONC ARIA SESSION SUMMARY
Course Elapsed Days: 16
Plan Fractions Treated to Date: 13
Plan Prescribed Dose Per Fraction: 2 Gy
Plan Total Fractions Prescribed: 30
Plan Total Prescribed Dose: 60 Gy
Reference Point Dosage Given to Date: 26 Gy
Reference Point Session Dosage Given: 2 Gy
Session Number: 13

## 2022-05-25 ENCOUNTER — Ambulatory Visit
Admission: RE | Admit: 2022-05-25 | Discharge: 2022-05-25 | Disposition: A | Discharge status: Home / Self Care | Payer: Medicare Other | Source: Ambulatory Visit | Attending: Radiation Oncology | Admitting: Radiation Oncology

## 2022-05-25 ENCOUNTER — Other Ambulatory Visit: Payer: Self-pay

## 2022-05-25 ENCOUNTER — Other Ambulatory Visit: Payer: Self-pay | Admitting: Radiation Oncology

## 2022-05-25 DIAGNOSIS — Z510 Encounter for antineoplastic radiation therapy: Secondary | ICD-10-CM | POA: Diagnosis not present

## 2022-05-25 LAB — RAD ONC ARIA SESSION SUMMARY
Course Elapsed Days: 17
Plan Fractions Treated to Date: 14
Plan Prescribed Dose Per Fraction: 2 Gy
Plan Total Fractions Prescribed: 30
Plan Total Prescribed Dose: 60 Gy
Reference Point Dosage Given to Date: 28 Gy
Reference Point Session Dosage Given: 2 Gy
Session Number: 14

## 2022-05-25 MED ORDER — SUCRALFATE 1 G PO TABS: 1.0000 g | ORAL_TABLET | Freq: Four times a day (QID) | ORAL | 2 refills | Status: DC

## 2022-05-25 MED FILL — Dexamethasone Sodium Phosphate Inj 100 MG/10ML: INTRAMUSCULAR | Qty: 1 | Status: AC

## 2022-05-28 ENCOUNTER — Other Ambulatory Visit: Payer: Self-pay

## 2022-05-28 ENCOUNTER — Ambulatory Visit
Admission: RE | Admit: 2022-05-28 | Discharge: 2022-05-28 | Disposition: A | Discharge status: Home / Self Care | Payer: Medicare Other | Source: Ambulatory Visit | Attending: Radiation Oncology | Admitting: Radiation Oncology

## 2022-05-28 ENCOUNTER — Inpatient Hospital Stay: Payer: Medicare Other

## 2022-05-28 ENCOUNTER — Inpatient Hospital Stay: Payer: Medicare Other | Attending: Internal Medicine

## 2022-05-28 VITALS — BP 129/52 | HR 64 | Temp 98.4°F | Resp 16 | Wt 170.5 lb

## 2022-05-28 DIAGNOSIS — Z902 Acquired absence of lung [part of]: Secondary | ICD-10-CM | POA: Insufficient documentation

## 2022-05-28 DIAGNOSIS — Z5111 Encounter for antineoplastic chemotherapy: Secondary | ICD-10-CM | POA: Insufficient documentation

## 2022-05-28 DIAGNOSIS — Z510 Encounter for antineoplastic radiation therapy: Secondary | ICD-10-CM | POA: Insufficient documentation

## 2022-05-28 DIAGNOSIS — I10 Essential (primary) hypertension: Secondary | ICD-10-CM | POA: Insufficient documentation

## 2022-05-28 DIAGNOSIS — C3412 Malignant neoplasm of upper lobe, left bronchus or lung: Secondary | ICD-10-CM | POA: Insufficient documentation

## 2022-05-28 DIAGNOSIS — C771 Secondary and unspecified malignant neoplasm of intrathoracic lymph nodes: Secondary | ICD-10-CM | POA: Diagnosis not present

## 2022-05-28 DIAGNOSIS — Z923 Personal history of irradiation: Secondary | ICD-10-CM | POA: Insufficient documentation

## 2022-05-28 DIAGNOSIS — C3490 Malignant neoplasm of unspecified part of unspecified bronchus or lung: Secondary | ICD-10-CM

## 2022-05-28 LAB — CBC WITH DIFFERENTIAL (CANCER CENTER ONLY)
Abs Immature Granulocytes: 0.05 10*3/uL (ref 0.00–0.07)
Basophils Absolute: 0 10*3/uL (ref 0.0–0.1)
Basophils Relative: 0 %
Eosinophils Absolute: 0 10*3/uL (ref 0.0–0.5)
Eosinophils Relative: 0 %
HCT: 33.6 % — ABNORMAL LOW (ref 36.0–46.0)
Hemoglobin: 12 g/dL (ref 12.0–15.0)
Immature Granulocytes: 1 %
Lymphocytes Relative: 21 %
Lymphs Abs: 1 10*3/uL (ref 0.7–4.0)
MCH: 32.2 pg (ref 26.0–34.0)
MCHC: 35.7 g/dL (ref 30.0–36.0)
MCV: 90.1 fL (ref 80.0–100.0)
Monocytes Absolute: 0.5 10*3/uL (ref 0.1–1.0)
Monocytes Relative: 9 %
Neutro Abs: 3.5 10*3/uL (ref 1.7–7.7)
Neutrophils Relative %: 69 %
Platelet Count: 171 10*3/uL (ref 150–400)
RBC: 3.73 MIL/uL — ABNORMAL LOW (ref 3.87–5.11)
RDW: 12.8 % (ref 11.5–15.5)
WBC Count: 5.1 10*3/uL (ref 4.0–10.5)
nRBC: 0 % (ref 0.0–0.2)

## 2022-05-28 LAB — CMP (CANCER CENTER ONLY)
ALT: 24 U/L (ref 0–44)
AST: 19 U/L (ref 15–41)
Albumin: 4.4 g/dL (ref 3.5–5.0)
Alkaline Phosphatase: 101 U/L (ref 38–126)
Anion gap: 9 (ref 5–15)
BUN: 18 mg/dL (ref 8–23)
CO2: 29 mmol/L (ref 22–32)
Calcium: 9.8 mg/dL (ref 8.9–10.3)
Chloride: 100 mmol/L (ref 98–111)
Creatinine: 1.09 mg/dL — ABNORMAL HIGH (ref 0.44–1.00)
GFR, Estimated: 53 mL/min — ABNORMAL LOW (ref >60)
Glucose, Bld: 100 mg/dL — ABNORMAL HIGH (ref 70–99)
Potassium: 4.2 mmol/L (ref 3.5–5.1)
Sodium: 138 mmol/L (ref 135–145)
Total Bilirubin: 0.8 mg/dL (ref 0.3–1.2)
Total Protein: 7.1 g/dL (ref 6.5–8.1)

## 2022-05-28 LAB — RAD ONC ARIA SESSION SUMMARY
Course Elapsed Days: 20
Plan Fractions Treated to Date: 15
Plan Prescribed Dose Per Fraction: 2 Gy
Plan Total Fractions Prescribed: 30
Plan Total Prescribed Dose: 60 Gy
Reference Point Dosage Given to Date: 30 Gy
Reference Point Session Dosage Given: 2 Gy
Session Number: 15

## 2022-05-28 MED ORDER — SODIUM CHLORIDE 0.9 % IV SOLN
45.0000 mg/m2 | Freq: Once | INTRAVENOUS | Status: AC
  Administered 2022-05-28: 90 mg via INTRAVENOUS
  Filled 2022-05-28: qty 15

## 2022-05-28 MED ORDER — SODIUM CHLORIDE 0.9 % IV SOLN
10.0000 mg | Freq: Once | INTRAVENOUS | Status: AC
  Administered 2022-05-28: 10 mg via INTRAVENOUS
  Filled 2022-05-28: qty 10

## 2022-05-28 MED ORDER — SODIUM CHLORIDE 0.9 % IV SOLN
145.8000 mg | Freq: Once | INTRAVENOUS | Status: AC
  Administered 2022-05-28: 150 mg via INTRAVENOUS
  Filled 2022-05-28: qty 15

## 2022-05-28 MED ORDER — SODIUM CHLORIDE 0.9 % IV SOLN: Freq: Once | INTRAVENOUS | Status: AC

## 2022-05-28 MED ORDER — FAMOTIDINE IN NACL 20-0.9 MG/50ML-% IV SOLN
20.0000 mg | Freq: Once | INTRAVENOUS | Status: AC
  Administered 2022-05-28: 20 mg via INTRAVENOUS
  Filled 2022-05-28: qty 50

## 2022-05-28 MED ORDER — PALONOSETRON HCL INJECTION 0.25 MG/5ML
0.2500 mg | Freq: Once | INTRAVENOUS | Status: AC
  Administered 2022-05-28: 0.25 mg via INTRAVENOUS
  Filled 2022-05-28: qty 5

## 2022-05-28 MED ORDER — DIPHENHYDRAMINE HCL 50 MG/ML IJ SOLN
50.0000 mg | Freq: Once | INTRAMUSCULAR | Status: AC
  Administered 2022-05-28: 50 mg via INTRAVENOUS
  Filled 2022-05-28: qty 1

## 2022-05-29 ENCOUNTER — Other Ambulatory Visit: Payer: Self-pay

## 2022-05-29 ENCOUNTER — Ambulatory Visit
Admission: RE | Admit: 2022-05-29 | Discharge: 2022-05-29 | Disposition: A | Discharge status: Home / Self Care | Payer: Medicare Other | Source: Ambulatory Visit | Attending: Radiation Oncology | Admitting: Radiation Oncology

## 2022-05-29 DIAGNOSIS — Z510 Encounter for antineoplastic radiation therapy: Secondary | ICD-10-CM | POA: Diagnosis not present

## 2022-05-29 LAB — RAD ONC ARIA SESSION SUMMARY
Course Elapsed Days: 21
Plan Fractions Treated to Date: 16
Plan Prescribed Dose Per Fraction: 2 Gy
Plan Total Fractions Prescribed: 30
Plan Total Prescribed Dose: 60 Gy
Reference Point Dosage Given to Date: 32 Gy
Reference Point Session Dosage Given: 2 Gy
Session Number: 16

## 2022-05-30 ENCOUNTER — Ambulatory Visit
Admission: RE | Admit: 2022-05-30 | Discharge: 2022-05-30 | Disposition: A | Discharge status: Home / Self Care | Payer: Medicare Other | Source: Ambulatory Visit | Attending: Radiation Oncology | Admitting: Radiation Oncology

## 2022-05-30 ENCOUNTER — Other Ambulatory Visit: Payer: Self-pay

## 2022-05-30 DIAGNOSIS — Z510 Encounter for antineoplastic radiation therapy: Secondary | ICD-10-CM | POA: Diagnosis not present

## 2022-05-30 LAB — RAD ONC ARIA SESSION SUMMARY
Course Elapsed Days: 22
Plan Fractions Treated to Date: 17
Plan Prescribed Dose Per Fraction: 2 Gy
Plan Total Fractions Prescribed: 30
Plan Total Prescribed Dose: 60 Gy
Reference Point Dosage Given to Date: 34 Gy
Reference Point Session Dosage Given: 2 Gy
Session Number: 17

## 2022-05-31 ENCOUNTER — Other Ambulatory Visit: Payer: Self-pay

## 2022-05-31 ENCOUNTER — Ambulatory Visit
Admission: RE | Admit: 2022-05-31 | Discharge: 2022-05-31 | Disposition: A | Discharge status: Home / Self Care | Payer: Medicare Other | Source: Ambulatory Visit | Attending: Radiation Oncology | Admitting: Radiation Oncology

## 2022-05-31 DIAGNOSIS — Z510 Encounter for antineoplastic radiation therapy: Secondary | ICD-10-CM | POA: Diagnosis not present

## 2022-05-31 LAB — RAD ONC ARIA SESSION SUMMARY
Course Elapsed Days: 23
Plan Fractions Treated to Date: 18
Plan Prescribed Dose Per Fraction: 2 Gy
Plan Total Fractions Prescribed: 30
Plan Total Prescribed Dose: 60 Gy
Reference Point Dosage Given to Date: 36 Gy
Reference Point Session Dosage Given: 2 Gy
Session Number: 18

## 2022-05-31 NOTE — Progress Notes (Signed)
[facility] OFFICE PROGRESS NOTE  Jennifer Pacini, FNP [address] [place] [address]  DIAGNOSIS: Recurrent non-small cell lung cancer that was initially diagnosed as stage IB (T2 a, N0, M0) non-small cell lung cancer, adenocarcinoma diagnosed in September 2020. She is status post left upper lobectomy with lymph node dissection with tumor size of 3.2 cm and negative lymphadenopathy at that time. She had disease recurrence in July 2022 with anterior mediastinal lymphadenopathy which was consistent with metastatic non-small cell lung cancer.    Biomarker Findings Tumor Mutational Burden - 21 Muts/Mb Microsatellite status - MS-Stable Genomic Findings For a complete list of the genes assayed, please refer to the Appendix. KRAS G12V TP53 F134L 7 Disease relevant genes with no reportable alterations: ALK, BRAF, EGFR, ERBB2, MET, RET, ROS1   PDL1: 0%  PRIOR THERAPY: 1)  Left upper lobectomy under the care of Dr. Cliffton Asters in November 2020. 2) SBRT to the disease recurrence in the anterior mediastinal lymphadenopathy under the care of Dr. Mitzi Hansen. Last treatment 12/16/20.  CURRENT THERAPY: Concurrent chemoradiation with carboplatin for an AUC of 2 and Taxol 45 mg/m2. First dose expected on 05/08/2022.  Status post 4 cycles.    INTERVAL HISTORY: Jennifer [name] 77 y.o. female returns to the clinic today for a follow-up visit accompanied by her daughter.  The patient is currently undergoing treatment with concurrent chemoradiation. She is status post 4 cycles of chemotherapy and she is tolerating well without any concerning adverse side effects except she did note that she is losing her hair. She also mentions that she has a mild self limiting nose bleed last night. She does not take any blood thinners.  Her last day of radiation is tentatively scheduled for 06/21/2022. She has mild esophagitis from radiation and received a prescription of carafate on Friday. She has not needed to  use this yet since her symptoms are mild. Since last being seen she denies any fever, chills, night sweats, or unexplained weight loss.  Denies any chest pain, shortness of breath, cough, or hemoptysis.  Denies any nausea, vomiting, or diarrhea.  She has mild constipation after treatment which she is able to manage. Denies any headache or visual changes.  Denies any peripheral neuropathy.  She is here today for evaluation and repeat blood work before undergoing cycle #5.  MEDICAL HISTORY: Past Medical History:  Diagnosis Date   Cancer ([facility])    Lung cancer   History of chicken pox    Hyperlipidemia    Hypertension    Menopause    Osteoporosis    Pneumonia    "walking" pneumonia   Pre-diabetes    Vitamin D deficiency     ALLERGIES:  is allergic to aspirin and latex.  MEDICATIONS:  Current Outpatient Medications  Medication Sig Dispense Refill   sucralfate (CARAFATE) 1 g tablet Take 1 tablet (1 g total) by mouth 4 (four) times daily. Dissolve each tablet in 15 cc water before use. 120 tablet 2   atorvastatin (LIPITOR) 40 MG tablet Take 40 mg by mouth every evening.      diltiazem (DILACOR XR) 240 MG 24 hr capsule Take 240 mg by mouth daily.     guaiFENesin (MUCINEX) 600 MG 12 hr tablet Take 1 tablet (600 mg total) by mouth 2 (two) times daily. (Patient taking differently: Take 600 mg by mouth daily.)     metoprolol succinate (TOPROL-XL) 50 MG 24 hr tablet Take 50 mg by mouth daily.     Multiple Vitamin (MULTIVITAMIN  WITH MINERALS) TABS tablet Take 1 tablet by mouth daily.     prochlorperazine (COMPAZINE) 10 MG tablet Take 1 tablet (10 mg total) by mouth every 6 (six) hours as needed. (Patient not taking: Reported on 05/08/2022) 30 tablet 2   triamterene-hydrochlorothiazide (MAXZIDE-25) 37.5-25 MG tablet Take 1 tablet by mouth daily.     No current facility-administered medications for this visit.    SURGICAL HISTORY:  Past Surgical History:  Procedure Laterality Date   CHEST TUBE  INSERTION Left 01/19/2019   Procedure: Chest Tube Insertion;  Surgeon: Corliss Skains, MD;  Location: [facility];  Service: Thoracic;  Laterality: Left;   PLEURADESIS N/A 01/06/2019   Procedure: Pleuradesis - Chemical;  Surgeon: Corliss Skains, MD;  Location: [facility];  Service: Thoracic;  Laterality: N/A;   PLEURADESIS Left 01/19/2019   Procedure: Mechanical Pleuradesis;  Surgeon: Corliss Skains, MD;  Location: [facility] OR;  Service: Thoracic;  Laterality: Left;   VIDEO ASSISTED THORACOSCOPY Left 01/19/2019   Procedure: VIDEO ASSISTED THORACOSCOPY;  Surgeon: Corliss Skains, MD;  Location: [facility];  Service: Thoracic;  Laterality: Left;   VIDEO ASSISTED THORACOSCOPY (VATS)/ LOBECTOMY Left 12/29/2018   Procedure: VIDEO ASSISTED THORACOSCOPY (VATS)/LEFT UPPER  LOBECTOMY with Mediastinal lymph node exploration.;  Surgeon: Corliss Skains, MD;  Location: [facility];  Service: Thoracic;  Laterality: Left;   VIDEO BRONCHOSCOPY N/A 12/29/2018   Procedure: VIDEO BRONCHOSCOPY;  Surgeon: Corliss Skains, MD;  Location: [facility];  Service: Thoracic;  Laterality: N/A;   VIDEO BRONCHOSCOPY WITH ENDOBRONCHIAL NAVIGATION N/A 12/09/2018   Procedure: VIDEO BRONCHOSCOPY WITH ENDOBRONCHIAL NAVIGATION;  Surgeon: Corliss Skains, MD;  Location: [facility];  Service: Thoracic;  Laterality: N/A;   VIDEO BRONCHOSCOPY WITH INSERTION OF INTERBRONCHIAL VALVE (IBV) N/A 01/06/2019   Procedure: VIDEO BRONCHOSCOPY WITH INSERTION OF INTERBRONCHIAL VALVE (IBV);  Surgeon: Corliss Skains, MD;  Location: [facility];  Service: Thoracic;  Laterality: N/A;   VIDEO BRONCHOSCOPY WITH INSERTION OF INTERBRONCHIAL VALVE (IBV) N/A 01/13/2019   Procedure: VIDEO BRONCHOSCOPY WITH INSERTION OF INTERBRONCHIAL VALVE (IBV);  Surgeon: Corliss Skains, MD;  Location: [facility] OR;  Service: Thoracic;  Laterality: N/A;   VIDEO BRONCHOSCOPY WITH INSERTION OF INTERBRONCHIAL VALVE (IBV) N/A 02/23/2019   Procedure: VIDEO BRONCHOSCOPY WITH REMOVAL  OF INTERBRONCHIAL VALVE (IBV);  Surgeon: Corliss Skains, MD;  Location: [facility] OR;  Service: Thoracic;  Laterality: N/A;    REVIEW OF SYSTEMS:   Review of Systems  Constitutional: Negative for appetite change, chills, fatigue, fever and unexpected weight change.  HENT:  Positive for mild odynophagia. Positive for mild self limiting nose bleed last night. Negative for mouth sores, sore throat and trouble swallowing.   Eyes: Negative for eye problems and icterus.  Respiratory: Negative for cough, hemoptysis, shortness of breath and wheezing.   Cardiovascular: Negative for chest pain and leg swelling.  Gastrointestinal: Positive for mild constipation. Negative for abdominal pain,  diarrhea, nausea and vomiting.  Genitourinary: Negative for bladder incontinence, difficulty urinating, dysuria, frequency and hematuria.   Musculoskeletal: Negative for back pain, gait problem, neck pain and neck stiffness.  Skin: Negative for itching and rash.  Neurological: Negative for dizziness, extremity weakness, gait problem, headaches, light-headedness and seizures.  Hematological: Negative for adenopathy. Does not bruise/bleed easily.  Psychiatric/Behavioral: Negative for confusion, depression and sleep disturbance. The patient is not nervous/anxious.     PHYSICAL EXAMINATION:  Blood pressure 132/78, pulse 96, temperature 97.8 F (36.6 C), temperature source Temporal, resp. rate 17, height 5\' 9"  (1.753 m),  weight 169 lb (76.7 kg), SpO2 98 %.  ECOG PERFORMANCE STATUS: 1  Physical Exam  Constitutional: Oriented to person, place, and time and well-developed, well-nourished, and in no distress.  HENT:  Head: Normocephalic and atraumatic.  Mouth/Throat: Oropharynx is clear and moist. No oropharyngeal exudate.  Eyes: Conjunctivae are normal. Right eye exhibits no discharge. Left eye exhibits no discharge. No scleral icterus.  Neck: Normal range of motion. Neck supple.  Cardiovascular: Normal rate, regular  rhythm, normal heart sounds and intact distal pulses.   Pulmonary/Chest: Effort normal and breath sounds normal. No respiratory distress. No wheezes. No rales.  Abdominal: Soft. Bowel sounds are normal. Exhibits no distension and no mass. There is no tenderness.  Musculoskeletal: Normal range of motion. Exhibits no edema.  Lymphadenopathy:    No cervical adenopathy.  Neurological: Alert and oriented to person, place, and time. Exhibits normal muscle tone. Gait normal. Coordination normal.  Skin: positive for bruising on her hands bilaterally. Skin is warm and dry. No rash noted. Not diaphoretic. No erythema. No pallor.  Psychiatric: Mood, memory and judgment normal.  Vitals reviewed.  LABORATORY DATA: Lab Results  Component Value Date   WBC 4.6 06/04/2022   HGB 12.0 06/04/2022   HCT 34.5 (L) 06/04/2022   MCV 91.3 06/04/2022   PLT 138 (L) 06/04/2022      Chemistry      Component Value Date/Time   NA 139 06/04/2022 1002   K 3.4 (L) 06/04/2022 1002   CL 100 06/04/2022 1002   CO2 29 06/04/2022 1002   BUN 20 06/04/2022 1002   CREATININE 1.16 (H) 06/04/2022 1002   GLU 845 10/28/2009 0000      Component Value Date/Time   CALCIUM 9.7 06/04/2022 1002   ALKPHOS 94 06/04/2022 1002   AST 19 06/04/2022 1002   ALT 24 06/04/2022 1002   BILITOT 0.6 06/04/2022 1002       RADIOGRAPHIC STUDIES:  No results found.   ASSESSMENT/PLAN:  This is a very pleasant 77 year old Caucasian female with recurrent non-small cell lung cancer that was initially diagnosed with stage Ib (T2 a, N0, M0) non-small cell lung cancer, adenocarcinoma.  She was initially diagnosed in September of 2020.  She was initially diagnosed with a left upper lobe lesion status post left upper lobectomy lymph node dissection with a tumor size of 3.2 and negative lymphadenopathy at that time.  The patient and September 2022 was found to have disease recurrence with biopsy-proven metastatic non-small cell lung cancer in the  mediastinal lymph node.  PD-L1 expression is 0%.  She is negative for any actionable mutations.     She completed SBRT to the disease recurrence on 12/16/20 under the care of Dr. Mitzi HansenMoody.    She had a restaging CT scan that showed enlarging AP window lymph node in January 2024. The PET CT in February 2024 confirmed the AP window lymph node is significantly hypermetabolic.  Therefore, this is compatible with disease recurrence.   Therefore, the patient is currently undergoing a course of concurrent chemoradiation with carboplatin for an AUC of 2 and paclitaxel 45 mg/m.  She is status post 4 cycles and is tolerating it well.  Her last day radiation is scheduled for 06/21/2022.    Labs were reviewed.  Recommend that she proceed with cycle #5 today's schedule.  The patient reports that her hair has been falling out in clumps.  I explained to her that she should not be losing all of her hair with this treatment but her  hair may get thinner.  Her platelet count is 138 today.  I do not think this is contributing to her nosebleed which may be secondary to the dry air.  She was advised to avoid forceful blowing and to use saline spray to keep her nose moist. We will see her back for follow-up visit in 2 weeks for evaluation and repeat blood work before undergoing cycle #7.  We will discuss ordering a restaging CT scan at her next appointment.  The patient was advised to call immediately if she has any concerning symptoms in the interval. The patient voices understanding of current disease status and treatment options and is in agreement with the current care plan. All questions were answered. The patient knows to call the clinic with any problems, questions or concerns. We can certainly see the patient much sooner if necessary         No orders of the defined types were placed in this encounter.    The total time spent in the appointment was 20-29 minutes.   Korrie Hofbauer L Fran Neiswonger,  PA-C 06/04/22

## 2022-06-01 ENCOUNTER — Ambulatory Visit
Admission: RE | Admit: 2022-06-01 | Discharge: 2022-06-01 | Disposition: A | Discharge status: Home / Self Care | Payer: Medicare Other | Source: Ambulatory Visit | Attending: Radiation Oncology | Admitting: Radiation Oncology

## 2022-06-01 ENCOUNTER — Other Ambulatory Visit: Payer: Self-pay

## 2022-06-01 DIAGNOSIS — Z510 Encounter for antineoplastic radiation therapy: Secondary | ICD-10-CM | POA: Diagnosis not present

## 2022-06-01 LAB — RAD ONC ARIA SESSION SUMMARY
Course Elapsed Days: 24
Plan Fractions Treated to Date: 19
Plan Prescribed Dose Per Fraction: 2 Gy
Plan Total Fractions Prescribed: 30
Plan Total Prescribed Dose: 60 Gy
Reference Point Dosage Given to Date: 38 Gy
Reference Point Session Dosage Given: 2 Gy
Session Number: 19

## 2022-06-01 MED FILL — Dexamethasone Sodium Phosphate Inj 100 MG/10ML: INTRAMUSCULAR | Qty: 1 | Status: AC

## 2022-06-04 ENCOUNTER — Ambulatory Visit
Admission: RE | Admit: 2022-06-04 | Discharge: 2022-06-04 | Disposition: A | Discharge status: Home / Self Care | Payer: Medicare Other | Source: Ambulatory Visit | Attending: Radiation Oncology | Admitting: Radiation Oncology

## 2022-06-04 ENCOUNTER — Other Ambulatory Visit: Payer: Self-pay

## 2022-06-04 ENCOUNTER — Inpatient Hospital Stay (HOSPITAL_BASED_OUTPATIENT_CLINIC_OR_DEPARTMENT_OTHER): Payer: Medicare Other | Admitting: Physician Assistant

## 2022-06-04 ENCOUNTER — Inpatient Hospital Stay: Payer: Medicare Other

## 2022-06-04 VITALS — BP 132/78 | HR 96 | Temp 97.8°F | Resp 17 | Ht 69.0 in | Wt 169.0 lb

## 2022-06-04 DIAGNOSIS — Z5111 Encounter for antineoplastic chemotherapy: Secondary | ICD-10-CM | POA: Diagnosis not present

## 2022-06-04 DIAGNOSIS — Z510 Encounter for antineoplastic radiation therapy: Secondary | ICD-10-CM | POA: Diagnosis not present

## 2022-06-04 DIAGNOSIS — C3490 Malignant neoplasm of unspecified part of unspecified bronchus or lung: Secondary | ICD-10-CM | POA: Diagnosis not present

## 2022-06-04 LAB — CMP (CANCER CENTER ONLY)
ALT: 24 U/L (ref 0–44)
AST: 19 U/L (ref 15–41)
Albumin: 4.4 g/dL (ref 3.5–5.0)
Alkaline Phosphatase: 94 U/L (ref 38–126)
Anion gap: 10 (ref 5–15)
BUN: 20 mg/dL (ref 8–23)
CO2: 29 mmol/L (ref 22–32)
Calcium: 9.7 mg/dL (ref 8.9–10.3)
Chloride: 100 mmol/L (ref 98–111)
Creatinine: 1.16 mg/dL — ABNORMAL HIGH (ref 0.44–1.00)
GFR, Estimated: 49 mL/min — ABNORMAL LOW (ref >60)
Glucose, Bld: 116 mg/dL — ABNORMAL HIGH (ref 70–99)
Potassium: 3.4 mmol/L — ABNORMAL LOW (ref 3.5–5.1)
Sodium: 139 mmol/L (ref 135–145)
Total Bilirubin: 0.6 mg/dL (ref 0.3–1.2)
Total Protein: 6.9 g/dL (ref 6.5–8.1)

## 2022-06-04 LAB — RAD ONC ARIA SESSION SUMMARY
Course Elapsed Days: 27
Plan Fractions Treated to Date: 20
Plan Prescribed Dose Per Fraction: 2 Gy
Plan Total Fractions Prescribed: 30
Plan Total Prescribed Dose: 60 Gy
Reference Point Dosage Given to Date: 40 Gy
Reference Point Session Dosage Given: 2 Gy
Session Number: 20

## 2022-06-04 LAB — CBC WITH DIFFERENTIAL (CANCER CENTER ONLY)
Abs Immature Granulocytes: 0.03 10*3/uL (ref 0.00–0.07)
Basophils Absolute: 0 10*3/uL (ref 0.0–0.1)
Basophils Relative: 0 %
Eosinophils Absolute: 0 10*3/uL (ref 0.0–0.5)
Eosinophils Relative: 0 %
HCT: 34.5 % — ABNORMAL LOW (ref 36.0–46.0)
Hemoglobin: 12 g/dL (ref 12.0–15.0)
Immature Granulocytes: 1 %
Lymphocytes Relative: 15 %
Lymphs Abs: 0.7 10*3/uL (ref 0.7–4.0)
MCH: 31.7 pg (ref 26.0–34.0)
MCHC: 34.8 g/dL (ref 30.0–36.0)
MCV: 91.3 fL (ref 80.0–100.0)
Monocytes Absolute: 0.3 10*3/uL (ref 0.1–1.0)
Monocytes Relative: 7 %
Neutro Abs: 3.5 10*3/uL (ref 1.7–7.7)
Neutrophils Relative %: 77 %
Platelet Count: 138 10*3/uL — ABNORMAL LOW (ref 150–400)
RBC: 3.78 MIL/uL — ABNORMAL LOW (ref 3.87–5.11)
RDW: 13 % (ref 11.5–15.5)
WBC Count: 4.6 10*3/uL (ref 4.0–10.5)
nRBC: 0 % (ref 0.0–0.2)

## 2022-06-04 MED ORDER — SODIUM CHLORIDE 0.9 % IV SOLN
45.0000 mg/m2 | Freq: Once | INTRAVENOUS | Status: AC
  Administered 2022-06-04: 90 mg via INTRAVENOUS
  Filled 2022-06-04: qty 15

## 2022-06-04 MED ORDER — SODIUM CHLORIDE 0.9 % IV SOLN
145.8000 mg | Freq: Once | INTRAVENOUS | Status: AC
  Administered 2022-06-04: 150 mg via INTRAVENOUS
  Filled 2022-06-04: qty 15

## 2022-06-04 MED ORDER — DIPHENHYDRAMINE HCL 50 MG/ML IJ SOLN
50.0000 mg | Freq: Once | INTRAMUSCULAR | Status: AC
  Administered 2022-06-04: 50 mg via INTRAVENOUS
  Filled 2022-06-04: qty 1

## 2022-06-04 MED ORDER — SODIUM CHLORIDE 0.9 % IV SOLN: Freq: Once | INTRAVENOUS | Status: AC

## 2022-06-04 MED ORDER — FAMOTIDINE IN NACL 20-0.9 MG/50ML-% IV SOLN
20.0000 mg | Freq: Once | INTRAVENOUS | Status: AC
  Administered 2022-06-04: 20 mg via INTRAVENOUS
  Filled 2022-06-04: qty 50

## 2022-06-04 MED ORDER — PALONOSETRON HCL INJECTION 0.25 MG/5ML
0.2500 mg | Freq: Once | INTRAVENOUS | Status: AC
  Administered 2022-06-04: 0.25 mg via INTRAVENOUS
  Filled 2022-06-04: qty 5

## 2022-06-04 MED ORDER — SODIUM CHLORIDE 0.9 % IV SOLN
10.0000 mg | Freq: Once | INTRAVENOUS | Status: AC
  Administered 2022-06-04: 10 mg via INTRAVENOUS
  Filled 2022-06-04: qty 10

## 2022-06-04 NOTE — Patient Instructions (Signed)
[facility]  Discharge Instructions: Thank you for choosing [facility] to provide your oncology and hematology care.   If you have a lab appointment with the Cancer Center, please go directly to the Cancer Center and check in at the registration area.   Wear comfortable clothing and clothing appropriate for easy access to any Portacath or PICC line.   We strive to give you quality time with your provider. You may need to reschedule your appointment if you arrive late (15 or more minutes).  Arriving late affects you and other patients whose appointments are after yours.  Also, if you miss three or more appointments without notifying the office, you may be dismissed from the clinic at the provider's discretion.      For prescription refill requests, have your pharmacy contact our office and allow 72 hours for refills to be completed.    Today you received the following chemotherapy and/or immunotherapy agents paclitaxel, carboplatin      To help prevent nausea and vomiting after your treatment, we encourage you to take your nausea medication as directed.  BELOW ARE SYMPTOMS THAT SHOULD BE REPORTED IMMEDIATELY: *FEVER GREATER THAN 100.4 F (38 C) OR HIGHER *CHILLS OR SWEATING *NAUSEA AND VOMITING THAT IS NOT CONTROLLED WITH YOUR NAUSEA MEDICATION *UNUSUAL SHORTNESS OF BREATH *UNUSUAL BRUISING OR BLEEDING *URINARY PROBLEMS (pain or burning when urinating, or frequent urination) *BOWEL PROBLEMS (unusual diarrhea, constipation, pain near the anus) TENDERNESS IN MOUTH AND THROAT WITH OR WITHOUT PRESENCE OF ULCERS (sore throat, sores in mouth, or a toothache) UNUSUAL RASH, SWELLING OR PAIN  UNUSUAL VAGINAL DISCHARGE OR ITCHING   Items with * indicate a potential emergency and should be followed up as soon as possible or go to the Emergency Department if any problems should occur.  Please show the CHEMOTHERAPY ALERT CARD or IMMUNOTHERAPY ALERT  CARD at check-in to the Emergency Department and triage nurse.  Should you have questions after your visit or need to cancel or reschedule your appointment, please contact [facility]  Dept: [phone]  and follow the prompts.  Office hours are 8:00 a.m. to 4:30 p.m. Monday - Friday. Please note that voicemails left after 4:00 p.m. may not be returned until the following business day.  We are closed weekends and major holidays. You have access to a nurse at all times for urgent questions. Please call the main number to the clinic Dept: [phone] and follow the prompts.   For any non-urgent questions, you may also contact your provider using MyChart. We now offer e-Visits for anyone 18 and older to request care online for non-urgent symptoms. For details visit [email or web address].   Also download the MyChart app! Go to the app store, search "MyChart", open the app, select [facility], and log in with your MyChart username and password. 

## 2022-06-05 ENCOUNTER — Other Ambulatory Visit: Payer: Self-pay

## 2022-06-05 ENCOUNTER — Ambulatory Visit
Admission: RE | Admit: 2022-06-05 | Discharge: 2022-06-05 | Disposition: A | Discharge status: Home / Self Care | Payer: Medicare Other | Source: Ambulatory Visit | Attending: Radiation Oncology | Admitting: Radiation Oncology

## 2022-06-05 DIAGNOSIS — Z510 Encounter for antineoplastic radiation therapy: Secondary | ICD-10-CM | POA: Diagnosis not present

## 2022-06-05 LAB — RAD ONC ARIA SESSION SUMMARY
Course Elapsed Days: 28
Plan Fractions Treated to Date: 21
Plan Prescribed Dose Per Fraction: 2 Gy
Plan Total Fractions Prescribed: 30
Plan Total Prescribed Dose: 60 Gy
Reference Point Dosage Given to Date: 42 Gy
Reference Point Session Dosage Given: 2 Gy
Session Number: 21

## 2022-06-06 ENCOUNTER — Other Ambulatory Visit: Payer: Self-pay

## 2022-06-06 ENCOUNTER — Ambulatory Visit
Admission: RE | Admit: 2022-06-06 | Discharge: 2022-06-06 | Disposition: A | Discharge status: Home / Self Care | Payer: Medicare Other | Source: Ambulatory Visit | Attending: Radiation Oncology | Admitting: Radiation Oncology

## 2022-06-06 DIAGNOSIS — Z510 Encounter for antineoplastic radiation therapy: Secondary | ICD-10-CM | POA: Diagnosis not present

## 2022-06-06 LAB — RAD ONC ARIA SESSION SUMMARY
Course Elapsed Days: 29
Plan Fractions Treated to Date: 22
Plan Prescribed Dose Per Fraction: 2 Gy
Plan Total Fractions Prescribed: 30
Plan Total Prescribed Dose: 60 Gy
Reference Point Dosage Given to Date: 44 Gy
Reference Point Session Dosage Given: 2 Gy
Session Number: 22

## 2022-06-07 ENCOUNTER — Other Ambulatory Visit: Payer: Self-pay

## 2022-06-07 ENCOUNTER — Ambulatory Visit
Admission: RE | Admit: 2022-06-07 | Discharge: 2022-06-07 | Disposition: A | Discharge status: Home / Self Care | Payer: Medicare Other | Source: Ambulatory Visit | Attending: Radiation Oncology | Admitting: Radiation Oncology

## 2022-06-07 DIAGNOSIS — Z510 Encounter for antineoplastic radiation therapy: Secondary | ICD-10-CM | POA: Diagnosis not present

## 2022-06-07 LAB — RAD ONC ARIA SESSION SUMMARY
Course Elapsed Days: 30
Plan Fractions Treated to Date: 23
Plan Prescribed Dose Per Fraction: 2 Gy
Plan Total Fractions Prescribed: 30
Plan Total Prescribed Dose: 60 Gy
Reference Point Dosage Given to Date: 46 Gy
Reference Point Session Dosage Given: 2 Gy
Session Number: 23

## 2022-06-08 ENCOUNTER — Ambulatory Visit
Admission: RE | Admit: 2022-06-08 | Discharge: 2022-06-08 | Disposition: A | Discharge status: Home / Self Care | Payer: Medicare Other | Source: Ambulatory Visit | Attending: Radiation Oncology | Admitting: Radiation Oncology

## 2022-06-08 ENCOUNTER — Other Ambulatory Visit: Payer: Self-pay

## 2022-06-08 DIAGNOSIS — Z510 Encounter for antineoplastic radiation therapy: Secondary | ICD-10-CM | POA: Diagnosis not present

## 2022-06-08 LAB — RAD ONC ARIA SESSION SUMMARY
Course Elapsed Days: 31
Plan Fractions Treated to Date: 24
Plan Prescribed Dose Per Fraction: 2 Gy
Plan Total Fractions Prescribed: 30
Plan Total Prescribed Dose: 60 Gy
Reference Point Dosage Given to Date: 48 Gy
Reference Point Session Dosage Given: 2 Gy
Session Number: 24

## 2022-06-08 MED FILL — Dexamethasone Sodium Phosphate Inj 100 MG/10ML: INTRAMUSCULAR | Qty: 1 | Status: AC

## 2022-06-11 ENCOUNTER — Other Ambulatory Visit: Payer: Self-pay

## 2022-06-11 ENCOUNTER — Inpatient Hospital Stay: Payer: Medicare Other

## 2022-06-11 ENCOUNTER — Ambulatory Visit
Admission: RE | Admit: 2022-06-11 | Discharge: 2022-06-11 | Disposition: A | Discharge status: Home / Self Care | Payer: Medicare Other | Source: Ambulatory Visit | Attending: Radiation Oncology | Admitting: Radiation Oncology

## 2022-06-11 VITALS — BP 137/70 | HR 79 | Temp 98.2°F | Resp 18 | Wt 169.0 lb

## 2022-06-11 DIAGNOSIS — C3490 Malignant neoplasm of unspecified part of unspecified bronchus or lung: Secondary | ICD-10-CM

## 2022-06-11 DIAGNOSIS — Z510 Encounter for antineoplastic radiation therapy: Secondary | ICD-10-CM | POA: Diagnosis not present

## 2022-06-11 LAB — RAD ONC ARIA SESSION SUMMARY
Course Elapsed Days: 34
Plan Fractions Treated to Date: 25
Plan Prescribed Dose Per Fraction: 2 Gy
Plan Total Fractions Prescribed: 30
Plan Total Prescribed Dose: 60 Gy
Reference Point Dosage Given to Date: 50 Gy
Reference Point Session Dosage Given: 2 Gy
Session Number: 25

## 2022-06-11 LAB — CMP (CANCER CENTER ONLY)
ALT: 22 U/L (ref 0–44)
AST: 18 U/L (ref 15–41)
Albumin: 4.3 g/dL (ref 3.5–5.0)
Alkaline Phosphatase: 96 U/L (ref 38–126)
Anion gap: 10 (ref 5–15)
BUN: 19 mg/dL (ref 8–23)
CO2: 28 mmol/L (ref 22–32)
Calcium: 9.5 mg/dL (ref 8.9–10.3)
Chloride: 99 mmol/L (ref 98–111)
Creatinine: 1.1 mg/dL — ABNORMAL HIGH (ref 0.44–1.00)
GFR, Estimated: 52 mL/min — ABNORMAL LOW (ref >60)
Glucose, Bld: 99 mg/dL (ref 70–99)
Potassium: 3.2 mmol/L — ABNORMAL LOW (ref 3.5–5.1)
Sodium: 137 mmol/L (ref 135–145)
Total Bilirubin: 0.8 mg/dL (ref 0.3–1.2)
Total Protein: 6.9 g/dL (ref 6.5–8.1)

## 2022-06-11 LAB — CBC WITH DIFFERENTIAL (CANCER CENTER ONLY)
Abs Immature Granulocytes: 0.03 10*3/uL (ref 0.00–0.07)
Basophils Absolute: 0 10*3/uL (ref 0.0–0.1)
Basophils Relative: 0 %
Eosinophils Absolute: 0 10*3/uL (ref 0.0–0.5)
Eosinophils Relative: 0 %
HCT: 31.5 % — ABNORMAL LOW (ref 36.0–46.0)
Hemoglobin: 11.1 g/dL — ABNORMAL LOW (ref 12.0–15.0)
Immature Granulocytes: 1 %
Lymphocytes Relative: 17 %
Lymphs Abs: 0.6 10*3/uL — ABNORMAL LOW (ref 0.7–4.0)
MCH: 31.9 pg (ref 26.0–34.0)
MCHC: 35.2 g/dL (ref 30.0–36.0)
MCV: 90.5 fL (ref 80.0–100.0)
Monocytes Absolute: 0.3 10*3/uL (ref 0.1–1.0)
Monocytes Relative: 9 %
Neutro Abs: 2.5 10*3/uL (ref 1.7–7.7)
Neutrophils Relative %: 73 %
Platelet Count: 100 10*3/uL — ABNORMAL LOW (ref 150–400)
RBC: 3.48 MIL/uL — ABNORMAL LOW (ref 3.87–5.11)
RDW: 13.2 % (ref 11.5–15.5)
WBC Count: 3.4 10*3/uL — ABNORMAL LOW (ref 4.0–10.5)
nRBC: 0 % (ref 0.0–0.2)

## 2022-06-11 MED ORDER — SODIUM CHLORIDE 0.9 % IV SOLN
145.8000 mg | Freq: Once | INTRAVENOUS | Status: AC
  Administered 2022-06-11: 150 mg via INTRAVENOUS
  Filled 2022-06-11: qty 15

## 2022-06-11 MED ORDER — DIPHENHYDRAMINE HCL 50 MG/ML IJ SOLN
50.0000 mg | Freq: Once | INTRAMUSCULAR | Status: AC
  Administered 2022-06-11: 50 mg via INTRAVENOUS
  Filled 2022-06-11: qty 1

## 2022-06-11 MED ORDER — PALONOSETRON HCL INJECTION 0.25 MG/5ML
0.2500 mg | Freq: Once | INTRAVENOUS | Status: AC
  Administered 2022-06-11: 0.25 mg via INTRAVENOUS
  Filled 2022-06-11: qty 5

## 2022-06-11 MED ORDER — SODIUM CHLORIDE 0.9 % IV SOLN
10.0000 mg | Freq: Once | INTRAVENOUS | Status: AC
  Administered 2022-06-11: 10 mg via INTRAVENOUS
  Filled 2022-06-11: qty 10

## 2022-06-11 MED ORDER — FAMOTIDINE IN NACL 20-0.9 MG/50ML-% IV SOLN
20.0000 mg | Freq: Once | INTRAVENOUS | Status: AC
  Administered 2022-06-11: 20 mg via INTRAVENOUS
  Filled 2022-06-11: qty 50

## 2022-06-11 MED ORDER — SODIUM CHLORIDE 0.9 % IV SOLN
45.0000 mg/m2 | Freq: Once | INTRAVENOUS | Status: AC
  Administered 2022-06-11: 90 mg via INTRAVENOUS
  Filled 2022-06-11: qty 15

## 2022-06-11 MED ORDER — SODIUM CHLORIDE 0.9 % IV SOLN: Freq: Once | INTRAVENOUS | Status: AC

## 2022-06-11 NOTE — Patient Instructions (Signed)
[facility]  Discharge Instructions: Thank you for choosing [facility] to provide your oncology and hematology care.   If you have a lab appointment with the Cancer Center, please go directly to the Cancer Center and check in at the registration area.   Wear comfortable clothing and clothing appropriate for easy access to any Portacath or PICC line.   We strive to give you quality time with your provider. You may need to reschedule your appointment if you arrive late (15 or more minutes).  Arriving late affects you and other patients whose appointments are after yours.  Also, if you miss three or more appointments without notifying the office, you may be dismissed from the clinic at the provider's discretion.      For prescription refill requests, have your pharmacy contact our office and allow 72 hours for refills to be completed.    Today you received the following chemotherapy and/or immunotherapy agents: Paclitaxel, Carboplatin.       To help prevent nausea and vomiting after your treatment, we encourage you to take your nausea medication as directed.  BELOW ARE SYMPTOMS THAT SHOULD BE REPORTED IMMEDIATELY: *FEVER GREATER THAN 100.4 F (38 C) OR HIGHER *CHILLS OR SWEATING *NAUSEA AND VOMITING THAT IS NOT CONTROLLED WITH YOUR NAUSEA MEDICATION *UNUSUAL SHORTNESS OF BREATH *UNUSUAL BRUISING OR BLEEDING *URINARY PROBLEMS (pain or burning when urinating, or frequent urination) *BOWEL PROBLEMS (unusual diarrhea, constipation, pain near the anus) TENDERNESS IN MOUTH AND THROAT WITH OR WITHOUT PRESENCE OF ULCERS (sore throat, sores in mouth, or a toothache) UNUSUAL RASH, SWELLING OR PAIN  UNUSUAL VAGINAL DISCHARGE OR ITCHING   Items with * indicate a potential emergency and should be followed up as soon as possible or go to the Emergency Department if any problems should occur.  Please show the CHEMOTHERAPY ALERT CARD or IMMUNOTHERAPY  ALERT CARD at check-in to the Emergency Department and triage nurse.  Should you have questions after your visit or need to cancel or reschedule your appointment, please contact [facility]  Dept: [phone]  and follow the prompts.  Office hours are 8:00 a.m. to 4:30 p.m. Monday - Friday. Please note that voicemails left after 4:00 p.m. may not be returned until the following business day.  We are closed weekends and major holidays. You have access to a nurse at all times for urgent questions. Please call the main number to the clinic Dept: [phone] and follow the prompts.   For any non-urgent questions, you may also contact your provider using MyChart. We now offer e-Visits for anyone 18 and older to request care online for non-urgent symptoms. For details visit [email or web address].   Also download the MyChart app! Go to the app store, search "MyChart", open the app, select [facility], and log in with your MyChart username and password.   

## 2022-06-12 ENCOUNTER — Other Ambulatory Visit: Payer: Self-pay

## 2022-06-12 ENCOUNTER — Ambulatory Visit
Admission: RE | Admit: 2022-06-12 | Discharge: 2022-06-12 | Disposition: A | Discharge status: Home / Self Care | Payer: Medicare Other | Source: Ambulatory Visit | Attending: Radiation Oncology | Admitting: Radiation Oncology

## 2022-06-12 DIAGNOSIS — Z510 Encounter for antineoplastic radiation therapy: Secondary | ICD-10-CM | POA: Diagnosis not present

## 2022-06-12 LAB — RAD ONC ARIA SESSION SUMMARY
Course Elapsed Days: 35
Plan Fractions Treated to Date: 26
Plan Prescribed Dose Per Fraction: 2 Gy
Plan Total Fractions Prescribed: 30
Plan Total Prescribed Dose: 60 Gy
Reference Point Dosage Given to Date: 52 Gy
Reference Point Session Dosage Given: 2 Gy
Session Number: 26

## 2022-06-13 ENCOUNTER — Ambulatory Visit
Admission: RE | Admit: 2022-06-13 | Discharge: 2022-06-13 | Disposition: A | Discharge status: Home / Self Care | Payer: Medicare Other | Source: Ambulatory Visit | Attending: Radiation Oncology | Admitting: Radiation Oncology

## 2022-06-13 ENCOUNTER — Other Ambulatory Visit: Payer: Self-pay

## 2022-06-13 DIAGNOSIS — Z510 Encounter for antineoplastic radiation therapy: Secondary | ICD-10-CM | POA: Diagnosis not present

## 2022-06-13 LAB — RAD ONC ARIA SESSION SUMMARY
Course Elapsed Days: 36
Plan Fractions Treated to Date: 27
Plan Prescribed Dose Per Fraction: 2 Gy
Plan Total Fractions Prescribed: 30
Plan Total Prescribed Dose: 60 Gy
Reference Point Dosage Given to Date: 54 Gy
Reference Point Session Dosage Given: 2 Gy
Session Number: 27

## 2022-06-13 NOTE — Progress Notes (Signed)
[facility] OFFICE PROGRESS NOTE  Felix Pacini, FNP [address] [place] [address]  DIAGNOSIS:  Recurrent non-small cell lung cancer that was initially diagnosed as stage IB (T2 a, N0, M0) non-small cell lung cancer, adenocarcinoma diagnosed in September 2020. She is status post left upper lobectomy with lymph node dissection with tumor size of 3.2 cm and negative lymphadenopathy at that time. She had disease recurrence in July 2022 with anterior mediastinal lymphadenopathy which was consistent with metastatic non-small cell lung cancer.    Biomarker Findings Tumor Mutational Burden - 21 Muts/Mb Microsatellite status - MS-Stable Genomic Findings For a complete list of the genes assayed, please refer to the Appendix. KRAS G12V TP53 F134L 7 Disease relevant genes with no reportable alterations: ALK, BRAF, EGFR, ERBB2, MET, RET, ROS1   PDL1: 0%  PRIOR THERAPY: 1)  Left upper lobectomy under the care of Dr. Cliffton Asters in November 2020. 2) SBRT to the disease recurrence in the anterior mediastinal lymphadenopathy under the care of Dr. Mitzi Hansen. Last treatment 12/16/20.  CURRENT THERAPY: Concurrent chemoradiation with carboplatin for an AUC of 2 and Taxol 45 mg/m2. First dose expected on 05/08/2022.  Status post 6 cycles.    INTERVAL HISTORY: Jennifer [name] 77 y.o. female returns to the clinic today for a follow-up visit accompanied by her daughter.  The patient is currently undergoing treatment with concurrent chemoradiation. She is status post 6 cycles of chemotherapy and she is tolerating well without any concerning adverse side effects except she did note that her hair is thinning. Her last day of radiation is tentatively scheduled for 06/21/2022. She has mild esophagitis from radiation and received a prescription of carafate which she has not needed to use yet. She is still able to eat. Since last being seen, she denies any fever, chills, night sweats, or unexplained  weight loss. She has had some mild weight loss progressively. She reports some taste alterations. Denies any chest pain, shortness of breath, cough, or hemoptysis.  Denies any nausea, vomiting, or diarrhea.  She is able to manage constipation. Denies any headache or visual changes.  Denies any peripheral neuropathy.  She is here today for evaluation and repeat blood work before undergoing cycle #7.   MEDICAL HISTORY: Past Medical History:  Diagnosis Date   Cancer (HCC)    Lung cancer   History of chicken pox    Hyperlipidemia    Hypertension    Menopause    Osteoporosis    Pneumonia    "walking" pneumonia   Pre-diabetes    Vitamin D deficiency     ALLERGIES:  is allergic to aspirin and latex.  MEDICATIONS:  Current Outpatient Medications  Medication Sig Dispense Refill   atorvastatin (LIPITOR) 40 MG tablet Take 40 mg by mouth every evening.      diltiazem (DILACOR XR) 240 MG 24 hr capsule Take 240 mg by mouth daily.     guaiFENesin (MUCINEX) 600 MG 12 hr tablet Take 1 tablet (600 mg total) by mouth 2 (two) times daily. (Patient taking differently: Take 600 mg by mouth daily.)     metoprolol succinate (TOPROL-XL) 50 MG 24 hr tablet Take 50 mg by mouth daily.     Multiple Vitamin (MULTIVITAMIN WITH MINERALS) TABS tablet Take 1 tablet by mouth daily.     prochlorperazine (COMPAZINE) 10 MG tablet Take 1 tablet (10 mg total) by mouth every 6 (six) hours as needed. 30 tablet 2   sucralfate (CARAFATE) 1 g tablet Take 1 tablet (1  g total) by mouth 4 (four) times daily. Dissolve each tablet in 15 cc water before use. (Patient not taking: Reported on 06/18/2022) 120 tablet 2   triamterene-hydrochlorothiazide (MAXZIDE-25) 37.5-25 MG tablet Take 1 tablet by mouth daily.     No current facility-administered medications for this visit.    SURGICAL HISTORY:  Past Surgical History:  Procedure Laterality Date   CHEST TUBE INSERTION Left 01/19/2019   Procedure: Chest Tube Insertion;  Surgeon:  Corliss Skains, MD;  Location: [facility] OR;  Service: Thoracic;  Laterality: Left;   PLEURADESIS N/A 01/06/2019   Procedure: Pleuradesis - Chemical;  Surgeon: Corliss Skains, MD;  Location: [facility] OR;  Service: Thoracic;  Laterality: N/A;   PLEURADESIS Left 01/19/2019   Procedure: Mechanical Pleuradesis;  Surgeon: Corliss Skains, MD;  [facility] OR;  Service: Thoracic;  Laterality: Left;   VIDEO ASSISTED THORACOSCOPY Left 01/19/2019   Procedure: VIDEO ASSISTED THORACOSCOPY;  Surgeon: Corliss Skains, MD;  Location: [facility] OR;  Service: Thoracic;  Laterality: Left;   VIDEO ASSISTED THORACOSCOPY (VATS)/ LOBECTOMY Left 12/29/2018   Procedure: VIDEO ASSISTED THORACOSCOPY (VATS)/LEFT UPPER  LOBECTOMY with Mediastinal lymph node exploration.;  Surgeon: Corliss Skains, MD;  Location: [facility] OR;  Service: Thoracic;  Laterality: Left;   VIDEO BRONCHOSCOPY N/A 12/29/2018   Procedure: VIDEO BRONCHOSCOPY;  Surgeon: Corliss Skains, MD;  Location: [facility] OR;  Service: Thoracic;  Laterality: N/A;   VIDEO BRONCHOSCOPY WITH ENDOBRONCHIAL NAVIGATION N/A 12/09/2018   Procedure: VIDEO BRONCHOSCOPY WITH ENDOBRONCHIAL NAVIGATION;  Surgeon: Corliss Skains, MD;  Location: [facility] OR;  Service: Thoracic;  Laterality: N/A;   VIDEO BRONCHOSCOPY WITH INSERTION OF INTERBRONCHIAL VALVE (IBV) N/A 01/06/2019   Procedure: VIDEO BRONCHOSCOPY WITH INSERTION OF INTERBRONCHIAL VALVE (IBV);  Surgeon: Corliss Skains, MD;  Location: [facility] OR;  Service: Thoracic;  Laterality: N/A;   VIDEO BRONCHOSCOPY WITH INSERTION OF INTERBRONCHIAL VALVE (IBV) N/A 01/13/2019   Procedure: VIDEO BRONCHOSCOPY WITH INSERTION OF INTERBRONCHIAL VALVE (IBV);  Surgeon: Corliss Skains, MD;  Location: [facility] OR;  Service: Thoracic;  Laterality: N/A;   VIDEO BRONCHOSCOPY WITH INSERTION OF INTERBRONCHIAL VALVE (IBV) N/A 02/23/2019   Procedure: VIDEO BRONCHOSCOPY WITH REMOVAL OF INTERBRONCHIAL VALVE (IBV);  Surgeon: Corliss Skains, MD;   Location: [facility] OR;  Service: Thoracic;  Laterality: N/A;    REVIEW OF SYSTEMS:   Review of Systems  Constitutional:  Positive for mild weight loss. Negative for chills, fatigue,  and fever.  HENT: Positive for taste alterations and mild esophagitis. Negative for mouth sores or nosebleeds.  Eyes: Negative for eye problems and icterus.  Respiratory: Negative for cough, hemoptysis, shortness of breath and wheezing.   Cardiovascular: Negative for chest pain and leg swelling.  Gastrointestinal: Negative for abdominal pain, constipation, diarrhea, nausea and vomiting.  Genitourinary: Negative for bladder incontinence, difficulty urinating, dysuria, frequency and hematuria.   Musculoskeletal: Negative for back pain, gait problem, neck pain and neck stiffness.  Skin: Negative for itching and rash.  Neurological: Negative for dizziness, extremity weakness, gait problem, headaches, light-headedness and seizures.  Hematological: Negative for adenopathy. Does not bruise/bleed easily.  Psychiatric/Behavioral: Negative for confusion, depression and sleep disturbance. The patient is not nervous/anxious.     PHYSICAL EXAMINATION:  Blood pressure 124/69, pulse (!) 109, temperature 98 F (36.7 C), temperature source Oral, resp. rate 17, weight 167 lb 3.2 oz (75.8 kg), SpO2 100 %.  ECOG PERFORMANCE STATUS: 1  Physical Exam  Constitutional: Oriented to person, place, and time and well-developed, well-nourished, and in no distress.  HENT:  Head: Normocephalic and atraumatic.  Mouth/Throat: Oropharynx is clear and moist. No oropharyngeal exudate.  Eyes: Conjunctivae are normal. Right eye exhibits no discharge. Left eye exhibits no discharge. No scleral icterus.  Neck: Normal range of motion. Neck supple.  Cardiovascular: Normal rate, regular rhythm, normal heart sounds and intact distal pulses.   Pulmonary/Chest: Effort normal and breath sounds normal. No respiratory distress. No wheezes. No rales.   Abdominal: Soft. Bowel sounds are normal. Exhibits no distension and no mass. There is no tenderness.  Musculoskeletal: Normal range of motion. Exhibits no edema.  Lymphadenopathy:    No cervical adenopathy.  Neurological: Alert and oriented to person, place, and time. Exhibits normal muscle tone. Gait normal. Coordination normal.  Skin: Skin is warm and dry. No rash noted. Not diaphoretic. No erythema. No pallor.  Psychiatric: Mood, memory and judgment normal.  Vitals reviewed.  LABORATORY DATA: Lab Results  Component Value Date   WBC 2.3 (L) 06/18/2022   HGB 10.8 (L) 06/18/2022   HCT 30.1 (L) 06/18/2022   MCV 91.2 06/18/2022   PLT 122 (L) 06/18/2022      Chemistry      Component Value Date/Time   NA 137 06/11/2022 1151   K 3.2 (L) 06/11/2022 1151   CL 99 06/11/2022 1151   CO2 28 06/11/2022 1151   BUN 19 06/11/2022 1151   CREATININE 1.10 (H) 06/11/2022 1151   GLU 845 10/28/2009 0000      Component Value Date/Time   CALCIUM 9.5 06/11/2022 1151   ALKPHOS 96 06/11/2022 1151   AST 18 06/11/2022 1151   ALT 22 06/11/2022 1151   BILITOT 0.8 06/11/2022 1151       RADIOGRAPHIC STUDIES:  No results found.   ASSESSMENT/PLAN:  This is a very pleasant 77 year old Caucasian female with recurrent non-small cell lung cancer that was initially diagnosed with stage Ib (T2 a, N0, M0) non-small cell lung cancer, adenocarcinoma.  She was initially diagnosed in September of 2020.  She was initially diagnosed with a left upper lobe lesion status post left upper lobectomy lymph node dissection with a tumor size of 3.2 and negative lymphadenopathy at that time.  The patient and September 2022 was found to have disease recurrence with biopsy-proven metastatic non-small cell lung cancer in the mediastinal lymph node.  PD-L1 expression is 0%.  She is negative for any actionable mutations.     She completed SBRT to the disease recurrence on 12/16/20 under the care of Dr. Mitzi Hansen.    She had a  restaging CT scan that showed enlarging AP window lymph node in January 2024. The PET CT in February 2024 confirmed the AP window lymph node is significantly hypermetabolic.  Therefore, this is compatible with disease recurrence.    Therefore, the patient is currently undergoing a course of concurrent chemoradiation with carboplatin for an AUC of 2 and paclitaxel 45 mg/m.  She is status post 6 cycles and is tolerating it well.  Her last day radiation is scheduled for 06/21/2022.     Labs were reviewed.  Recommend that she proceed with cycle #7 today as schedule.  Her last day radiation is to actively scheduled for this Thursday on 06/21/2022.  I will arrange for restaging CT scan approximately 3 weeks after she completes radiation.  We will then see her back for follow-up visit a few days later to go over the results and discuss the next steps in her care.  For her taste alterations, she was advised to use salt  water rinses and biotene. We also discussed that the taste alterations will eventually improve as she is about to finish chemotherapy. She has some mild hair thinning. Discussed this will also improve as today is her last chemotherapy.   The patient was advised to call immediately if she has any concerning symptoms in the interval. The patient voices understanding of current disease status and treatment options and is in agreement with the current care plan. All questions were answered. The patient knows to call the clinic with any problems, questions or concerns. We can certainly see the patient much sooner if necessary    Orders Placed This Encounter  Procedures   CT Chest W Contrast    Standing Status:   Future    Standing Expiration Date:   06/18/2023    Order Specific Question:   If indicated for the ordered procedure, I authorize the administration of contrast media per Radiology protocol    Answer:   Yes    Order Specific Question:   Does the patient have a contrast media/X-ray dye  allergy?    Answer:   No    Order Specific Question:   Preferred imaging location?    Answer:   [facility]     The total time spent in the appointment was 20-29 minutes.   Jordany Russett L Tekeya Geffert, PA-C 06/18/22

## 2022-06-14 ENCOUNTER — Ambulatory Visit
Admission: RE | Admit: 2022-06-14 | Discharge: 2022-06-14 | Disposition: A | Discharge status: Home / Self Care | Payer: Medicare Other | Source: Ambulatory Visit | Attending: Radiation Oncology | Admitting: Radiation Oncology

## 2022-06-14 ENCOUNTER — Other Ambulatory Visit: Payer: Self-pay

## 2022-06-14 DIAGNOSIS — Z510 Encounter for antineoplastic radiation therapy: Secondary | ICD-10-CM | POA: Diagnosis not present

## 2022-06-14 LAB — RAD ONC ARIA SESSION SUMMARY
Course Elapsed Days: 37
Plan Fractions Treated to Date: 28
Plan Prescribed Dose Per Fraction: 2 Gy
Plan Total Fractions Prescribed: 30
Plan Total Prescribed Dose: 60 Gy
Reference Point Dosage Given to Date: 56 Gy
Reference Point Session Dosage Given: 2 Gy
Session Number: 28

## 2022-06-15 ENCOUNTER — Ambulatory Visit
Admission: RE | Admit: 2022-06-15 | Discharge: 2022-06-15 | Disposition: A | Discharge status: Home / Self Care | Payer: Medicare Other | Source: Ambulatory Visit | Attending: Radiation Oncology | Admitting: Radiation Oncology

## 2022-06-15 ENCOUNTER — Other Ambulatory Visit: Payer: Self-pay

## 2022-06-15 DIAGNOSIS — Z510 Encounter for antineoplastic radiation therapy: Secondary | ICD-10-CM | POA: Diagnosis not present

## 2022-06-15 LAB — RAD ONC ARIA SESSION SUMMARY
Course Elapsed Days: 38
Plan Fractions Treated to Date: 29
Plan Prescribed Dose Per Fraction: 2 Gy
Plan Total Fractions Prescribed: 30
Plan Total Prescribed Dose: 60 Gy
Reference Point Dosage Given to Date: 58 Gy
Reference Point Session Dosage Given: 2 Gy
Session Number: 29

## 2022-06-15 MED FILL — Dexamethasone Sodium Phosphate Inj 100 MG/10ML: INTRAMUSCULAR | Qty: 1 | Status: AC

## 2022-06-18 ENCOUNTER — Other Ambulatory Visit: Payer: Self-pay

## 2022-06-18 ENCOUNTER — Ambulatory Visit
Admission: RE | Admit: 2022-06-18 | Discharge: 2022-06-18 | Disposition: A | Discharge status: Home / Self Care | Payer: Medicare Other | Source: Ambulatory Visit | Attending: Radiation Oncology | Admitting: Radiation Oncology

## 2022-06-18 ENCOUNTER — Inpatient Hospital Stay: Payer: Medicare Other

## 2022-06-18 ENCOUNTER — Inpatient Hospital Stay (HOSPITAL_BASED_OUTPATIENT_CLINIC_OR_DEPARTMENT_OTHER): Payer: Medicare Other | Admitting: Physician Assistant

## 2022-06-18 VITALS — BP 156/74 | HR 81 | Resp 16

## 2022-06-18 VITALS — BP 124/69 | HR 109 | Temp 98.0°F | Resp 17 | Wt 167.2 lb

## 2022-06-18 DIAGNOSIS — C3490 Malignant neoplasm of unspecified part of unspecified bronchus or lung: Secondary | ICD-10-CM | POA: Diagnosis not present

## 2022-06-18 DIAGNOSIS — Z5111 Encounter for antineoplastic chemotherapy: Secondary | ICD-10-CM

## 2022-06-18 DIAGNOSIS — Z510 Encounter for antineoplastic radiation therapy: Secondary | ICD-10-CM | POA: Diagnosis not present

## 2022-06-18 LAB — CMP (CANCER CENTER ONLY)
ALT: 23 U/L (ref 0–44)
AST: 20 U/L (ref 15–41)
Albumin: 4.3 g/dL (ref 3.5–5.0)
Alkaline Phosphatase: 92 U/L (ref 38–126)
Anion gap: 11 (ref 5–15)
BUN: 21 mg/dL (ref 8–23)
CO2: 27 mmol/L (ref 22–32)
Calcium: 9.5 mg/dL (ref 8.9–10.3)
Chloride: 99 mmol/L (ref 98–111)
Creatinine: 1.14 mg/dL — ABNORMAL HIGH (ref 0.44–1.00)
GFR, Estimated: 50 mL/min — ABNORMAL LOW (ref >60)
Glucose, Bld: 115 mg/dL — ABNORMAL HIGH (ref 70–99)
Potassium: 3.2 mmol/L — ABNORMAL LOW (ref 3.5–5.1)
Sodium: 137 mmol/L (ref 135–145)
Total Bilirubin: 0.8 mg/dL (ref 0.3–1.2)
Total Protein: 6.9 g/dL (ref 6.5–8.1)

## 2022-06-18 LAB — RAD ONC ARIA SESSION SUMMARY
Course Elapsed Days: 41
Plan Fractions Treated to Date: 30
Plan Prescribed Dose Per Fraction: 2 Gy
Plan Total Fractions Prescribed: 30
Plan Total Prescribed Dose: 60 Gy
Reference Point Dosage Given to Date: 60 Gy
Reference Point Session Dosage Given: 2 Gy
Session Number: 30

## 2022-06-18 LAB — CBC WITH DIFFERENTIAL (CANCER CENTER ONLY)
Abs Immature Granulocytes: 0 10*3/uL (ref 0.00–0.07)
Basophils Absolute: 0 10*3/uL (ref 0.0–0.1)
Basophils Relative: 0 %
Eosinophils Absolute: 0 10*3/uL (ref 0.0–0.5)
Eosinophils Relative: 0 %
HCT: 30.1 % — ABNORMAL LOW (ref 36.0–46.0)
Hemoglobin: 10.8 g/dL — ABNORMAL LOW (ref 12.0–15.0)
Immature Granulocytes: 0 %
Lymphocytes Relative: 24 %
Lymphs Abs: 0.6 10*3/uL — ABNORMAL LOW (ref 0.7–4.0)
MCH: 32.7 pg (ref 26.0–34.0)
MCHC: 35.9 g/dL (ref 30.0–36.0)
MCV: 91.2 fL (ref 80.0–100.0)
Monocytes Absolute: 0.2 10*3/uL (ref 0.1–1.0)
Monocytes Relative: 8 %
Neutro Abs: 1.6 10*3/uL — ABNORMAL LOW (ref 1.7–7.7)
Neutrophils Relative %: 68 %
Platelet Count: 122 10*3/uL — ABNORMAL LOW (ref 150–400)
RBC: 3.3 MIL/uL — ABNORMAL LOW (ref 3.87–5.11)
RDW: 13.6 % (ref 11.5–15.5)
WBC Count: 2.3 10*3/uL — ABNORMAL LOW (ref 4.0–10.5)
nRBC: 0 % (ref 0.0–0.2)

## 2022-06-18 MED ORDER — SODIUM CHLORIDE 0.9 % IV SOLN
45.0000 mg/m2 | Freq: Once | INTRAVENOUS | Status: AC
  Administered 2022-06-18: 90 mg via INTRAVENOUS
  Filled 2022-06-18: qty 15

## 2022-06-18 MED ORDER — SODIUM CHLORIDE 0.9 % IV SOLN
10.0000 mg | Freq: Once | INTRAVENOUS | Status: AC
  Administered 2022-06-18: 10 mg via INTRAVENOUS
  Filled 2022-06-18: qty 10

## 2022-06-18 MED ORDER — SODIUM CHLORIDE 0.9 % IV SOLN
150.0000 mg | Freq: Once | INTRAVENOUS | Status: AC
  Administered 2022-06-18: 150 mg via INTRAVENOUS
  Filled 2022-06-18: qty 15

## 2022-06-18 MED ORDER — FAMOTIDINE IN NACL 20-0.9 MG/50ML-% IV SOLN
20.0000 mg | Freq: Once | INTRAVENOUS | Status: AC
  Administered 2022-06-18: 20 mg via INTRAVENOUS
  Filled 2022-06-18: qty 50

## 2022-06-18 MED ORDER — SODIUM CHLORIDE 0.9 % IV SOLN: Freq: Once | INTRAVENOUS | Status: AC

## 2022-06-18 MED ORDER — DIPHENHYDRAMINE HCL 50 MG/ML IJ SOLN
50.0000 mg | Freq: Once | INTRAMUSCULAR | Status: AC
  Administered 2022-06-18: 50 mg via INTRAVENOUS
  Filled 2022-06-18: qty 1

## 2022-06-18 MED ORDER — PALONOSETRON HCL INJECTION 0.25 MG/5ML
0.2500 mg | Freq: Once | INTRAVENOUS | Status: AC
  Administered 2022-06-18: 0.25 mg via INTRAVENOUS
  Filled 2022-06-18: qty 5

## 2022-06-18 NOTE — Patient Instructions (Signed)
[facility]  Discharge Instructions: Thank you for choosing [facility] to provide your oncology and hematology care.   If you have a lab appointment with the Cancer Center, please go directly to the Cancer Center and check in at the registration area.   Wear comfortable clothing and clothing appropriate for easy access to any Portacath or PICC line.   We strive to give you quality time with your provider. You may need to reschedule your appointment if you arrive late (15 or more minutes).  Arriving late affects you and other patients whose appointments are after yours.  Also, if you miss three or more appointments without notifying the office, you may be dismissed from the clinic at the provider's discretion.      For prescription refill requests, have your pharmacy contact our office and allow 72 hours for refills to be completed.    Today you received the following chemotherapy and/or immunotherapy agents: Paclitaxel, Carboplatin.       To help prevent nausea and vomiting after your treatment, we encourage you to take your nausea medication as directed.  BELOW ARE SYMPTOMS THAT SHOULD BE REPORTED IMMEDIATELY: *FEVER GREATER THAN 100.4 F (38 C) OR HIGHER *CHILLS OR SWEATING *NAUSEA AND VOMITING THAT IS NOT CONTROLLED WITH YOUR NAUSEA MEDICATION *UNUSUAL SHORTNESS OF BREATH *UNUSUAL BRUISING OR BLEEDING *URINARY PROBLEMS (pain or burning when urinating, or frequent urination) *BOWEL PROBLEMS (unusual diarrhea, constipation, pain near the anus) TENDERNESS IN MOUTH AND THROAT WITH OR WITHOUT PRESENCE OF ULCERS (sore throat, sores in mouth, or a toothache) UNUSUAL RASH, SWELLING OR PAIN  UNUSUAL VAGINAL DISCHARGE OR ITCHING   Items with * indicate a potential emergency and should be followed up as soon as possible or go to the Emergency Department if any problems should occur.  Please show the CHEMOTHERAPY ALERT CARD or IMMUNOTHERAPY  ALERT CARD at check-in to the Emergency Department and triage nurse.  Should you have questions after your visit or need to cancel or reschedule your appointment, please contact [facility]  Dept: [phone]  and follow the prompts.  Office hours are 8:00 a.m. to 4:30 p.m. Monday - Friday. Please note that voicemails left after 4:00 p.m. may not be returned until the following business day.  We are closed weekends and major holidays. You have access to a nurse at all times for urgent questions. Please call the main number to the clinic Dept: [phone] and follow the prompts.   For any non-urgent questions, you may also contact your provider using MyChart. We now offer e-Visits for anyone 18 and older to request care online for non-urgent symptoms. For details visit [email or web address].   Also download the MyChart app! Go to the app store, search "MyChart", open the app, select , and log in with your MyChart username and password.   

## 2022-06-19 ENCOUNTER — Other Ambulatory Visit: Payer: Self-pay

## 2022-06-19 ENCOUNTER — Ambulatory Visit
Admission: RE | Admit: 2022-06-19 | Discharge: 2022-06-19 | Disposition: A | Discharge status: Home / Self Care | Payer: Medicare Other | Source: Ambulatory Visit | Attending: Radiation Oncology | Admitting: Radiation Oncology

## 2022-06-19 DIAGNOSIS — Z510 Encounter for antineoplastic radiation therapy: Secondary | ICD-10-CM | POA: Diagnosis not present

## 2022-06-19 LAB — RAD ONC ARIA SESSION SUMMARY
Course Elapsed Days: 42
Plan Fractions Treated to Date: 1
Plan Prescribed Dose Per Fraction: 2 Gy
Plan Total Fractions Prescribed: 3
Plan Total Prescribed Dose: 6 Gy
Reference Point Dosage Given to Date: 2 Gy
Reference Point Session Dosage Given: 2 Gy
Session Number: 31

## 2022-06-20 ENCOUNTER — Ambulatory Visit
Admission: RE | Admit: 2022-06-20 | Discharge: 2022-06-20 | Disposition: A | Discharge status: Home / Self Care | Payer: Medicare Other | Source: Ambulatory Visit | Attending: Radiation Oncology | Admitting: Radiation Oncology

## 2022-06-20 ENCOUNTER — Other Ambulatory Visit: Payer: Self-pay

## 2022-06-20 DIAGNOSIS — Z510 Encounter for antineoplastic radiation therapy: Secondary | ICD-10-CM | POA: Diagnosis not present

## 2022-06-20 LAB — RAD ONC ARIA SESSION SUMMARY
Course Elapsed Days: 43
Plan Fractions Treated to Date: 2
Plan Prescribed Dose Per Fraction: 2 Gy
Plan Total Fractions Prescribed: 3
Plan Total Prescribed Dose: 6 Gy
Reference Point Dosage Given to Date: 4 Gy
Reference Point Session Dosage Given: 2 Gy
Session Number: 32

## 2022-06-21 ENCOUNTER — Other Ambulatory Visit: Payer: Self-pay

## 2022-06-21 ENCOUNTER — Ambulatory Visit
Admission: RE | Admit: 2022-06-21 | Discharge: 2022-06-21 | Disposition: A | Discharge status: Home / Self Care | Payer: Medicare Other | Source: Ambulatory Visit | Attending: Radiation Oncology | Admitting: Radiation Oncology

## 2022-06-21 ENCOUNTER — Telehealth: Payer: Self-pay | Admitting: Internal Medicine

## 2022-06-21 DIAGNOSIS — Z510 Encounter for antineoplastic radiation therapy: Secondary | ICD-10-CM | POA: Diagnosis not present

## 2022-06-21 LAB — RAD ONC ARIA SESSION SUMMARY
Course Elapsed Days: 44
Plan Fractions Treated to Date: 3
Plan Prescribed Dose Per Fraction: 2 Gy
Plan Total Fractions Prescribed: 3
Plan Total Prescribed Dose: 6 Gy
Reference Point Dosage Given to Date: 6 Gy
Reference Point Session Dosage Given: 2 Gy
Session Number: 33

## 2022-06-21 NOTE — Telephone Encounter (Signed)
Scheduled per 04/22 los, patient has been called and notified.  

## 2022-06-24 NOTE — Radiation Completion Notes (Signed)
  Radiation Oncology         [phone]) [phone] ________________________________  Name: Jennifer [name] MRN: [id number]  Date of Service: 06/21/2022  DOB: 15-Dec-1945  End of Treatment Note    Diagnosis: Recurrent Stage IB, cT2aN0M0, NSCLC, of the left upper lobe with disease in the mediastinum   Intent: Curative; Re-irradiation     ==========DELIVERED PLANS==========  First Treatment Date: 2022-05-08 - Last Treatment Date: 2022-06-21   Plan Name: Chest Site: Mediastinum Technique: 3D Mode: Photon Dose Per Fraction: 2 Gy Prescribed Dose (Delivered / Prescribed): 60 Gy / 60 Gy Prescribed Fxs (Delivered / Prescribed): 30 / 30   Plan Name: Chest_Bst Site: Mediastinum Technique: 3D Mode: Photon Dose Per Fraction: 2 Gy Prescribed Dose (Delivered / Prescribed): 6 Gy / 6 Gy Prescribed Fxs (Delivered / Prescribed): 3 / 3     ==========ON TREATMENT VISIT DATES========== 2022-05-11, 2022-05-17, 2022-05-25, 2022-06-01, 2022-06-08, 2022-06-15, 2022-06-21    See weekly On Treatment Notes is Epic for details. The patient tolerated radiation. She developed fatigue and radiation esophagitis which was managed with carafate.  The patient will receive a call in about one month from the radiation oncology department. She will continue follow up with Dr. Arbutus Ped as well.      Osker Mason, PAC

## 2022-07-12 ENCOUNTER — Ambulatory Visit (HOSPITAL_COMMUNITY)
Admission: RE | Admit: 2022-07-12 | Discharge: 2022-07-12 | Disposition: A | Discharge status: Home / Self Care | Payer: Medicare Other | Source: Ambulatory Visit | Attending: Physician Assistant | Admitting: Physician Assistant

## 2022-07-12 DIAGNOSIS — C3490 Malignant neoplasm of unspecified part of unspecified bronchus or lung: Secondary | ICD-10-CM | POA: Diagnosis not present

## 2022-07-12 MED ORDER — SODIUM CHLORIDE (PF) 0.9 % IJ SOLN
INTRAMUSCULAR | Status: AC
  Filled 2022-07-12: qty 50

## 2022-07-12 MED ORDER — IOHEXOL 300 MG/ML  SOLN
100.0000 mL | Freq: Once | INTRAMUSCULAR | Status: AC | PRN
  Administered 2022-07-12: 75 mL via INTRAVENOUS

## 2022-07-16 ENCOUNTER — Inpatient Hospital Stay (HOSPITAL_BASED_OUTPATIENT_CLINIC_OR_DEPARTMENT_OTHER): Payer: Medicare Other | Admitting: Internal Medicine

## 2022-07-16 ENCOUNTER — Encounter: Payer: Self-pay | Admitting: Internal Medicine

## 2022-07-16 ENCOUNTER — Telehealth: Payer: Self-pay

## 2022-07-16 ENCOUNTER — Inpatient Hospital Stay: Payer: Medicare Other | Attending: Internal Medicine

## 2022-07-16 VITALS — BP 120/77 | HR 113 | Temp 98.5°F | Resp 17 | Wt 164.4 lb

## 2022-07-16 DIAGNOSIS — C3412 Malignant neoplasm of upper lobe, left bronchus or lung: Secondary | ICD-10-CM | POA: Insufficient documentation

## 2022-07-16 DIAGNOSIS — Z79620 Long term (current) use of immunosuppressive biologic: Secondary | ICD-10-CM | POA: Insufficient documentation

## 2022-07-16 DIAGNOSIS — L659 Nonscarring hair loss, unspecified: Secondary | ICD-10-CM | POA: Insufficient documentation

## 2022-07-16 DIAGNOSIS — C3490 Malignant neoplasm of unspecified part of unspecified bronchus or lung: Secondary | ICD-10-CM

## 2022-07-16 DIAGNOSIS — Z902 Acquired absence of lung [part of]: Secondary | ICD-10-CM | POA: Insufficient documentation

## 2022-07-16 DIAGNOSIS — Z5112 Encounter for antineoplastic immunotherapy: Secondary | ICD-10-CM | POA: Insufficient documentation

## 2022-07-16 LAB — CMP (CANCER CENTER ONLY)
ALT: 14 U/L (ref 0–44)
AST: 16 U/L (ref 15–41)
Albumin: 4.3 g/dL (ref 3.5–5.0)
Alkaline Phosphatase: 90 U/L (ref 38–126)
Anion gap: 10 (ref 5–15)
BUN: 24 mg/dL — ABNORMAL HIGH (ref 8–23)
CO2: 29 mmol/L (ref 22–32)
Calcium: 9.4 mg/dL (ref 8.9–10.3)
Chloride: 102 mmol/L (ref 98–111)
Creatinine: 1.52 mg/dL — ABNORMAL HIGH (ref 0.44–1.00)
GFR, Estimated: 36 mL/min — ABNORMAL LOW (ref >60)
Glucose, Bld: 116 mg/dL — ABNORMAL HIGH (ref 70–99)
Potassium: 3.6 mmol/L (ref 3.5–5.1)
Sodium: 141 mmol/L (ref 135–145)
Total Bilirubin: 0.5 mg/dL (ref 0.3–1.2)
Total Protein: 6.9 g/dL (ref 6.5–8.1)

## 2022-07-16 LAB — CBC WITH DIFFERENTIAL (CANCER CENTER ONLY)
Abs Immature Granulocytes: 0.02 10*3/uL (ref 0.00–0.07)
Basophils Absolute: 0 10*3/uL (ref 0.0–0.1)
Basophils Relative: 0 %
Eosinophils Absolute: 0.1 10*3/uL (ref 0.0–0.5)
Eosinophils Relative: 2 %
HCT: 29.2 % — ABNORMAL LOW (ref 36.0–46.0)
Hemoglobin: 9.8 g/dL — ABNORMAL LOW (ref 12.0–15.0)
Immature Granulocytes: 0 %
Lymphocytes Relative: 22 %
Lymphs Abs: 1.5 10*3/uL (ref 0.7–4.0)
MCH: 33.7 pg (ref 26.0–34.0)
MCHC: 33.6 g/dL (ref 30.0–36.0)
MCV: 100.3 fL — ABNORMAL HIGH (ref 80.0–100.0)
Monocytes Absolute: 0.7 10*3/uL (ref 0.1–1.0)
Monocytes Relative: 11 %
Neutro Abs: 4.4 10*3/uL (ref 1.7–7.7)
Neutrophils Relative %: 65 %
Platelet Count: 201 10*3/uL (ref 150–400)
RBC: 2.91 MIL/uL — ABNORMAL LOW (ref 3.87–5.11)
RDW: 18.1 % — ABNORMAL HIGH (ref 11.5–15.5)
WBC Count: 6.8 10*3/uL (ref 4.0–10.5)
nRBC: 0 % (ref 0.0–0.2)

## 2022-07-16 NOTE — Telephone Encounter (Signed)
This nurse reached out to patient and made her aware that her lab results show that she needs to increase her hydration.  Patient acknowledged understanding.  Patient also asked if a My Chart message could be sent with the name of the Immunotherapy drug that she is going to be taking and possible side effects.  This nurse acknowledged.  No further questions or concerns noted at this time.

## 2022-07-16 NOTE — Progress Notes (Signed)
DISCONTINUE OFF PATHWAY REGIMEN - Non-Small Cell Lung   [id number]:Carboplatin AUC=2 IV D1 + Paclitaxel 45 mg/m2 IV D1 q7 Days + RT:   A cycle is every 7 days, concurrent with RT:     Paclitaxel      Carboplatin   **Always confirm dose/schedule in your pharmacy ordering system**  REASON: Other Reason PRIOR TREATMENT: Off Pathway: Carboplatin AUC=2 IV D1 + Paclitaxel 45 mg/m2 IV D1 q7 Days + RT TREATMENT RESPONSE: Partial Response (PR)  START OFF PATHWAY REGIMEN - Non-Small Cell Lung   [id number]:Durvalumab 1,500 mg IV D1 q28 Days:   A cycle is every 28 days:     Durvalumab   **Always confirm dose/schedule in your pharmacy ordering system**  Patient Characteristics: Local Recurrence Therapeutic Status: Local Recurrence Intent of Therapy: Curative Intent, Discussed with Patient

## 2022-07-16 NOTE — Progress Notes (Signed)
[facility] [phone]   [phone]  OFFICE PROGRESS NOTE  Felix Pacini, FNP [address] [place] [address]  DIAGNOSIS: Recurrent non-small cell lung cancer that was initially diagnosed as stage IB (T2 a, N0, M0) non-small cell lung cancer, adenocarcinoma diagnosed in September 2020. She is status post left upper lobectomy with lymph node dissection with tumor size of 3.2 cm and negative lymphadenopathy at that time. She had disease recurrence in July 2022 with anterior mediastinal lymphadenopathy which was consistent with metastatic non-small cell lung cancer.    Biomarker Findings Tumor Mutational Burden - 21 Muts/Mb Microsatellite status - MS-Stable Genomic Findings For a complete list of the genes assayed, please refer to the Appendix. KRAS G12V TP53 F134L 7 Disease relevant genes with no reportable alterations: ALK, BRAF, EGFR, ERBB2, MET, RET, ROS1   PDL1: 0%   PRIOR THERAPY: 1)  Left upper lobectomy under the care of Dr. Cliffton Asters in November 2020. 2) SBRT to the disease recurrence in the anterior mediastinal lymphadenopathy under the care of Dr. Mitzi Hansen. Last treatment 12/16/20. 3) Concurrent chemoradiation with carboplatin for an AUC of 2 and Taxol 45 mg/m2. First dose expected on 05/08/2022.  Status post 7 cycles with partial response.  CURRENT THERAPY: Consolidation treatment with immunotherapy with Imfinzi 1500 Mg IV every 4 weeks.  First dose Jul 26, 2022.  INTERVAL HISTORY: Jennifer [name] 77 y.o. female returns to the clinic today for follow-up visit accompanied by her Sister Victorino Dike who is visiting from [place].  The patient is feeling fine today with no concerning complaints except for the mild fatigue and soreness in her throat.  She tolerated the previous course of concurrent chemoradiation fairly well except for the fatigue mild hair loss as well as odynophagia.  She denied having any current chest pain, shortness  of breath, cough or hemoptysis.  She has no nausea, vomiting, diarrhea or constipation.  She has no headache or visual changes.  She has no recent weight loss or night sweats.  She had repeat CT scan of the chest performed recently and she is here for evaluation and discussion of her scan results.  MEDICAL HISTORY: Past Medical History:  Diagnosis Date   Cancer (HCC)    Lung cancer   History of chicken pox    Hyperlipidemia    Hypertension    Menopause    Osteoporosis    Pneumonia    "walking" pneumonia   Pre-diabetes    Vitamin D deficiency     ALLERGIES:  is allergic to aspirin and latex.  MEDICATIONS:  Current Outpatient Medications  Medication Sig Dispense Refill   sucralfate (CARAFATE) 1 g tablet Take 1 tablet (1 g total) by mouth 4 (four) times daily. Dissolve each tablet in 15 cc water before use. (Patient not taking: Reported on 06/18/2022) 120 tablet 2   atorvastatin (LIPITOR) 40 MG tablet Take 40 mg by mouth every evening.      diltiazem (DILACOR XR) 240 MG 24 hr capsule Take 240 mg by mouth daily.     guaiFENesin (MUCINEX) 600 MG 12 hr tablet Take 1 tablet (600 mg total) by mouth 2 (two) times daily. (Patient taking differently: Take 600 mg by mouth daily.)     metoprolol succinate (TOPROL-XL) 50 MG 24 hr tablet Take 50 mg by mouth daily.     Multiple Vitamin (MULTIVITAMIN WITH MINERALS) TABS tablet Take 1 tablet by mouth daily.     prochlorperazine (COMPAZINE) 10 MG tablet Take  1 tablet (10 mg total) by mouth every 6 (six) hours as needed. 30 tablet 2   triamterene-hydrochlorothiazide (MAXZIDE-25) 37.5-25 MG tablet Take 1 tablet by mouth daily.     No current facility-administered medications for this visit.    SURGICAL HISTORY:  Past Surgical History:  Procedure Laterality Date   CHEST TUBE INSERTION Left 01/19/2019   Procedure: Chest Tube Insertion;  Surgeon: Corliss Skains, MD;  Location: [facility] OR;  Service: Thoracic;  Laterality: Left;   PLEURADESIS N/A  01/06/2019   Procedure: Pleuradesis - Chemical;  Surgeon: Corliss Skains, MD;  Location: [facility] OR;  Service: Thoracic;  Laterality: N/A;   PLEURADESIS Left 01/19/2019   Procedure: Mechanical Pleuradesis;  Surgeon: Corliss Skains, MD;  Location: [facility] OR;  Service: Thoracic;  Laterality: Left;   VIDEO ASSISTED THORACOSCOPY Left 01/19/2019   Procedure: VIDEO ASSISTED THORACOSCOPY;  Surgeon: Corliss Skains, MD;  Location: [facility] OR;  Service: Thoracic;  Laterality: Left;   VIDEO ASSISTED THORACOSCOPY (VATS)/ LOBECTOMY Left 12/29/2018   Procedure: VIDEO ASSISTED THORACOSCOPY (VATS)/LEFT UPPER  LOBECTOMY with Mediastinal lymph node exploration.;  Surgeon: Corliss Skains, MD;  Location: [facility] OR;  Service: Thoracic;  Laterality: Left;   VIDEO BRONCHOSCOPY N/A 12/29/2018   Procedure: VIDEO BRONCHOSCOPY;  Surgeon: Corliss Skains, MD;  Location: [facility] OR;  Service: Thoracic;  Laterality: N/A;   VIDEO BRONCHOSCOPY WITH ENDOBRONCHIAL NAVIGATION N/A 12/09/2018   Procedure: VIDEO BRONCHOSCOPY WITH ENDOBRONCHIAL NAVIGATION;  Surgeon: Corliss Skains, MD;  Location: [facility] OR;  Service: Thoracic;  Laterality: N/A;   VIDEO BRONCHOSCOPY WITH INSERTION OF INTERBRONCHIAL VALVE (IBV) N/A 01/06/2019   Procedure: VIDEO BRONCHOSCOPY WITH INSERTION OF INTERBRONCHIAL VALVE (IBV);  Surgeon: Corliss Skains, MD;  Location: [facility] OR;  Service: Thoracic;  Laterality: N/A;   VIDEO BRONCHOSCOPY WITH INSERTION OF INTERBRONCHIAL VALVE (IBV) N/A 01/13/2019   Procedure: VIDEO BRONCHOSCOPY WITH INSERTION OF INTERBRONCHIAL VALVE (IBV);  Surgeon: Corliss Skains, MD;  Location: [facility] OR;  Service: Thoracic;  Laterality: N/A;   VIDEO BRONCHOSCOPY WITH INSERTION OF INTERBRONCHIAL VALVE (IBV) N/A 02/23/2019   Procedure: VIDEO BRONCHOSCOPY WITH REMOVAL OF INTERBRONCHIAL VALVE (IBV);  Surgeon: Corliss Skains, MD;  Location: [facility] OR;  Service: Thoracic;  Laterality: N/A;    REVIEW OF SYSTEMS:  Constitutional: positive  for fatigue Eyes: negative Ears, nose, mouth, throat, and face: negative Respiratory: negative Cardiovascular: negative Gastrointestinal: positive for odynophagia Genitourinary:negative Integument/breast: negative Hematologic/lymphatic: negative Musculoskeletal:negative Neurological: negative Behavioral/Psych: negative Endocrine: negative Allergic/Immunologic: negative   PHYSICAL EXAMINATION: General appearance: alert, cooperative, fatigued, and no distress Head: Normocephalic, without obvious abnormality, atraumatic Neck: no adenopathy, no JVD, supple, symmetrical, trachea midline, and thyroid not enlarged, symmetric, no tenderness/mass/nodules Lymph nodes: Cervical, supraclavicular, and axillary nodes normal. Resp: clear to auscultation bilaterally Back: symmetric, no curvature. ROM normal. No CVA tenderness. Cardio: regular rate and rhythm, S1, S2 normal, no murmur, click, rub or gallop GI: soft, non-tender; bowel sounds normal; no masses,  no organomegaly Extremities: extremities normal, atraumatic, no cyanosis or edema Neurologic: Alert and oriented X 3, normal strength and tone. Normal symmetric reflexes. Normal coordination and gait  ECOG PERFORMANCE STATUS: 1 - Symptomatic but completely ambulatory  Blood pressure 120/77, pulse (!) 113, temperature 98.5 F (36.9 C), temperature source Oral, resp. rate 17, weight 164 lb 6.4 oz (74.6 kg), SpO2 100 %.  LABORATORY DATA: Lab Results  Component Value Date   WBC 2.3 (L) 06/18/2022   HGB 10.8 (L) 06/18/2022   HCT 30.1 (L) 06/18/2022   MCV 91.2  06/18/2022   PLT 122 (L) 06/18/2022      Chemistry      Component Value Date/Time   NA 137 06/18/2022 0906   K 3.2 (L) 06/18/2022 0906   CL 99 06/18/2022 0906   CO2 27 06/18/2022 0906   BUN 21 06/18/2022 0906   CREATININE 1.14 (H) 06/18/2022 0906   GLU 845 10/28/2009 0000      Component Value Date/Time   CALCIUM 9.5 06/18/2022 0906   ALKPHOS 92 06/18/2022 0906   AST 20  06/18/2022 0906   ALT 23 06/18/2022 0906   BILITOT 0.8 06/18/2022 0906       RADIOGRAPHIC STUDIES: CT Chest W Contrast  Result Date: 07/16/2022 CLINICAL DATA:  Follow-up non-small cell lung cancer EXAM: CT CHEST WITH CONTRAST TECHNIQUE: Multidetector CT imaging of the chest was performed during intravenous contrast administration. RADIATION DOSE REDUCTION: This exam was performed according to the departmental dose-optimization program which includes automated exposure control, adjustment of the mA and/or kV according to patient size and/or use of iterative reconstruction technique. CONTRAST:  75mL OMNIPAQUE IOHEXOL 300 MG/ML  SOLN COMPARISON:  PET-CT dated 04/13/2022 FINDINGS: Cardiovascular: The heart is normal in size. No pericardial effusion. Leftward cardiomediastinal shift. No evidence thoracic aortic aneurysm. Atherosclerotic calcifications of the aortic arch. Mild coronary atherosclerosis of the LAD. Mediastinum/Nodes: 11 mm short axis AP window node (series 2/image 54), corresponding to the nodal metastasis on PET, previously 14 mm. Visualized thyroid is grossly unremarkable. Lungs/Pleura: Status post left upper lobectomy. Mild centrilobular emphysematous changes, upper lung predominant. No suspicious pulmonary nodules. No focal consolidation. No pleural effusion or pneumothorax. Upper Abdomen: Visualized upper abdomen is grossly unremarkable, noting vascular calcifications. Musculoskeletal: Degenerative changes of the visualized thoracolumbar spine. IMPRESSION: Status post left upper lobectomy. 11 mm short axis AP window node, corresponding to the nodal metastasis on PET, mildly improved. No evidence of new/progressive metastatic disease. Aortic Atherosclerosis (ICD10-I70.0) and Emphysema (ICD10-J43.9). Electronically Signed   By: Charline Bills M.D.   On: 07/16/2022 08:42    ASSESSMENT AND PLAN: This is a very pleasant 77 years old white female with recurrent non-small cell lung cancer that  was initially diagnosed as stage Ib (T2 a, N0, M0) adenocarcinoma diagnosed in September 2020 status post left upper lobectomy with lymph node dissection with disease recurrence in July 2022 presenting with anterior mediastinal lymphadenopathy.  The patient is status post curative radiotherapy to this area under the care of Dr. Mitzi Hansen completed December 16, 2020.   She was found to have another evidence of disease recurrence in the AP window lymph nodes in February 2024.  She has brain MRI that showed no evidence of metastatic disease to the brain. She i and her went concurrent chemoradiation with weekly carboplatin for AUC of 2 and paclitaxel 45 Mg/M2.  First dose May 08, 2022.  Status post 7 cycles.  Last dose was given on 06/18/2022 with partial response. The patient tolerated her previous treatment fairly well except for fatigue and odynophagia. She also has mild alopecia. She had repeat CT scan of the chest performed at several days ago but unfortunately the final report is still pending. I personally and independently reviewed the scan images in comparison to the previous scan and I see some improvement in the AP window lymphadenopathy but I will wait for the final report for confirmation. I discussed with the patient her next step in treatment with consolidation immunotherapy with Imfinzi 1500 Mg IV every 4 weeks. The patient is interested in the treatment.  I discussed with her the adverse effect of this treatment including but not limited to immunotherapy mediated skin rash, diarrhea, inflammation of the lung, kidney, liver, thyroid or other endocrine dysfunction. She is expected to start the first cycle of this treatment on 07/26/2022. She will come back for follow-up visit with the start of cycle #2. The patient was advised to call immediately if she has any other concerning symptoms in the interval. The patient voices understanding of current disease status and treatment options and is in  agreement with the current care plan.  All questions were answered. The patient knows to call the clinic with any problems, questions or concerns. We can certainly see the patient much sooner if necessary.  The total time spent in the appointment was 30 minutes.  Disclaimer: This note was dictated with voice recognition software. Similar sounding words can inadvertently be transcribed and may not be corrected upon review.

## 2022-07-16 NOTE — Telephone Encounter (Signed)
-----   Message from Kellogg, PA-C sent at 07/16/2022  8:59 AM EDT ----- Can you call her and tell her to hydrate more ----- Message ----- From: Interface, Lab In [place] Sent: 07/16/2022   8:39 AM EDT To: Johnette Abraham Heilingoetter, PA-C

## 2022-07-17 ENCOUNTER — Telehealth: Payer: Self-pay | Admitting: Internal Medicine

## 2022-07-17 ENCOUNTER — Other Ambulatory Visit: Payer: Self-pay

## 2022-07-17 NOTE — Telephone Encounter (Signed)
Scheduled per 05/20 los, patient has been called and notified. 

## 2022-07-24 ENCOUNTER — Other Ambulatory Visit: Payer: Self-pay

## 2022-07-26 ENCOUNTER — Inpatient Hospital Stay: Payer: Medicare Other

## 2022-07-26 VITALS — BP 112/62 | HR 73 | Temp 98.2°F | Resp 16 | Ht 69.0 in | Wt 167.5 lb

## 2022-07-26 DIAGNOSIS — C3490 Malignant neoplasm of unspecified part of unspecified bronchus or lung: Secondary | ICD-10-CM

## 2022-07-26 DIAGNOSIS — Z5112 Encounter for antineoplastic immunotherapy: Secondary | ICD-10-CM | POA: Diagnosis not present

## 2022-07-26 LAB — CMP (CANCER CENTER ONLY)
ALT: 13 U/L (ref 0–44)
AST: 18 U/L (ref 15–41)
Albumin: 4.3 g/dL (ref 3.5–5.0)
Alkaline Phosphatase: 94 U/L (ref 38–126)
Anion gap: 12 (ref 5–15)
BUN: 23 mg/dL (ref 8–23)
CO2: 26 mmol/L (ref 22–32)
Calcium: 9.4 mg/dL (ref 8.9–10.3)
Chloride: 100 mmol/L (ref 98–111)
Creatinine: 1.44 mg/dL — ABNORMAL HIGH (ref 0.44–1.00)
GFR, Estimated: 38 mL/min — ABNORMAL LOW (ref >60)
Glucose, Bld: 124 mg/dL — ABNORMAL HIGH (ref 70–99)
Potassium: 3.6 mmol/L (ref 3.5–5.1)
Sodium: 138 mmol/L (ref 135–145)
Total Bilirubin: 0.7 mg/dL (ref 0.3–1.2)
Total Protein: 7 g/dL (ref 6.5–8.1)

## 2022-07-26 LAB — CBC WITH DIFFERENTIAL (CANCER CENTER ONLY)
Abs Immature Granulocytes: 0.04 10*3/uL (ref 0.00–0.07)
Basophils Absolute: 0 10*3/uL (ref 0.0–0.1)
Basophils Relative: 0 %
Eosinophils Absolute: 0.2 10*3/uL (ref 0.0–0.5)
Eosinophils Relative: 2 %
HCT: 28.4 % — ABNORMAL LOW (ref 36.0–46.0)
Hemoglobin: 9.9 g/dL — ABNORMAL LOW (ref 12.0–15.0)
Immature Granulocytes: 1 %
Lymphocytes Relative: 15 %
Lymphs Abs: 1.3 10*3/uL (ref 0.7–4.0)
MCH: 34.4 pg — ABNORMAL HIGH (ref 26.0–34.0)
MCHC: 34.9 g/dL (ref 30.0–36.0)
MCV: 98.6 fL (ref 80.0–100.0)
Monocytes Absolute: 0.6 10*3/uL (ref 0.1–1.0)
Monocytes Relative: 7 %
Neutro Abs: 6.6 10*3/uL (ref 1.7–7.7)
Neutrophils Relative %: 75 %
Platelet Count: 193 10*3/uL (ref 150–400)
RBC: 2.88 MIL/uL — ABNORMAL LOW (ref 3.87–5.11)
RDW: 16.2 % — ABNORMAL HIGH (ref 11.5–15.5)
WBC Count: 8.7 10*3/uL (ref 4.0–10.5)
nRBC: 0 % (ref 0.0–0.2)

## 2022-07-26 LAB — TSH: TSH: 0.212 u[IU]/mL — ABNORMAL LOW (ref 0.350–4.500)

## 2022-07-26 MED ORDER — SODIUM CHLORIDE 0.9 % IV SOLN
1500.0000 mg | Freq: Once | INTRAVENOUS | Status: AC
  Administered 2022-07-26: 1500 mg via INTRAVENOUS
  Filled 2022-07-26: qty 30

## 2022-07-26 MED ORDER — SODIUM CHLORIDE 0.9 % IV SOLN: Freq: Once | INTRAVENOUS | Status: AC

## 2022-07-26 NOTE — Patient Instructions (Signed)
[facility]  Discharge Instructions: Thank you for choosing [facility] to provide your oncology and hematology care.   If you have a lab appointment with the Cancer Center, please go directly to the Cancer Center and check in at the registration area.   Wear comfortable clothing and clothing appropriate for easy access to any Portacath or PICC line.   We strive to give you quality time with your provider. You may need to reschedule your appointment if you arrive late (15 or more minutes).  Arriving late affects you and other patients whose appointments are after yours.  Also, if you miss three or more appointments without notifying the office, you may be dismissed from the clinic at the provider's discretion.      For prescription refill requests, have your pharmacy contact our office and allow 72 hours for refills to be completed.    Today you received the following chemotherapy and/or immunotherapy agents: Durvalumab      To help prevent nausea and vomiting after your treatment, we encourage you to take your nausea medication as directed.  BELOW ARE SYMPTOMS THAT SHOULD BE REPORTED IMMEDIATELY: *FEVER GREATER THAN 100.4 F (38 C) OR HIGHER *CHILLS OR SWEATING *NAUSEA AND VOMITING THAT IS NOT CONTROLLED WITH YOUR NAUSEA MEDICATION *UNUSUAL SHORTNESS OF BREATH *UNUSUAL BRUISING OR BLEEDING *URINARY PROBLEMS (pain or burning when urinating, or frequent urination) *BOWEL PROBLEMS (unusual diarrhea, constipation, pain near the anus) TENDERNESS IN MOUTH AND THROAT WITH OR WITHOUT PRESENCE OF ULCERS (sore throat, sores in mouth, or a toothache) UNUSUAL RASH, SWELLING OR PAIN  UNUSUAL VAGINAL DISCHARGE OR ITCHING   Items with * indicate a potential emergency and should be followed up as soon as possible or go to the Emergency Department if any problems should occur.  Please show the CHEMOTHERAPY ALERT CARD or IMMUNOTHERAPY ALERT CARD at  check-in to the Emergency Department and triage nurse.  Should you have questions after your visit or need to cancel or reschedule your appointment, please contact [facility]  Dept: [phone]  and follow the prompts.  Office hours are 8:00 a.m. to 4:30 p.m. Monday - Friday. Please note that voicemails left after 4:00 p.m. may not be returned until the following business day.  We are closed weekends and major holidays. You have access to a nurse at all times for urgent questions. Please call the main number to the clinic Dept: [phone] and follow the prompts.   For any non-urgent questions, you may also contact your provider using MyChart. We now offer e-Visits for anyone 45 and older to request care online for non-urgent symptoms. For details visit [email or web address].   Also download the MyChart app! Go to the app store, search "MyChart", open the app, select [facility] [place], and log in with your MyChart username and password.  Durvalumab Injection What is this medication? DURVALUMAB (dur VAL ue mab) treats some types of cancer. It works by helping your immune system slow or stop the spread of cancer cells. It is a monoclonal antibody. This medicine may be used for other purposes; ask your health care provider or pharmacist if you have questions. COMMON BRAND NAME(S): IMFINZI What should I tell my care team before I take this medication? They need to know if you have any of these conditions: Allogeneic stem cell transplant (uses someone else's stem cells) Autoimmune diseases, such as Crohn disease, ulcerative colitis, lupus History of chest radiation Nervous system  problems, such as Guillain-Barre syndrome, myasthenia gravis Organ transplant An unusual or allergic reaction to durvalumab, other medications, foods, dyes, or preservatives Pregnant or trying to get pregnant Breast-feeding How should I use this medication? This medication is infused  into a vein. It is given by your care team in a hospital or clinic setting. A special MedGuide will be given to you before each treatment. Be sure to read this information carefully each time. Talk to your care team about the use of this medication in children. Special care may be needed. Overdosage: If you think you have taken too much of this medicine contact a poison control center or emergency room at once. NOTE: This medicine is only for you. Do not share this medicine with others. What if I miss a dose? Keep appointments for follow-up doses. It is important not to miss your dose. Call your care team if you are unable to keep an appointment. What may interact with this medication? Interactions have not been studied. This list may not describe all possible interactions. Give your health care provider a list of all the medicines, herbs, non-prescription drugs, or dietary supplements you use. Also tell them if you smoke, drink alcohol, or use illegal drugs. Some items may interact with your medicine. What should I watch for while using this medication? Your condition will be monitored carefully while you are receiving this medication. You may need blood work while taking this medication. This medication may cause serious skin reactions. They can happen weeks to months after starting the medication. Contact your care team right away if you notice fevers or flu-like symptoms with a rash. The rash may be red or purple and then turn into blisters or peeling of the skin. You may also notice a red rash with swelling of the face, lips, or lymph nodes in your neck or under your arms. Tell your care team right away if you have any change in your eyesight. Talk to your care team if you may be pregnant. Serious birth defects can occur if you take this medication during pregnancy and for 3 months after the last dose. You will need a negative pregnancy test before starting this medication. Contraception is  recommended while taking this medication and for 3 months after the last dose. Your care team can help you find the option that works for you. Do not breastfeed while taking this medication and for 3 months after the last dose. What side effects may I notice from receiving this medication? Side effects that you should report to your care team as soon as possible: Allergic reactions--skin rash, itching, hives, swelling of the face, lips, tongue, or throat Dry cough, shortness of breath or trouble breathing Eye pain, redness, irritation, or discharge with blurry or decreased vision Heart muscle inflammation--unusual weakness or fatigue, shortness of breath, chest pain, fast or irregular heartbeat, dizziness, swelling of the ankles, feet, or hands Hormone gland problems--headache, sensitivity to light, unusual weakness or fatigue, dizziness, fast or irregular heartbeat, increased sensitivity to cold or heat, excessive sweating, constipation, hair loss, increased thirst or amount of urine, tremors or shaking, irritability Infusion reactions--chest pain, shortness of breath or trouble breathing, feeling faint or lightheaded Kidney injury (glomerulonephritis)--decrease in the amount of urine, red or dark brown urine, foamy or bubbly urine, swelling of the ankles, hands, or feet Liver injury--right upper belly pain, loss of appetite, nausea, light-colored stool, dark yellow or brown urine, yellowing skin or eyes, unusual weakness or fatigue Pain, tingling, or numbness  in the hands or feet, muscle weakness, change in vision, confusion or trouble speaking, loss of balance or coordination, trouble walking, seizures Rash, fever, and swollen lymph nodes Redness, blistering, peeling, or loosening of the skin, including inside the mouth Sudden or severe stomach pain, bloody diarrhea, fever, nausea, vomiting Side effects that usually do not require medical attention (report these to your care team if they continue  or are bothersome): Bone, joint, or muscle pain Diarrhea Fatigue Loss of appetite Nausea Skin rash This list may not describe all possible side effects. Call your doctor for medical advice about side effects. You may report side effects to FDA at 1-800-FDA-1088. Where should I keep my medication? This medication is given in a hospital or clinic. It will not be stored at home. NOTE: This sheet is a summary. It may not cover all possible information. If you have questions about this medicine, talk to your doctor, pharmacist, or health care provider.  2024 Elsevier/Gold Standard (2021-06-27 00:00:00)

## 2022-07-27 ENCOUNTER — Telehealth: Payer: Self-pay

## 2022-07-27 NOTE — Telephone Encounter (Signed)
Jennifer [name] states that she is doing fine. She is eating, drinking, and urinating well. She knows to call the office at [phone] if she has any questions or concerns. 

## 2022-07-27 NOTE — Telephone Encounter (Signed)
-----   Message from Pauletta Browns, RN sent at 07/26/2022  3:33 PM EDT ----- Regarding: Arbutus Ped- First time follow up Please follow up with patient. Received durvalumab for first time 07/26/22.

## 2022-07-28 LAB — T4: T4, Total: 8.6 ug/dL (ref 4.5–12.0)

## 2022-08-17 ENCOUNTER — Other Ambulatory Visit: Payer: Self-pay

## 2022-08-20 ENCOUNTER — Other Ambulatory Visit: Payer: Self-pay

## 2022-08-20 ENCOUNTER — Ambulatory Visit
Admission: RE | Admit: 2022-08-20 | Discharge: 2022-08-20 | Disposition: A | Discharge status: Home / Self Care | Payer: Medicare Other | Source: Ambulatory Visit | Attending: Internal Medicine | Admitting: Internal Medicine

## 2022-08-20 DIAGNOSIS — C3412 Malignant neoplasm of upper lobe, left bronchus or lung: Secondary | ICD-10-CM | POA: Insufficient documentation

## 2022-08-20 DIAGNOSIS — Z902 Acquired absence of lung [part of]: Secondary | ICD-10-CM | POA: Insufficient documentation

## 2022-08-20 DIAGNOSIS — Z510 Encounter for antineoplastic radiation therapy: Secondary | ICD-10-CM | POA: Insufficient documentation

## 2022-08-20 DIAGNOSIS — Z5111 Encounter for antineoplastic chemotherapy: Secondary | ICD-10-CM | POA: Insufficient documentation

## 2022-08-20 DIAGNOSIS — I10 Essential (primary) hypertension: Secondary | ICD-10-CM | POA: Insufficient documentation

## 2022-08-20 DIAGNOSIS — Z923 Personal history of irradiation: Secondary | ICD-10-CM | POA: Insufficient documentation

## 2022-08-20 NOTE — Progress Notes (Signed)
  Radiation Oncology         [phone]) [phone] ________________________________  Name: Jennifer [name] MRN: [id number]  Date of Service: 08/20/2022  DOB: 1946-02-26  Post Treatment Telephone Note  Diagnosis:  Recurrent Stage IB, cT2aN0M0, NSCLC, of the left upper lobe with disease in the mediastinum (as documented in provider EOT note)  The patient was not available for call today. Voicemail left.  The patient has scheduled follow up with her medical oncologist Dr. Arbutus Ped for ongoing care, and was encouraged to call if she develops concerns or questions regarding radiation.    Ruel Favors, LPN

## 2022-08-22 ENCOUNTER — Other Ambulatory Visit: Payer: Self-pay

## 2022-08-23 ENCOUNTER — Inpatient Hospital Stay: Payer: Medicare Other

## 2022-08-23 ENCOUNTER — Other Ambulatory Visit: Payer: Self-pay

## 2022-08-23 ENCOUNTER — Inpatient Hospital Stay: Payer: Medicare Other | Attending: Internal Medicine

## 2022-08-23 ENCOUNTER — Inpatient Hospital Stay (HOSPITAL_BASED_OUTPATIENT_CLINIC_OR_DEPARTMENT_OTHER): Payer: Medicare Other | Admitting: Internal Medicine

## 2022-08-23 VITALS — BP 123/74 | HR 71

## 2022-08-23 DIAGNOSIS — Z923 Personal history of irradiation: Secondary | ICD-10-CM | POA: Insufficient documentation

## 2022-08-23 DIAGNOSIS — C3490 Malignant neoplasm of unspecified part of unspecified bronchus or lung: Secondary | ICD-10-CM | POA: Diagnosis not present

## 2022-08-23 DIAGNOSIS — Z902 Acquired absence of lung [part of]: Secondary | ICD-10-CM | POA: Diagnosis not present

## 2022-08-23 DIAGNOSIS — L659 Nonscarring hair loss, unspecified: Secondary | ICD-10-CM | POA: Diagnosis not present

## 2022-08-23 DIAGNOSIS — C3412 Malignant neoplasm of upper lobe, left bronchus or lung: Secondary | ICD-10-CM | POA: Diagnosis not present

## 2022-08-23 DIAGNOSIS — Z5112 Encounter for antineoplastic immunotherapy: Secondary | ICD-10-CM | POA: Insufficient documentation

## 2022-08-23 DIAGNOSIS — C771 Secondary and unspecified malignant neoplasm of intrathoracic lymph nodes: Secondary | ICD-10-CM | POA: Insufficient documentation

## 2022-08-23 DIAGNOSIS — D649 Anemia, unspecified: Secondary | ICD-10-CM | POA: Insufficient documentation

## 2022-08-23 LAB — CMP (CANCER CENTER ONLY)
ALT: 12 U/L (ref 0–44)
AST: 14 U/L — ABNORMAL LOW (ref 15–41)
Albumin: 3.9 g/dL (ref 3.5–5.0)
Alkaline Phosphatase: 113 U/L (ref 38–126)
Anion gap: 12 (ref 5–15)
BUN: 18 mg/dL (ref 8–23)
CO2: 26 mmol/L (ref 22–32)
Calcium: 9.8 mg/dL (ref 8.9–10.3)
Chloride: 100 mmol/L (ref 98–111)
Creatinine: 1.17 mg/dL — ABNORMAL HIGH (ref 0.44–1.00)
GFR, Estimated: 49 mL/min — ABNORMAL LOW (ref >60)
Glucose, Bld: 128 mg/dL — ABNORMAL HIGH (ref 70–99)
Potassium: 3.5 mmol/L (ref 3.5–5.1)
Sodium: 138 mmol/L (ref 135–145)
Total Bilirubin: 0.5 mg/dL (ref 0.3–1.2)
Total Protein: 7.3 g/dL (ref 6.5–8.1)

## 2022-08-23 LAB — CBC WITH DIFFERENTIAL (CANCER CENTER ONLY)
Abs Immature Granulocytes: 0.02 10*3/uL (ref 0.00–0.07)
Basophils Absolute: 0 10*3/uL (ref 0.0–0.1)
Basophils Relative: 0 %
Eosinophils Absolute: 0.2 10*3/uL (ref 0.0–0.5)
Eosinophils Relative: 3 %
HCT: 31 % — ABNORMAL LOW (ref 36.0–46.0)
Hemoglobin: 10 g/dL — ABNORMAL LOW (ref 12.0–15.0)
Immature Granulocytes: 0 %
Lymphocytes Relative: 15 %
Lymphs Abs: 1.1 10*3/uL (ref 0.7–4.0)
MCH: 31.3 pg (ref 26.0–34.0)
MCHC: 32.3 g/dL (ref 30.0–36.0)
MCV: 96.9 fL (ref 80.0–100.0)
Monocytes Absolute: 0.5 10*3/uL (ref 0.1–1.0)
Monocytes Relative: 7 %
Neutro Abs: 5.2 10*3/uL (ref 1.7–7.7)
Neutrophils Relative %: 75 %
Platelet Count: 230 10*3/uL (ref 150–400)
RBC: 3.2 MIL/uL — ABNORMAL LOW (ref 3.87–5.11)
RDW: 13.2 % (ref 11.5–15.5)
WBC Count: 7 10*3/uL (ref 4.0–10.5)
nRBC: 0 % (ref 0.0–0.2)

## 2022-08-23 MED ORDER — SODIUM CHLORIDE 0.9 % IV SOLN: Freq: Once | INTRAVENOUS | Status: AC

## 2022-08-23 MED ORDER — SODIUM CHLORIDE 0.9 % IV SOLN
1500.0000 mg | Freq: Once | INTRAVENOUS | Status: AC
  Administered 2022-08-23: 1500 mg via INTRAVENOUS
  Filled 2022-08-23: qty 30

## 2022-08-23 MED ORDER — SODIUM CHLORIDE 0.9% FLUSH: 10.0000 mL | INTRAVENOUS | Status: DC | PRN

## 2022-08-23 MED ORDER — HEPARIN SOD (PORK) LOCK FLUSH 100 UNIT/ML IV SOLN: 500.0000 [IU] | Freq: Once | INTRAVENOUS | Status: DC | PRN

## 2022-08-23 NOTE — Patient Instructions (Signed)
[facility]  Discharge Instructions: Thank you for choosing [facility] to provide your oncology and hematology care.   If you have a lab appointment with the Cancer Center, please go directly to the Cancer Center and check in at the registration area.   Wear comfortable clothing and clothing appropriate for easy access to any Portacath or PICC line.   We strive to give you quality time with your provider. You may need to reschedule your appointment if you arrive late (15 or more minutes).  Arriving late affects you and other patients whose appointments are after yours.  Also, if you miss three or more appointments without notifying the office, you may be dismissed from the clinic at the provider's discretion.      For prescription refill requests, have your pharmacy contact our office and allow 72 hours for refills to be completed.    Today you received the following chemotherapy and/or immunotherapy agents: Imfinzi      To help prevent nausea and vomiting after your treatment, we encourage you to take your nausea medication as directed.  BELOW ARE SYMPTOMS THAT SHOULD BE REPORTED IMMEDIATELY: *FEVER GREATER THAN 100.4 F (38 C) OR HIGHER *CHILLS OR SWEATING *NAUSEA AND VOMITING THAT IS NOT CONTROLLED WITH YOUR NAUSEA MEDICATION *UNUSUAL SHORTNESS OF BREATH *UNUSUAL BRUISING OR BLEEDING *URINARY PROBLEMS (pain or burning when urinating, or frequent urination) *BOWEL PROBLEMS (unusual diarrhea, constipation, pain near the anus) TENDERNESS IN MOUTH AND THROAT WITH OR WITHOUT PRESENCE OF ULCERS (sore throat, sores in mouth, or a toothache) UNUSUAL RASH, SWELLING OR PAIN  UNUSUAL VAGINAL DISCHARGE OR ITCHING   Items with * indicate a potential emergency and should be followed up as soon as possible or go to the Emergency Department if any problems should occur.  Please show the CHEMOTHERAPY ALERT CARD or IMMUNOTHERAPY ALERT CARD at  check-in to the Emergency Department and triage nurse.  Should you have questions after your visit or need to cancel or reschedule your appointment, please contact [facility]  Dept: [phone]  and follow the prompts.  Office hours are 8:00 a.m. to 4:30 p.m. Monday - Friday. Please note that voicemails left after 4:00 p.m. may not be returned until the following business day.  We are closed weekends and major holidays. You have access to a nurse at all times for urgent questions. Please call the main number to the clinic Dept: [phone] and follow the prompts.   For any non-urgent questions, you may also contact your provider using MyChart. We now offer e-Visits for anyone 18 and older to request care online for non-urgent symptoms. For details visit [email or web address].   Also download the MyChart app! Go to the app store, search "MyChart", open the app, select [facility], and log in with your MyChart username and password.   

## 2022-08-23 NOTE — Progress Notes (Signed)
[facility] [phone]   [phone]  OFFICE PROGRESS NOTE  Felix Pacini, FNP [address] [place] [address]  DIAGNOSIS: Recurrent non-small cell lung cancer that was initially diagnosed as stage IB (T2 a, N0, M0) non-small cell lung cancer, adenocarcinoma diagnosed in September 2020. She is status post left upper lobectomy with lymph node dissection with tumor size of 3.2 cm and negative lymphadenopathy at that time. She had disease recurrence in July 2022 with anterior mediastinal lymphadenopathy which was consistent with metastatic non-small cell lung cancer.    Biomarker Findings Tumor Mutational Burden - 21 Muts/Mb Microsatellite status - MS-Stable Genomic Findings For a complete list of the genes assayed, please refer to the Appendix. KRAS G12V TP53 F134L 7 Disease relevant genes with no reportable alterations: ALK, BRAF, EGFR, ERBB2, MET, RET, ROS1   PDL1: 0%   PRIOR THERAPY: 1)  Left upper lobectomy under the care of Dr. Cliffton Asters in November 2020. 2) SBRT to the disease recurrence in the anterior mediastinal lymphadenopathy under the care of Dr. Mitzi Hansen. Last treatment 12/16/20. 3) Concurrent chemoradiation with carboplatin for an AUC of 2 and Taxol 45 mg/m2. First dose expected on 05/08/2022.  Status post 7 cycles with partial response.  CURRENT THERAPY: Consolidation treatment with immunotherapy with Imfinzi 1500 Mg IV every 4 weeks.  First dose Jul 26, 2022.  Status post 1 cycle.  INTERVAL HISTORY: Jennifer [name] 77 y.o. female returns to the clinic today for follow-up visit.  The patient tolerated the first cycle of her treatment with immunotherapy fairly well except for some congestion.  She denied having any chest pain, shortness of breath, cough or hemoptysis.  She has no nausea, vomiting, diarrhea or constipation.  She denied having any headache or visual changes.  She has no fever or chills.  She is here today for  evaluation before starting cycle #2.  MEDICAL HISTORY: Past Medical History:  Diagnosis Date   Cancer (HCC)    Lung cancer   History of chicken pox    Hyperlipidemia    Hypertension    Menopause    Osteoporosis    Pneumonia    "walking" pneumonia   Pre-diabetes    Vitamin D deficiency     ALLERGIES:  is allergic to aspirin and latex.  MEDICATIONS:  Current Outpatient Medications  Medication Sig Dispense Refill   sucralfate (CARAFATE) 1 g tablet Take 1 tablet (1 g total) by mouth 4 (four) times daily. Dissolve each tablet in 15 cc water before use. (Patient not taking: Reported on 06/18/2022) 120 tablet 2   atorvastatin (LIPITOR) 40 MG tablet Take 40 mg by mouth every evening.      diltiazem (DILACOR XR) 240 MG 24 hr capsule Take 240 mg by mouth daily.     guaiFENesin (MUCINEX) 600 MG 12 hr tablet Take 1 tablet (600 mg total) by mouth 2 (two) times daily. (Patient taking differently: Take 600 mg by mouth daily.)     metoprolol succinate (TOPROL-XL) 50 MG 24 hr tablet Take 50 mg by mouth daily.     Multiple Vitamin (MULTIVITAMIN WITH MINERALS) TABS tablet Take 1 tablet by mouth daily.     prochlorperazine (COMPAZINE) 10 MG tablet Take 1 tablet (10 mg total) by mouth every 6 (six) hours as needed. 30 tablet 2   triamterene-hydrochlorothiazide (MAXZIDE-25) 37.5-25 MG tablet Take 1 tablet by mouth daily.     No current facility-administered medications for this visit.    SURGICAL HISTORY:  Past Surgical History:  Procedure Laterality Date   CHEST TUBE INSERTION Left 01/19/2019   Procedure: Chest Tube Insertion;  Surgeon: Corliss Skains, MD;  [facility] OR;  Service: Thoracic;  Laterality: Left;   PLEURADESIS N/A 01/06/2019   Procedure: Pleuradesis - Chemical;  Surgeon: Corliss Skains, MD;  Location: [facility] OR;  Service: Thoracic;  Laterality: N/A;   PLEURADESIS Left 01/19/2019   Procedure: Mechanical Pleuradesis;  Surgeon: Corliss Skains, MD;  Location: [facility] OR;   Service: Thoracic;  Laterality: Left;   VIDEO ASSISTED THORACOSCOPY Left 01/19/2019   Procedure: VIDEO ASSISTED THORACOSCOPY;  Surgeon: Corliss Skains, MD;  Location: [facility] OR;  Service: Thoracic;  Laterality: Left;   VIDEO ASSISTED THORACOSCOPY (VATS)/ LOBECTOMY Left 12/29/2018   Procedure: VIDEO ASSISTED THORACOSCOPY (VATS)/LEFT UPPER  LOBECTOMY with Mediastinal lymph node exploration.;  Surgeon: Corliss Skains, MD;  Location: [facility] OR;  Service: Thoracic;  Laterality: Left;   VIDEO BRONCHOSCOPY N/A 12/29/2018   Procedure: VIDEO BRONCHOSCOPY;  Surgeon: Corliss Skains, MD;  Location: [facility] OR;  Service: Thoracic;  Laterality: N/A;   VIDEO BRONCHOSCOPY WITH ENDOBRONCHIAL NAVIGATION N/A 12/09/2018   Procedure: VIDEO BRONCHOSCOPY WITH ENDOBRONCHIAL NAVIGATION;  Surgeon: Corliss Skains, MD;  Location: [facility] OR;  Service: Thoracic;  Laterality: N/A;   VIDEO BRONCHOSCOPY WITH INSERTION OF INTERBRONCHIAL VALVE (IBV) N/A 01/06/2019   Procedure: VIDEO BRONCHOSCOPY WITH INSERTION OF INTERBRONCHIAL VALVE (IBV);  Surgeon: Corliss Skains, MD;  Location: [facility] OR;  Service: Thoracic;  Laterality: N/A;   VIDEO BRONCHOSCOPY WITH INSERTION OF INTERBRONCHIAL VALVE (IBV) N/A 01/13/2019   Procedure: VIDEO BRONCHOSCOPY WITH INSERTION OF INTERBRONCHIAL VALVE (IBV);  Surgeon: Corliss Skains, MD;  Location: [facility] OR;  Service: Thoracic;  Laterality: N/A;   VIDEO BRONCHOSCOPY WITH INSERTION OF INTERBRONCHIAL VALVE (IBV) N/A 02/23/2019   Procedure: VIDEO BRONCHOSCOPY WITH REMOVAL OF INTERBRONCHIAL VALVE (IBV);  Surgeon: Corliss Skains, MD;  Location: [facility] OR;  Service: Thoracic;  Laterality: N/A;    REVIEW OF SYSTEMS:  A comprehensive review of systems was negative except for: Ears, nose, mouth, throat, and face: positive for epistaxis   PHYSICAL EXAMINATION: General appearance: alert, cooperative, fatigued, and no distress Head: Normocephalic, without obvious abnormality, atraumatic Neck: no  adenopathy, no JVD, supple, symmetrical, trachea midline, and thyroid not enlarged, symmetric, no tenderness/mass/nodules Lymph nodes: Cervical, supraclavicular, and axillary nodes normal. Resp: clear to auscultation bilaterally Back: symmetric, no curvature. ROM normal. No CVA tenderness. Cardio: regular rate and rhythm, S1, S2 normal, no murmur, click, rub or gallop GI: soft, non-tender; bowel sounds normal; no masses,  no organomegaly Extremities: extremities normal, atraumatic, no cyanosis or edema  ECOG PERFORMANCE STATUS: 1 - Symptomatic but completely ambulatory  Blood pressure 122/66, pulse 88, temperature 97.8 F (36.6 C), temperature source Oral, resp. rate 18, height 5\' 9"  (1.753 m), weight 161 lb 11.2 oz (73.3 kg), SpO2 100 %.  LABORATORY DATA: Lab Results  Component Value Date   WBC 7.0 08/23/2022   HGB 10.0 (L) 08/23/2022   HCT 31.0 (L) 08/23/2022   MCV 96.9 08/23/2022   PLT 230 08/23/2022      Chemistry      Component Value Date/Time   NA 138 07/26/2022 1400   K 3.6 07/26/2022 1400   CL 100 07/26/2022 1400   CO2 26 07/26/2022 1400   BUN 23 07/26/2022 1400   CREATININE 1.44 (H) 07/26/2022 1400   GLU 845 10/28/2009 0000      Component Value Date/Time   CALCIUM 9.4 07/26/2022 1400  ALKPHOS 94 07/26/2022 1400   AST 18 07/26/2022 1400   ALT 13 07/26/2022 1400   BILITOT 0.7 07/26/2022 1400       RADIOGRAPHIC STUDIES: No results found.  ASSESSMENT AND PLAN: This is a very pleasant 77 years old white female with recurrent non-small cell lung cancer that was initially diagnosed as stage Ib (T2 a, N0, M0) adenocarcinoma diagnosed in September 2020 status post left upper lobectomy with lymph node dissection with disease recurrence in July 2022 presenting with anterior mediastinal lymphadenopathy.  The patient is status post curative radiotherapy to this area under the care of Dr. Mitzi Hansen completed December 16, 2020.   She was found to have another evidence of disease  recurrence in the AP window lymph nodes in February 2024.  She has brain MRI that showed no evidence of metastatic disease to the brain. She underwent concurrent chemoradiation with weekly carboplatin for AUC of 2 and paclitaxel 45 Mg/M2.  First dose May 08, 2022.  Status post 7 cycles.  Last dose was given on 06/18/2022 with partial response. The patient tolerated her previous treatment fairly well except for fatigue and odynophagia. She also has mild alopecia. She is currently undergoing consolidation treatment with immunotherapy with Imfinzi 1500 Mg IV every 4 weeks.  She started the first dose on Jul 26, 2022.  Status post 1 cycle.  She tolerated the first cycle of her treatment well with no concerning adverse effects but continues to have mild fatigue. I recommended for the patient to proceed with cycle #2 today as planned. For the anemia she was advised to take over-the-counter multivitamins with iron tablets. I will see her back for follow-up visit in 4 weeks for evaluation before the next cycle of her treatment. She was advised to call immediately if she has any other concerning symptoms in the interval. The patient voices understanding of current disease status and treatment options and is in agreement with the current care plan.  All questions were answered. The patient knows to call the clinic with any problems, questions or concerns. We can certainly see the patient much sooner if necessary.  The total time spent in the appointment was 20 minutes.  Disclaimer: This note was dictated with voice recognition software. Similar sounding words can inadvertently be transcribed and may not be corrected upon review.

## 2022-09-20 ENCOUNTER — Inpatient Hospital Stay (HOSPITAL_BASED_OUTPATIENT_CLINIC_OR_DEPARTMENT_OTHER): Payer: Medicare Other | Admitting: Internal Medicine

## 2022-09-20 ENCOUNTER — Inpatient Hospital Stay: Payer: Medicare Other | Attending: Internal Medicine

## 2022-09-20 ENCOUNTER — Inpatient Hospital Stay: Payer: Medicare Other

## 2022-09-20 ENCOUNTER — Other Ambulatory Visit: Payer: Self-pay

## 2022-09-20 VITALS — BP 125/79 | HR 91 | Temp 98.5°F | Resp 17 | Ht 69.0 in | Wt 160.8 lb

## 2022-09-20 DIAGNOSIS — C3412 Malignant neoplasm of upper lobe, left bronchus or lung: Secondary | ICD-10-CM | POA: Diagnosis not present

## 2022-09-20 DIAGNOSIS — Z79620 Long term (current) use of immunosuppressive biologic: Secondary | ICD-10-CM | POA: Diagnosis not present

## 2022-09-20 DIAGNOSIS — Z902 Acquired absence of lung [part of]: Secondary | ICD-10-CM | POA: Insufficient documentation

## 2022-09-20 DIAGNOSIS — C3490 Malignant neoplasm of unspecified part of unspecified bronchus or lung: Secondary | ICD-10-CM

## 2022-09-20 DIAGNOSIS — Z5112 Encounter for antineoplastic immunotherapy: Secondary | ICD-10-CM | POA: Insufficient documentation

## 2022-09-20 LAB — CBC WITH DIFFERENTIAL (CANCER CENTER ONLY)
Abs Immature Granulocytes: 0.01 10*3/uL (ref 0.00–0.07)
Basophils Absolute: 0 10*3/uL (ref 0.0–0.1)
Basophils Relative: 0 %
Eosinophils Absolute: 0.2 10*3/uL (ref 0.0–0.5)
Eosinophils Relative: 3 %
HCT: 35.3 % — ABNORMAL LOW (ref 36.0–46.0)
Hemoglobin: 11.5 g/dL — ABNORMAL LOW (ref 12.0–15.0)
Immature Granulocytes: 0 %
Lymphocytes Relative: 21 %
Lymphs Abs: 1.5 10*3/uL (ref 0.7–4.0)
MCH: 30.3 pg (ref 26.0–34.0)
MCHC: 32.6 g/dL (ref 30.0–36.0)
MCV: 93.1 fL (ref 80.0–100.0)
Monocytes Absolute: 0.6 10*3/uL (ref 0.1–1.0)
Monocytes Relative: 8 %
Neutro Abs: 4.6 10*3/uL (ref 1.7–7.7)
Neutrophils Relative %: 68 %
Platelet Count: 188 10*3/uL (ref 150–400)
RBC: 3.79 MIL/uL — ABNORMAL LOW (ref 3.87–5.11)
RDW: 13.8 % (ref 11.5–15.5)
WBC Count: 6.8 10*3/uL (ref 4.0–10.5)
nRBC: 0 % (ref 0.0–0.2)

## 2022-09-20 LAB — CMP (CANCER CENTER ONLY)
ALT: 15 U/L (ref 0–44)
AST: 19 U/L (ref 15–41)
Albumin: 4.3 g/dL (ref 3.5–5.0)
Alkaline Phosphatase: 114 U/L (ref 38–126)
Anion gap: 10 (ref 5–15)
BUN: 18 mg/dL (ref 8–23)
CO2: 29 mmol/L (ref 22–32)
Calcium: 9.8 mg/dL (ref 8.9–10.3)
Chloride: 100 mmol/L (ref 98–111)
Creatinine: 1.28 mg/dL — ABNORMAL HIGH (ref 0.44–1.00)
GFR, Estimated: 44 mL/min — ABNORMAL LOW (ref >60)
Glucose, Bld: 107 mg/dL — ABNORMAL HIGH (ref 70–99)
Potassium: 3.9 mmol/L (ref 3.5–5.1)
Sodium: 139 mmol/L (ref 135–145)
Total Bilirubin: 0.4 mg/dL (ref 0.3–1.2)
Total Protein: 7 g/dL (ref 6.5–8.1)

## 2022-09-20 LAB — TSH: TSH: 0.032 u[IU]/mL — ABNORMAL LOW (ref 0.350–4.500)

## 2022-09-20 MED ORDER — SODIUM CHLORIDE 0.9 % IV SOLN
1500.0000 mg | Freq: Once | INTRAVENOUS | Status: AC
  Administered 2022-09-20: 1500 mg via INTRAVENOUS
  Filled 2022-09-20: qty 30

## 2022-09-20 MED ORDER — SODIUM CHLORIDE 0.9 % IV SOLN: Freq: Once | INTRAVENOUS | Status: AC

## 2022-09-20 NOTE — Progress Notes (Signed)
[facility] Telephone:([phone]   Fax:(336) [phone]  OFFICE PROGRESS NOTE  Jennifer Pacini, FNP [address] [place] [address]  DIAGNOSIS: Recurrent non-small cell lung cancer that was initially diagnosed as stage IB (T2 a, N0, M0) non-small cell lung cancer, adenocarcinoma diagnosed in September 2020. She is status post left upper lobectomy with lymph node dissection with tumor size of 3.2 cm and negative lymphadenopathy at that time. She had disease recurrence in July 2022 with anterior mediastinal lymphadenopathy which was consistent with metastatic non-small cell lung cancer.    Biomarker Findings Tumor Mutational Burden - 21 Muts/Mb Microsatellite status - MS-Stable Genomic Findings For a complete list of the genes assayed, please refer to the Appendix. KRAS G12V TP53 F134L 7 Disease relevant genes with no reportable alterations: ALK, BRAF, EGFR, ERBB2, MET, RET, ROS1   PDL1: 0%   PRIOR THERAPY: 1)  Left upper lobectomy under the care of Dr. Cliffton Asters in November 2020. 2) SBRT to the disease recurrence in the anterior mediastinal lymphadenopathy under the care of Dr. Mitzi Hansen. Last treatment 12/16/20. 3) Concurrent chemoradiation with carboplatin for an AUC of 2 and Taxol 45 mg/m2. First dose expected on 05/08/2022.  Status post 7 cycles with partial response.  CURRENT THERAPY: Consolidation treatment with immunotherapy with Imfinzi 1500 Mg IV every 4 weeks.  First dose Jul 26, 2022.  Status post 2 cycles .  INTERVAL HISTORY: Jennifer [name] 77 y.o. female returns to the clinic today for follow-up visit.  The patient is feeling fine today with no concerning complaints.  She is feeling much better today and tolerating her treatment well.  She denied having any current chest pain, shortness of breath, cough or hemoptysis.  She has no nausea, vomiting, diarrhea or constipation.  She has no headache or visual changes.  She denied having any recent weight  loss or night sweats.  She is celebrating her 28 birthday tomorrow.  The patient is here today for evaluation before starting cycle #3 of her treatment.  MEDICAL HISTORY: Past Medical History:  Diagnosis Date   Cancer (HCC)    Lung cancer   History of chicken pox    Hyperlipidemia    Hypertension    Menopause    Osteoporosis    Pneumonia    "walking" pneumonia   Pre-diabetes    Vitamin D deficiency     ALLERGIES:  is allergic to aspirin and latex.  MEDICATIONS:  Current Outpatient Medications  Medication Sig Dispense Refill   atorvastatin (LIPITOR) 40 MG tablet Take 40 mg by mouth every evening.      diltiazem (DILACOR XR) 240 MG 24 hr capsule Take 240 mg by mouth daily.     guaiFENesin (MUCINEX) 600 MG 12 hr tablet Take 1 tablet (600 mg total) by mouth 2 (two) times daily. (Patient taking differently: Take 600 mg by mouth daily.)     metoprolol succinate (TOPROL-XL) 50 MG 24 hr tablet Take 50 mg by mouth daily.     Multiple Vitamin (MULTIVITAMIN WITH MINERALS) TABS tablet Take 1 tablet by mouth daily.     triamterene-hydrochlorothiazide (MAXZIDE-25) 37.5-25 MG tablet Take 1 tablet by mouth daily.     No current facility-administered medications for this visit.    SURGICAL HISTORY:  Past Surgical History:  Procedure Laterality Date   CHEST TUBE INSERTION Left 01/19/2019   Procedure: Chest Tube Insertion;  Surgeon: Corliss Skains, MD;  Location: [facility] OR;  Service: Thoracic;  Laterality: Left;  PLEURADESIS N/A 01/06/2019   Procedure: Pleuradesis - Chemical;  Surgeon: Corliss Skains, MD;  Location: [facility] OR;  Service: Thoracic;  Laterality: N/A;   PLEURADESIS Left 01/19/2019   Procedure: Mechanical Pleuradesis;  Surgeon: Corliss Skains, MD;  Location: [facility] OR;  Service: Thoracic;  Laterality: Left;   VIDEO ASSISTED THORACOSCOPY Left 01/19/2019   Procedure: VIDEO ASSISTED THORACOSCOPY;  Surgeon: Corliss Skains, MD;  Location: [facility] OR;  Service: Thoracic;   Laterality: Left;   VIDEO ASSISTED THORACOSCOPY (VATS)/ LOBECTOMY Left 12/29/2018   Procedure: VIDEO ASSISTED THORACOSCOPY (VATS)/LEFT UPPER  LOBECTOMY with Mediastinal lymph node exploration.;  Surgeon: Corliss Skains, MD;  Location: [facility] OR;  Service: Thoracic;  Laterality: Left;   VIDEO BRONCHOSCOPY N/A 12/29/2018   Procedure: VIDEO BRONCHOSCOPY;  Surgeon: Corliss Skains, MD;  Location: [facility] OR;  Service: Thoracic;  Laterality: N/A;   VIDEO BRONCHOSCOPY WITH ENDOBRONCHIAL NAVIGATION N/A 12/09/2018   Procedure: VIDEO BRONCHOSCOPY WITH ENDOBRONCHIAL NAVIGATION;  Surgeon: Corliss Skains, MD;  Location: [facility] OR;  Service: Thoracic;  Laterality: N/A;   VIDEO BRONCHOSCOPY WITH INSERTION OF INTERBRONCHIAL VALVE (IBV) N/A 01/06/2019   Procedure: VIDEO BRONCHOSCOPY WITH INSERTION OF INTERBRONCHIAL VALVE (IBV);  Surgeon: Corliss Skains, MD;  Location[facility] OR;  Service: Thoracic;  Laterality: N/A;   VIDEO BRONCHOSCOPY WITH INSERTION OF INTERBRONCHIAL VALVE (IBV) N/A 01/13/2019   Procedure: VIDEO BRONCHOSCOPY WITH INSERTION OF INTERBRONCHIAL VALVE (IBV);  Surgeon: Corliss Skains, MD;  Location: [facility] OR;  Service: Thoracic;  Laterality: N/A;   VIDEO BRONCHOSCOPY WITH INSERTION OF INTERBRONCHIAL VALVE (IBV) N/A 02/23/2019   Procedure: VIDEO BRONCHOSCOPY WITH REMOVAL OF INTERBRONCHIAL VALVE (IBV);  Surgeon: Corliss Skains, MD;  Location: [facility] OR;  Service: Thoracic;  Laterality: N/A;    REVIEW OF SYSTEMS:  A comprehensive review of systems was negative.   PHYSICAL EXAMINATION: General appearance: alert, cooperative, and no distress Head: Normocephalic, without obvious abnormality, atraumatic Neck: no adenopathy, no JVD, supple, symmetrical, trachea midline, and thyroid not enlarged, symmetric, no tenderness/mass/nodules Lymph nodes: Cervical, supraclavicular, and axillary nodes normal. Resp: clear to auscultation bilaterally Back: symmetric, no curvature. ROM normal. No CVA  tenderness. Cardio: regular rate and rhythm, S1, S2 normal, no murmur, click, rub or gallop GI: soft, non-tender; bowel sounds normal; no masses,  no organomegaly Extremities: extremities normal, atraumatic, no cyanosis or edema  ECOG PERFORMANCE STATUS: 1 - Symptomatic but completely ambulatory  Blood pressure 125/79, pulse 91, temperature 98.5 F (36.9 C), temperature source Oral, resp. rate 17, height 5\' 9"  (1.753 m), weight 160 lb 12.8 oz (72.9 kg), SpO2 100%.  LABORATORY DATA: Lab Results  Component Value Date   WBC 6.8 09/20/2022   HGB 11.5 (L) 09/20/2022   HCT 35.3 (L) 09/20/2022   MCV 93.1 09/20/2022   PLT 188 09/20/2022      Chemistry      Component Value Date/Time   NA 139 09/20/2022 1030   K 3.9 09/20/2022 1030   CL 100 09/20/2022 1030   CO2 29 09/20/2022 1030   BUN 18 09/20/2022 1030   CREATININE 1.28 (H) 09/20/2022 1030   GLU 845 10/28/2009 0000      Component Value Date/Time   CALCIUM 9.8 09/20/2022 1030   ALKPHOS 114 09/20/2022 1030   AST 19 09/20/2022 1030   ALT 15 09/20/2022 1030   BILITOT 0.4 09/20/2022 1030       RADIOGRAPHIC STUDIES: No results found.  ASSESSMENT AND PLAN: This is a very pleasant 77 years old white female with recurrent  non-small cell lung cancer that was initially diagnosed as stage Ib (T2 a, N0, M0) adenocarcinoma diagnosed in September 2020 status post left upper lobectomy with lymph node dissection with disease recurrence in July 2022 presenting with anterior mediastinal lymphadenopathy.  The patient is status post curative radiotherapy to this area under the care of Dr. Mitzi Hansen completed December 16, 2020.   She was found to have another evidence of disease recurrence in the AP window lymph nodes in February 2024.  She has brain MRI that showed no evidence of metastatic disease to the brain. She underwent concurrent chemoradiation with weekly carboplatin for AUC of 2 and paclitaxel 45 Mg/M2.  First dose May 08, 2022.  Status post  7 cycles.  Last dose was given on 06/18/2022 with partial response. The patient tolerated her previous treatment fairly well except for fatigue and odynophagia. She also has mild alopecia. She is currently undergoing consolidation treatment with immunotherapy with Imfinzi 1500 Mg IV every 4 weeks.  She started the first dose on Jul 26, 2022.  Status post 2 cycles.   The patient has been tolerating her treatment well with no concerning adverse effects. I recommended for her to proceed with cycle #3 today as planned. Will see her back for follow-up visit in 4 weeks for evaluation with repeat CT scan of the chest for restaging of her disease. The patient was advised to call immediately if she has any concerning symptoms in the interval. The patient voices understanding of current disease status and treatment options and is in agreement with the current care plan.  All questions were answered. The patient knows to call the clinic with any problems, questions or concerns. We can certainly see the patient much sooner if necessary.  The total time spent in the appointment was 20 minutes.  Disclaimer: This note was dictated with voice recognition software. Similar sounding words can inadvertently be transcribed and may not be corrected upon review.

## 2022-09-20 NOTE — Patient Instructions (Signed)

## 2022-09-25 ENCOUNTER — Other Ambulatory Visit: Payer: Self-pay

## 2022-10-11 ENCOUNTER — Other Ambulatory Visit: Payer: Self-pay

## 2022-10-12 ENCOUNTER — Ambulatory Visit (HOSPITAL_COMMUNITY)
Admission: RE | Admit: 2022-10-12 | Discharge: 2022-10-12 | Disposition: A | Discharge status: Home / Self Care | Payer: Medicare Other | Source: Ambulatory Visit | Attending: Internal Medicine | Admitting: Internal Medicine

## 2022-10-12 DIAGNOSIS — C3490 Malignant neoplasm of unspecified part of unspecified bronchus or lung: Secondary | ICD-10-CM | POA: Insufficient documentation

## 2022-10-12 MED ORDER — IOHEXOL 300 MG/ML  SOLN
60.0000 mL | Freq: Once | INTRAMUSCULAR | Status: AC | PRN
  Administered 2022-10-12: 60 mL via INTRAVENOUS

## 2022-10-12 MED ORDER — SODIUM CHLORIDE (PF) 0.9 % IJ SOLN
INTRAMUSCULAR | Status: AC
  Filled 2022-10-12: qty 50

## 2022-10-18 ENCOUNTER — Inpatient Hospital Stay: Payer: Medicare Other

## 2022-10-18 ENCOUNTER — Inpatient Hospital Stay: Payer: Medicare Other | Attending: Internal Medicine

## 2022-10-18 ENCOUNTER — Inpatient Hospital Stay (HOSPITAL_BASED_OUTPATIENT_CLINIC_OR_DEPARTMENT_OTHER): Payer: Medicare Other | Admitting: Internal Medicine

## 2022-10-18 DIAGNOSIS — Z902 Acquired absence of lung [part of]: Secondary | ICD-10-CM | POA: Diagnosis not present

## 2022-10-18 DIAGNOSIS — C3412 Malignant neoplasm of upper lobe, left bronchus or lung: Secondary | ICD-10-CM | POA: Diagnosis not present

## 2022-10-18 DIAGNOSIS — Z5112 Encounter for antineoplastic immunotherapy: Secondary | ICD-10-CM | POA: Diagnosis not present

## 2022-10-18 DIAGNOSIS — Z923 Personal history of irradiation: Secondary | ICD-10-CM | POA: Insufficient documentation

## 2022-10-18 DIAGNOSIS — C3490 Malignant neoplasm of unspecified part of unspecified bronchus or lung: Secondary | ICD-10-CM | POA: Diagnosis not present

## 2022-10-18 LAB — CBC WITH DIFFERENTIAL (CANCER CENTER ONLY)
Abs Immature Granulocytes: 0.04 10*3/uL (ref 0.00–0.07)
Basophils Absolute: 0 10*3/uL (ref 0.0–0.1)
Basophils Relative: 0 %
Eosinophils Absolute: 0.1 10*3/uL (ref 0.0–0.5)
Eosinophils Relative: 2 %
HCT: 35.8 % — ABNORMAL LOW (ref 36.0–46.0)
Hemoglobin: 12 g/dL (ref 12.0–15.0)
Immature Granulocytes: 1 %
Lymphocytes Relative: 15 %
Lymphs Abs: 1.1 10*3/uL (ref 0.7–4.0)
MCH: 30.1 pg (ref 26.0–34.0)
MCHC: 33.5 g/dL (ref 30.0–36.0)
MCV: 89.7 fL (ref 80.0–100.0)
Monocytes Absolute: 0.5 10*3/uL (ref 0.1–1.0)
Monocytes Relative: 7 %
Neutro Abs: 5.3 10*3/uL (ref 1.7–7.7)
Neutrophils Relative %: 75 %
Platelet Count: 183 10*3/uL (ref 150–400)
RBC: 3.99 MIL/uL (ref 3.87–5.11)
RDW: 13.9 % (ref 11.5–15.5)
WBC Count: 7 10*3/uL (ref 4.0–10.5)
nRBC: 0 % (ref 0.0–0.2)

## 2022-10-18 LAB — CMP (CANCER CENTER ONLY)
ALT: 21 U/L (ref 0–44)
AST: 21 U/L (ref 15–41)
Albumin: 4.2 g/dL (ref 3.5–5.0)
Alkaline Phosphatase: 109 U/L (ref 38–126)
Anion gap: 11 (ref 5–15)
BUN: 19 mg/dL (ref 8–23)
CO2: 25 mmol/L (ref 22–32)
Calcium: 9.7 mg/dL (ref 8.9–10.3)
Chloride: 101 mmol/L (ref 98–111)
Creatinine: 1.15 mg/dL — ABNORMAL HIGH (ref 0.44–1.00)
GFR, Estimated: 49 mL/min — ABNORMAL LOW (ref >60)
Glucose, Bld: 125 mg/dL — ABNORMAL HIGH (ref 70–99)
Potassium: 3.6 mmol/L (ref 3.5–5.1)
Sodium: 137 mmol/L (ref 135–145)
Total Bilirubin: 0.6 mg/dL (ref 0.3–1.2)
Total Protein: 7.3 g/dL (ref 6.5–8.1)

## 2022-10-18 MED ORDER — SODIUM CHLORIDE 0.9 % IV SOLN
1500.0000 mg | Freq: Once | INTRAVENOUS | Status: AC
  Administered 2022-10-18: 1500 mg via INTRAVENOUS
  Filled 2022-10-18: qty 30

## 2022-10-18 MED ORDER — SODIUM CHLORIDE 0.9 % IV SOLN: Freq: Once | INTRAVENOUS | Status: AC

## 2022-10-18 NOTE — Progress Notes (Signed)
[facility] Telephone:(336) [phone]   Fax:(336) [phone]  OFFICE PROGRESS NOTE  Felix Pacini, FNP [address] [place] [address]  DIAGNOSIS: Recurrent non-small cell lung cancer that was initially diagnosed as stage IB (T2 a, N0, M0) non-small cell lung cancer, adenocarcinoma diagnosed in September 2020. She is status post left upper lobectomy with lymph node dissection with tumor size of 3.2 cm and negative lymphadenopathy at that time. She had disease recurrence in July 2022 with anterior mediastinal lymphadenopathy which was consistent with metastatic non-small cell lung cancer.    Biomarker Findings Tumor Mutational Burden - 21 Muts/Mb Microsatellite status - MS-Stable Genomic Findings For a complete list of the genes assayed, please refer to the Appendix. KRAS G12V TP53 F134L 7 Disease relevant genes with no reportable alterations: ALK, BRAF, EGFR, ERBB2, MET, RET, ROS1   PDL1: 0%   PRIOR THERAPY: 1)  Left upper lobectomy under the care of Dr. Cliffton Asters in November 2020. 2) SBRT to the disease recurrence in the anterior mediastinal lymphadenopathy under the care of Dr. Mitzi Hansen. Last treatment 12/16/20. 3) Concurrent chemoradiation with carboplatin for an AUC of 2 and Taxol 45 mg/m2. First dose expected on 05/08/2022.  Status post 7 cycles with partial response.  CURRENT THERAPY: Consolidation treatment with immunotherapy with Imfinzi 1500 Mg IV every 4 weeks.  First dose Jul 26, 2022.  Status post 3 cycles .  INTERVAL HISTORY: Jennifer [name] 77 y.o. female returns to the clinic today for follow-up visit.  The patient is feeling fine today with no concerning complaints but very anxious about her scan results.  She denied having any current chest pain, shortness of breath, cough or hemoptysis.  She has no nausea, vomiting, diarrhea or constipation.  She has no headache or visual changes.  She has been tolerating her treatment with immunotherapy fairly  well.  She is here for evaluation and discussion of her scan results before starting cycle #4.  MEDICAL HISTORY: Past Medical History:  Diagnosis Date   Cancer (HCC)    Lung cancer   History of chicken pox    Hyperlipidemia    Hypertension    Menopause    Osteoporosis    Pneumonia    "walking" pneumonia   Pre-diabetes    Vitamin D deficiency     ALLERGIES:  is allergic to aspirin and latex.  MEDICATIONS:  Current Outpatient Medications  Medication Sig Dispense Refill   atorvastatin (LIPITOR) 40 MG tablet Take 40 mg by mouth every evening.      diltiazem (DILACOR XR) 240 MG 24 hr capsule Take 240 mg by mouth daily.     guaiFENesin (MUCINEX) 600 MG 12 hr tablet Take 1 tablet (600 mg total) by mouth 2 (two) times daily. (Patient taking differently: Take 600 mg by mouth daily.)     metoprolol succinate (TOPROL-XL) 50 MG 24 hr tablet Take 50 mg by mouth daily.     Multiple Vitamin (MULTIVITAMIN WITH MINERALS) TABS tablet Take 1 tablet by mouth daily.     triamterene-hydrochlorothiazide (MAXZIDE-25) 37.5-25 MG tablet Take 1 tablet by mouth daily.     No current facility-administered medications for this visit.    SURGICAL HISTORY:  Past Surgical History:  Procedure Laterality Date   CHEST TUBE INSERTION Left 01/19/2019   Procedure: Chest Tube Insertion;  Surgeon: Corliss Skains, MD;  Location: [facility] OR;  Service: Thoracic;  Laterality: Left;   PLEURADESIS N/A 01/06/2019   Procedure: Pleuradesis - Chemical;  Surgeon: Cliffton Asters,  Eliezer Lofts, MD;  Location: [facility] OR;  Service: Thoracic;  Laterality: N/A;   PLEURADESIS Left 01/19/2019   Procedure: Mechanical Pleuradesis;  Surgeon: Corliss Skains, MD;  Location: [facility] OR;  Service: Thoracic;  Laterality: Left;   VIDEO ASSISTED THORACOSCOPY Left 01/19/2019   Procedure: VIDEO ASSISTED THORACOSCOPY;  Surgeon: Corliss Skains, MD;  Location: [facility] OR;  Service: Thoracic;  Laterality: Left;   VIDEO ASSISTED THORACOSCOPY (VATS)/  LOBECTOMY Left 12/29/2018   Procedure: VIDEO ASSISTED THORACOSCOPY (VATS)/LEFT UPPER  LOBECTOMY with Mediastinal lymph node exploration.;  Surgeon: Corliss Skains, MD;  Location: [facility] OR;  Service: Thoracic;  Laterality: Left;   VIDEO BRONCHOSCOPY N/A 12/29/2018   Procedure: VIDEO BRONCHOSCOPY;  Surgeon: Corliss Skains, MD;  Location: [facility] OR;  Service: Thoracic;  Laterality: N/A;   VIDEO BRONCHOSCOPY WITH ENDOBRONCHIAL NAVIGATION N/A 12/09/2018   Procedure: VIDEO BRONCHOSCOPY WITH ENDOBRONCHIAL NAVIGATION;  Surgeon: Corliss Skains, MD;  Location: [facility] OR;  Service: Thoracic;  Laterality: N/A;   VIDEO BRONCHOSCOPY WITH INSERTION OF INTERBRONCHIAL VALVE (IBV) N/A 01/06/2019   Procedure: VIDEO BRONCHOSCOPY WITH INSERTION OF INTERBRONCHIAL VALVE (IBV);  Surgeon: Corliss Skains, MD;  Location: [facility] OR;  Service: Thoracic;  Laterality: N/A;   VIDEO BRONCHOSCOPY WITH INSERTION OF INTERBRONCHIAL VALVE (IBV) N/A 01/13/2019   Procedure: VIDEO BRONCHOSCOPY WITH INSERTION OF INTERBRONCHIAL VALVE (IBV);  Surgeon: Corliss Skains, MD;  Location: [facility] OR;  Service: Thoracic;  Laterality: N/A;   VIDEO BRONCHOSCOPY WITH INSERTION OF INTERBRONCHIAL VALVE (IBV) N/A 02/23/2019   Procedure: VIDEO BRONCHOSCOPY WITH REMOVAL OF INTERBRONCHIAL VALVE (IBV);  Surgeon: Corliss Skains, MD;  Location: [facility] OR;  Service: Thoracic;  Laterality: N/A;    REVIEW OF SYSTEMS:  Constitutional: negative Eyes: negative Ears, nose, mouth, throat, and face: negative Respiratory: negative Cardiovascular: negative Gastrointestinal: negative Genitourinary:negative Integument/breast: negative Hematologic/lymphatic: negative Musculoskeletal:negative Neurological: negative Behavioral/Psych: positive for anxiety Endocrine: negative Allergic/Immunologic: negative   PHYSICAL EXAMINATION: General appearance: alert, cooperative, and no distress Head: Normocephalic, without obvious abnormality, atraumatic Neck: no  adenopathy, no JVD, supple, symmetrical, trachea midline, and thyroid not enlarged, symmetric, no tenderness/mass/nodules Lymph nodes: Cervical, supraclavicular, and axillary nodes normal. Resp: clear to auscultation bilaterally Back: symmetric, no curvature. ROM normal. No CVA tenderness. Cardio: regular rate and rhythm, S1, S2 normal, no murmur, click, rub or gallop GI: soft, non-tender; bowel sounds normal; no masses,  no organomegaly Extremities: extremities normal, atraumatic, no cyanosis or edema Neurologic: Alert and oriented X 3, normal strength and tone. Normal symmetric reflexes. Normal coordination and gait  ECOG PERFORMANCE STATUS: 1 - Symptomatic but completely ambulatory  Blood pressure (!) 148/82, pulse 89, temperature 97.7 F (36.5 C), temperature source Oral, resp. rate 18, height 5\' 9"  (1.753 m), weight 162 lb 4.8 oz (73.6 kg), SpO2 100%.  LABORATORY DATA: Lab Results  Component Value Date   WBC 6.8 09/20/2022   HGB 11.5 (L) 09/20/2022   HCT 35.3 (L) 09/20/2022   MCV 93.1 09/20/2022   PLT 188 09/20/2022      Chemistry      Component Value Date/Time   NA 139 09/20/2022 1030   K 3.9 09/20/2022 1030   CL 100 09/20/2022 1030   CO2 29 09/20/2022 1030   BUN 18 09/20/2022 1030   CREATININE 1.28 (H) 09/20/2022 1030   GLU 845 10/28/2009 0000      Component Value Date/Time   CALCIUM 9.8 09/20/2022 1030   ALKPHOS 114 09/20/2022 1030   AST 19 09/20/2022 1030   ALT 15 09/20/2022 1030  BILITOT 0.4 09/20/2022 1030       RADIOGRAPHIC STUDIES: CT Chest W Contrast  Result Date: 10/18/2022 CLINICAL DATA:  Non-small cell lung cancer staging * Tracking Code: BO * EXAM: CT CHEST WITH CONTRAST TECHNIQUE: Multidetector CT imaging of the chest was performed during intravenous contrast administration. RADIATION DOSE REDUCTION: This exam was performed according to the departmental dose-optimization program which includes automated exposure control, adjustment of the mA and/or  kV according to patient size and/or use of iterative reconstruction technique. CONTRAST:  60mL OMNIPAQUE IOHEXOL 300 MG/ML  SOLN COMPARISON:  07/12/2022 FINDINGS: Cardiovascular: Thoracic aortic atherosclerosis. Mediastinum/Nodes: Left paratracheal node 0.6 cm in short axis on image 49 series 2, formerly 1.1 cm. Pretracheal node 0.9 cm in short axis on image 52 series 2, formerly the same. No pathologic adenopathy in the chest currently identified. Possible small amount of food material in the distal thoracic esophagus, query gastroesophageal reflux. Lungs/Pleura: Left upper lobectomy. Paramediastinal densities favoring prior radiation therapy. Stable 3 mm right lower lobe nodule on image 112 series 5, no change from 09/25/2019, considered benign. Centrilobular emphysema Upper Abdomen: Unremarkable Musculoskeletal: Mild thoracic spondylosis. IMPRESSION: 1. No findings of active malignancy. The previous left paratracheal/AP window lymph node is substantially reduced in size from prior, currently 0.6 cm in short axis. 2. Left upper lobectomy with paramediastinal densities favoring prior radiation therapy. 3. Possible small amount of food material in the distal thoracic esophagus, query gastroesophageal reflux. Aortic Atherosclerosis (ICD10-I70.0) and Emphysema (ICD10-J43.9). Electronically Signed   By: Gaylyn Rong M.D.   On: 10/18/2022 10:39    ASSESSMENT AND PLAN: This is a very pleasant 77 years old white female with recurrent non-small cell lung cancer that was initially diagnosed as stage Ib (T2 a, N0, M0) adenocarcinoma diagnosed in September 2020 status post left upper lobectomy with lymph node dissection with disease recurrence in July 2022 presenting with anterior mediastinal lymphadenopathy.  The patient is status post curative radiotherapy to this area under the care of Dr. Mitzi Hansen completed December 16, 2020.   She was found to have another evidence of disease recurrence in the AP window lymph nodes  in February 2024.  She has brain MRI that showed no evidence of metastatic disease to the brain. She underwent concurrent chemoradiation with weekly carboplatin for AUC of 2 and paclitaxel 45 Mg/M2.  First dose May 08, 2022.  Status post 7 cycles.  Last dose was given on 06/18/2022 with partial response. The patient tolerated her previous treatment fairly well except for fatigue and odynophagia. She also has mild alopecia. She is currently undergoing consolidation treatment with immunotherapy with Imfinzi 1500 Mg IV every 4 weeks.  She started the first dose on Jul 26, 2022.  Status post 3 cycles.   She has been tolerating her treatment with immunotherapy fairly well. She had repeat CT scan of the chest performed recently.  I personally and independently reviewed the scan and discussed the result with the patient today.  Her scan showed improvement of the previously noted left paratracheal/AP window lymph node and no evidence for disease progression.  She continues to have evolving radiation changes in the left upper paramediastinal area. I recommended for the patient to continue her current treatment with immunotherapy and she will proceed with cycle #4 today. I will see her back for follow-up visit in 4 weeks for evaluation before the next cycle of her treatment. She plans to travel to [place] after the next cycle of her treatment to visit her family. The patient was  advised to call immediately if she has any concerning symptoms in the interval. The patient voices understanding of current disease status and treatment options and is in agreement with the current care plan.  All questions were answered. The patient knows to call the clinic with any problems, questions or concerns. We can certainly see the patient much sooner if necessary.  The total time spent in the appointment was 30 minutes.  Disclaimer: This note was dictated with voice recognition software. Similar sounding words can  inadvertently be transcribed and may not be corrected upon review.

## 2022-10-18 NOTE — Patient Instructions (Signed)

## 2022-10-21 ENCOUNTER — Other Ambulatory Visit: Payer: Self-pay

## 2022-10-27 ENCOUNTER — Other Ambulatory Visit: Payer: Self-pay

## 2022-11-14 ENCOUNTER — Other Ambulatory Visit: Payer: Self-pay

## 2022-11-15 ENCOUNTER — Inpatient Hospital Stay: Payer: Medicare Other | Attending: Internal Medicine

## 2022-11-15 ENCOUNTER — Inpatient Hospital Stay (HOSPITAL_BASED_OUTPATIENT_CLINIC_OR_DEPARTMENT_OTHER): Payer: Medicare Other | Admitting: Internal Medicine

## 2022-11-15 ENCOUNTER — Inpatient Hospital Stay: Payer: Medicare Other

## 2022-11-15 DIAGNOSIS — Z902 Acquired absence of lung [part of]: Secondary | ICD-10-CM | POA: Diagnosis not present

## 2022-11-15 DIAGNOSIS — Z5112 Encounter for antineoplastic immunotherapy: Secondary | ICD-10-CM | POA: Diagnosis present

## 2022-11-15 DIAGNOSIS — C3490 Malignant neoplasm of unspecified part of unspecified bronchus or lung: Secondary | ICD-10-CM | POA: Diagnosis not present

## 2022-11-15 DIAGNOSIS — L659 Nonscarring hair loss, unspecified: Secondary | ICD-10-CM | POA: Diagnosis not present

## 2022-11-15 DIAGNOSIS — C3412 Malignant neoplasm of upper lobe, left bronchus or lung: Secondary | ICD-10-CM | POA: Insufficient documentation

## 2022-11-15 DIAGNOSIS — Z923 Personal history of irradiation: Secondary | ICD-10-CM | POA: Insufficient documentation

## 2022-11-15 LAB — CBC WITH DIFFERENTIAL (CANCER CENTER ONLY)
Abs Immature Granulocytes: 0.02 10*3/uL (ref 0.00–0.07)
Basophils Absolute: 0 10*3/uL (ref 0.0–0.1)
Basophils Relative: 1 %
Eosinophils Absolute: 0.1 10*3/uL (ref 0.0–0.5)
Eosinophils Relative: 2 %
HCT: 33.5 % — ABNORMAL LOW (ref 36.0–46.0)
Hemoglobin: 11.3 g/dL — ABNORMAL LOW (ref 12.0–15.0)
Immature Granulocytes: 0 %
Lymphocytes Relative: 16 %
Lymphs Abs: 1 10*3/uL (ref 0.7–4.0)
MCH: 29.7 pg (ref 26.0–34.0)
MCHC: 33.7 g/dL (ref 30.0–36.0)
MCV: 87.9 fL (ref 80.0–100.0)
Monocytes Absolute: 0.5 10*3/uL (ref 0.1–1.0)
Monocytes Relative: 8 %
Neutro Abs: 4.9 10*3/uL (ref 1.7–7.7)
Neutrophils Relative %: 73 %
Platelet Count: 185 10*3/uL (ref 150–400)
RBC: 3.81 MIL/uL — ABNORMAL LOW (ref 3.87–5.11)
RDW: 14.3 % (ref 11.5–15.5)
WBC Count: 6.6 10*3/uL (ref 4.0–10.5)
nRBC: 0 % (ref 0.0–0.2)

## 2022-11-15 LAB — CMP (CANCER CENTER ONLY)
ALT: 20 U/L (ref 0–44)
AST: 21 U/L (ref 15–41)
Albumin: 4.3 g/dL (ref 3.5–5.0)
Alkaline Phosphatase: 112 U/L (ref 38–126)
Anion gap: 10 (ref 5–15)
BUN: 22 mg/dL (ref 8–23)
CO2: 26 mmol/L (ref 22–32)
Calcium: 9.5 mg/dL (ref 8.9–10.3)
Chloride: 100 mmol/L (ref 98–111)
Creatinine: 1.28 mg/dL — ABNORMAL HIGH (ref 0.44–1.00)
GFR, Estimated: 43 mL/min — ABNORMAL LOW (ref >60)
Glucose, Bld: 162 mg/dL — ABNORMAL HIGH (ref 70–99)
Potassium: 3.5 mmol/L (ref 3.5–5.1)
Sodium: 136 mmol/L (ref 135–145)
Total Bilirubin: 0.6 mg/dL (ref 0.3–1.2)
Total Protein: 7 g/dL (ref 6.5–8.1)

## 2022-11-15 MED ORDER — SODIUM CHLORIDE 0.9 % IV SOLN: Freq: Once | INTRAVENOUS | Status: AC

## 2022-11-15 MED ORDER — SODIUM CHLORIDE 0.9 % IV SOLN
1500.0000 mg | Freq: Once | INTRAVENOUS | Status: AC
  Administered 2022-11-15: 1500 mg via INTRAVENOUS
  Filled 2022-11-15: qty 30

## 2022-11-15 NOTE — Progress Notes (Signed)
[facility] Telephone:(336) [phone]   Fax:(336) [phone]  OFFICE PROGRESS NOTE  Felix Pacini, FNP [address] [place] [address]  DIAGNOSIS: Recurrent non-small cell lung cancer that was initially diagnosed as stage IB (T2 a, N0, M0) non-small cell lung cancer, adenocarcinoma diagnosed in September 2020. She is status post left upper lobectomy with lymph node dissection with tumor size of 3.2 cm and negative lymphadenopathy at that time. She had disease recurrence in July 2022 with anterior mediastinal lymphadenopathy which was consistent with metastatic non-small cell lung cancer.    Biomarker Findings Tumor Mutational Burden - 21 Muts/Mb Microsatellite status - MS-Stable Genomic Findings For a complete list of the genes assayed, please refer to the Appendix. KRAS G12V TP53 F134L 7 Disease relevant genes with no reportable alterations: ALK, BRAF, EGFR, ERBB2, MET, RET, ROS1   PDL1: 0%   PRIOR THERAPY: 1)  Left upper lobectomy under the care of Dr. Cliffton Asters in November 2020. 2) SBRT to the disease recurrence in the anterior mediastinal lymphadenopathy under the care of Dr. Mitzi Hansen. Last treatment 12/16/20. 3) Concurrent chemoradiation with carboplatin for an AUC of 2 and Taxol 45 mg/m2. First dose expected on 05/08/2022.  Status post 7 cycles with partial response.  CURRENT THERAPY: Consolidation treatment with immunotherapy with Imfinzi 1500 Mg IV every 4 weeks.  First dose Jul 26, 2022.  Status post 4 cycles .  INTERVAL HISTORY: Jennifer [name] 77 y.o. female returns to the clinic today for follow-up visit.  The patient is feeling fine today with no concerning complaints.  She denied having any current chest pain, shortness of breath, cough or hemoptysis.  She has no nausea, vomiting, diarrhea or constipation.  She has no headache or visual changes.  She has no recent weight loss or night sweats.  She is traveling to [place] to visit her sister in 4  days.  The patient is here today for evaluation before starting cycle #5 of her treatment.  MEDICAL HISTORY: Past Medical History:  Diagnosis Date   Cancer (HCC)    Lung cancer   History of chicken pox    Hyperlipidemia    Hypertension    Menopause    Osteoporosis    Pneumonia    "walking" pneumonia   Pre-diabetes    Vitamin D deficiency     ALLERGIES:  is allergic to aspirin and latex.  MEDICATIONS:  Current Outpatient Medications  Medication Sig Dispense Refill   atorvastatin (LIPITOR) 40 MG tablet Take 40 mg by mouth every evening.      diltiazem (DILACOR XR) 240 MG 24 hr capsule Take 240 mg by mouth daily.     guaiFENesin (MUCINEX) 600 MG 12 hr tablet Take 1 tablet (600 mg total) by mouth 2 (two) times daily. (Patient taking differently: Take 600 mg by mouth daily.)     metoprolol succinate (TOPROL-XL) 50 MG 24 hr tablet Take 50 mg by mouth daily.     Multiple Vitamin (MULTIVITAMIN WITH MINERALS) TABS tablet Take 1 tablet by mouth daily.     triamterene-hydrochlorothiazide (MAXZIDE-25) 37.5-25 MG tablet Take 1 tablet by mouth daily.     No current facility-administered medications for this visit.    SURGICAL HISTORY:  Past Surgical History:  Procedure Laterality Date   CHEST TUBE INSERTION Left 01/19/2019   Procedure: Chest Tube Insertion;  Surgeon: Corliss Skains, MD;  Location: [facility] OR;  Service: Thoracic;  Laterality: Left;   PLEURADESIS N/A 01/06/2019   Procedure: Pleuradesis -  Chemical;  Surgeon: Corliss Skains, MD;  Location: [facility] OR;  Service: Thoracic;  Laterality: N/A;   PLEURADESIS Left 01/19/2019   Procedure: Mechanical Pleuradesis;  Surgeon: Corliss Skains, MD;  Location: [facility] OR;  Service: Thoracic;  Laterality: Left;   VIDEO ASSISTED THORACOSCOPY Left 01/19/2019   Procedure: VIDEO ASSISTED THORACOSCOPY;  Surgeon: Corliss Skains, MD;  Location: [facility] OR;  Service: Thoracic;  Laterality: Left;   VIDEO ASSISTED THORACOSCOPY (VATS)/ LOBECTOMY  Left 12/29/2018   Procedure: VIDEO ASSISTED THORACOSCOPY (VATS)/LEFT UPPER  LOBECTOMY with Mediastinal lymph node exploration.;  Surgeon: Corliss Skains, MD;  Location: [facility] OR;  Service: Thoracic;  Laterality: Left;   VIDEO BRONCHOSCOPY N/A 12/29/2018   Procedure: VIDEO BRONCHOSCOPY;  Surgeon: Corliss Skains, MD;  Location: [facility] OR;  Service: Thoracic;  Laterality: N/A;   VIDEO BRONCHOSCOPY WITH ENDOBRONCHIAL NAVIGATION N/A 12/09/2018   Procedure: VIDEO BRONCHOSCOPY WITH ENDOBRONCHIAL NAVIGATION;  Surgeon: Corliss Skains, MD;  Location: [facility] OR;  Service: Thoracic;  Laterality: N/A;   VIDEO BRONCHOSCOPY WITH INSERTION OF INTERBRONCHIAL VALVE (IBV) N/A 01/06/2019   Procedure: VIDEO BRONCHOSCOPY WITH INSERTION OF INTERBRONCHIAL VALVE (IBV);  Surgeon: Corliss Skains, MD;  Location: [facility] OR;  Service: Thoracic;  Laterality: N/A;   VIDEO BRONCHOSCOPY WITH INSERTION OF INTERBRONCHIAL VALVE (IBV) N/A 01/13/2019   Procedure: VIDEO BRONCHOSCOPY WITH INSERTION OF INTERBRONCHIAL VALVE (IBV);  Surgeon: Corliss Skains, MD;  Location: [facility] OR;  Service: Thoracic;  Laterality: N/A;   VIDEO BRONCHOSCOPY WITH INSERTION OF INTERBRONCHIAL VALVE (IBV) N/A 02/23/2019   Procedure: VIDEO BRONCHOSCOPY WITH REMOVAL OF INTERBRONCHIAL VALVE (IBV);  Surgeon: Corliss Skains, MD;  Location: [facility] OR;  Service: Thoracic;  Laterality: N/A;    REVIEW OF SYSTEMS:  A comprehensive review of systems was negative.   PHYSICAL EXAMINATION: General appearance: alert, cooperative, and no distress Head: Normocephalic, without obvious abnormality, atraumatic Neck: no adenopathy, no JVD, supple, symmetrical, trachea midline, and thyroid not enlarged, symmetric, no tenderness/mass/nodules Lymph nodes: Cervical, supraclavicular, and axillary nodes normal. Resp: clear to auscultation bilaterally Back: symmetric, no curvature. ROM normal. No CVA tenderness. Cardio: regular rate and rhythm, S1, S2 normal, no murmur,  click, rub or gallop GI: soft, non-tender; bowel sounds normal; no masses,  no organomegaly Extremities: extremities normal, atraumatic, no cyanosis or edema  ECOG PERFORMANCE STATUS: 1 - Symptomatic but completely ambulatory  Blood pressure (!) 174/69, pulse 96, temperature 97.9 F (36.6 C), temperature source Oral, resp. rate 17, height 5\' 9"  (1.753 m), weight 163 lb 8 oz (74.2 kg), SpO2 98%.  LABORATORY DATA: Lab Results  Component Value Date   WBC 6.6 11/15/2022   HGB 11.3 (L) 11/15/2022   HCT 33.5 (L) 11/15/2022   MCV 87.9 11/15/2022   PLT 185 11/15/2022      Chemistry      Component Value Date/Time   NA 137 10/18/2022 1111   K 3.6 10/18/2022 1111   CL 101 10/18/2022 1111   CO2 25 10/18/2022 1111   BUN 19 10/18/2022 1111   CREATININE 1.15 (H) 10/18/2022 1111   GLU 845 10/28/2009 0000      Component Value Date/Time   CALCIUM 9.7 10/18/2022 1111   ALKPHOS 109 10/18/2022 1111   AST 21 10/18/2022 1111   ALT 21 10/18/2022 1111   BILITOT 0.6 10/18/2022 1111       RADIOGRAPHIC STUDIES: No results found.  ASSESSMENT AND PLAN: This is a very pleasant 77 years old white female with recurrent non-small cell lung cancer that was initially  diagnosed as stage Ib (T2 a, N0, M0) adenocarcinoma diagnosed in September 2020 status post left upper lobectomy with lymph node dissection with disease recurrence in July 2022 presenting with anterior mediastinal lymphadenopathy.  The patient is status post curative radiotherapy to this area under the care of Dr. Mitzi Hansen completed December 16, 2020.   She was found to have another evidence of disease recurrence in the AP window lymph nodes in February 2024.  She has brain MRI that showed no evidence of metastatic disease to the brain. She underwent concurrent chemoradiation with weekly carboplatin for AUC of 2 and paclitaxel 45 Mg/M2.  First dose May 08, 2022.  Status post 7 cycles.  Last dose was given on 06/18/2022 with partial response. The  patient tolerated her previous treatment fairly well except for fatigue and odynophagia. She also has mild alopecia. She is currently undergoing consolidation treatment with immunotherapy with Imfinzi 1500 Mg IV every 4 weeks.  She started the first dose on Jul 26, 2022.  Status post 4 cycles.   The patient has been tolerating this treatment well with no concerning adverse effects. I recommended for her to proceed with cycle #5 today as planned. I will see her back for follow-up visit in 4 weeks for evaluation after she returns back from [place] and before the next cycle of her treatment. She was advised to call immediately if she has any other concerning symptoms in the interval. The patient voices understanding of current disease status and treatment options and is in agreement with the current care plan.  All questions were answered. The patient knows to call the clinic with any problems, questions or concerns. We can certainly see the patient much sooner if necessary.  The total time spent in the appointment was 20 minutes.  Disclaimer: This note was dictated with voice recognition software. Similar sounding words can inadvertently be transcribed and may not be corrected upon review.

## 2022-11-26 ENCOUNTER — Other Ambulatory Visit: Payer: Self-pay

## 2022-12-03 ENCOUNTER — Other Ambulatory Visit: Payer: Self-pay

## 2022-12-13 ENCOUNTER — Inpatient Hospital Stay (HOSPITAL_BASED_OUTPATIENT_CLINIC_OR_DEPARTMENT_OTHER): Payer: Medicare Other | Admitting: Internal Medicine

## 2022-12-13 ENCOUNTER — Inpatient Hospital Stay: Payer: Medicare Other | Attending: Internal Medicine

## 2022-12-13 ENCOUNTER — Inpatient Hospital Stay: Payer: Medicare Other

## 2022-12-13 VITALS — BP 132/85 | HR 97 | Temp 97.5°F | Resp 16 | Ht 69.0 in | Wt 163.6 lb

## 2022-12-13 DIAGNOSIS — Z5112 Encounter for antineoplastic immunotherapy: Secondary | ICD-10-CM | POA: Insufficient documentation

## 2022-12-13 DIAGNOSIS — C3412 Malignant neoplasm of upper lobe, left bronchus or lung: Secondary | ICD-10-CM | POA: Insufficient documentation

## 2022-12-13 DIAGNOSIS — Z79620 Long term (current) use of immunosuppressive biologic: Secondary | ICD-10-CM | POA: Insufficient documentation

## 2022-12-13 DIAGNOSIS — L659 Nonscarring hair loss, unspecified: Secondary | ICD-10-CM | POA: Diagnosis not present

## 2022-12-13 DIAGNOSIS — C3490 Malignant neoplasm of unspecified part of unspecified bronchus or lung: Secondary | ICD-10-CM

## 2022-12-13 DIAGNOSIS — Z923 Personal history of irradiation: Secondary | ICD-10-CM | POA: Diagnosis not present

## 2022-12-13 DIAGNOSIS — Z902 Acquired absence of lung [part of]: Secondary | ICD-10-CM | POA: Diagnosis not present

## 2022-12-13 LAB — CBC WITH DIFFERENTIAL (CANCER CENTER ONLY)
Abs Immature Granulocytes: 0.03 10*3/uL (ref 0.00–0.07)
Basophils Absolute: 0 10*3/uL (ref 0.0–0.1)
Basophils Relative: 0 %
Eosinophils Absolute: 0.2 10*3/uL (ref 0.0–0.5)
Eosinophils Relative: 2 %
HCT: 37.1 % (ref 36.0–46.0)
Hemoglobin: 12.9 g/dL (ref 12.0–15.0)
Immature Granulocytes: 0 %
Lymphocytes Relative: 23 %
Lymphs Abs: 1.8 10*3/uL (ref 0.7–4.0)
MCH: 30.9 pg (ref 26.0–34.0)
MCHC: 34.8 g/dL (ref 30.0–36.0)
MCV: 89 fL (ref 80.0–100.0)
Monocytes Absolute: 0.5 10*3/uL (ref 0.1–1.0)
Monocytes Relative: 7 %
Neutro Abs: 5.2 10*3/uL (ref 1.7–7.7)
Neutrophils Relative %: 68 %
Platelet Count: 216 10*3/uL (ref 150–400)
RBC: 4.17 MIL/uL (ref 3.87–5.11)
RDW: 13.8 % (ref 11.5–15.5)
WBC Count: 7.8 10*3/uL (ref 4.0–10.5)
nRBC: 0 % (ref 0.0–0.2)

## 2022-12-13 LAB — CMP (CANCER CENTER ONLY)
ALT: 16 U/L (ref 0–44)
AST: 20 U/L (ref 15–41)
Albumin: 4.5 g/dL (ref 3.5–5.0)
Alkaline Phosphatase: 134 U/L — ABNORMAL HIGH (ref 38–126)
Anion gap: 12 (ref 5–15)
BUN: 17 mg/dL (ref 8–23)
CO2: 25 mmol/L (ref 22–32)
Calcium: 9.8 mg/dL (ref 8.9–10.3)
Chloride: 101 mmol/L (ref 98–111)
Creatinine: 1.43 mg/dL — ABNORMAL HIGH (ref 0.44–1.00)
GFR, Estimated: 38 mL/min — ABNORMAL LOW (ref >60)
Glucose, Bld: 126 mg/dL — ABNORMAL HIGH (ref 70–99)
Potassium: 3.4 mmol/L — ABNORMAL LOW (ref 3.5–5.1)
Sodium: 138 mmol/L (ref 135–145)
Total Bilirubin: 0.5 mg/dL (ref 0.3–1.2)
Total Protein: 7.5 g/dL (ref 6.5–8.1)

## 2022-12-13 LAB — TSH: TSH: 0.495 u[IU]/mL (ref 0.350–4.500)

## 2022-12-13 MED ORDER — SODIUM CHLORIDE 0.9 % IV SOLN: Freq: Once | INTRAVENOUS | Status: AC

## 2022-12-13 MED ORDER — SODIUM CHLORIDE 0.9 % IV SOLN
1500.0000 mg | Freq: Once | INTRAVENOUS | Status: AC
  Administered 2022-12-13: 1500 mg via INTRAVENOUS
  Filled 2022-12-13: qty 30

## 2022-12-13 NOTE — Progress Notes (Signed)
[facility] Telephone:(336) [phone]   Fax:([phone]  OFFICE PROGRESS NOTE  Felix Pacini, FNP [address] [place] [address]  DIAGNOSIS: Recurrent non-small cell lung cancer that was initially diagnosed as stage IB (T2 a, N0, M0) non-small cell lung cancer, adenocarcinoma diagnosed in September 2020. She is status post left upper lobectomy with lymph node dissection with tumor size of 3.2 cm and negative lymphadenopathy at that time. She had disease recurrence in July 2022 with anterior mediastinal lymphadenopathy which was consistent with metastatic non-small cell lung cancer.    Biomarker Findings Tumor Mutational Burden - 21 Muts/Mb Microsatellite status - MS-Stable Genomic Findings For a complete list of the genes assayed, please refer to the Appendix. KRAS G12V TP53 F134L 7 Disease relevant genes with no reportable alterations: ALK, BRAF, EGFR, ERBB2, MET, RET, ROS1   PDL1: 0%   PRIOR THERAPY: 1)  Left upper lobectomy under the care of Dr. Cliffton Asters in November 2020. 2) SBRT to the disease recurrence in the anterior mediastinal lymphadenopathy under the care of Dr. Mitzi Hansen. Last treatment 12/16/20. 3) Concurrent chemoradiation with carboplatin for an AUC of 2 and Taxol 45 mg/m2. First dose expected on 05/08/2022.  Status post 7 cycles with partial response.  CURRENT THERAPY: Consolidation treatment with immunotherapy with Imfinzi 1500 Mg IV every 4 weeks.  First dose Jul 26, 2022.  Status post 5 cycles .  INTERVAL HISTORY: Jennifer [name] 77 y.o. female returns to the clinic today for follow-up visit.Discussed the use of AI scribe software for clinical note transcription with the patient, who gave verbal consent to proceed.  History of Present Illness   Jennifer [name], a 77 year old individual with a history of stage 1B non-small cell lung cancer, initially diagnosed in September 2020, underwent a left upper lobe resection. They experienced disease  recurrence in July 2022, which was managed with weekly carboplatin and paclitaxel, followed by immunotherapy with Imfinzi. They have received five cycles of Imfinzi 1500 mg every three weeks to date.  Recently, they spent three weeks in [place], where they engaged in a significant amount of walking. Despite indulging in local cuisine, they did not report any significant weight gain. They deny any current complaints, including chest pain, breathing issues, nausea, vomiting, or diarrhea. They also report no new symptoms since their last treatment.       MEDICAL HISTORY: Past Medical History:  Diagnosis Date   Cancer (HCC)    Lung cancer   History of chicken pox    Hyperlipidemia    Hypertension    Menopause    Osteoporosis    Pneumonia    "walking" pneumonia   Pre-diabetes    Vitamin D deficiency     ALLERGIES:  is allergic to aspirin and latex.  MEDICATIONS:  Current Outpatient Medications  Medication Sig Dispense Refill   atorvastatin (LIPITOR) 40 MG tablet Take 40 mg by mouth every evening.      diltiazem (DILACOR XR) 240 MG 24 hr capsule Take 240 mg by mouth daily.     guaiFENesin (MUCINEX) 600 MG 12 hr tablet Take 1 tablet (600 mg total) by mouth 2 (two) times daily. (Patient taking differently: Take 600 mg by mouth daily.)     metoprolol succinate (TOPROL-XL) 50 MG 24 hr tablet Take 50 mg by mouth daily.     Multiple Vitamin (MULTIVITAMIN WITH MINERALS) TABS tablet Take 1 tablet by mouth daily.     triamterene-hydrochlorothiazide (MAXZIDE-25) 37.5-25 MG tablet Take 1 tablet by mouth  daily.     No current facility-administered medications for this visit.    SURGICAL HISTORY:  Past Surgical History:  Procedure Laterality Date   CHEST TUBE INSERTION Left 01/19/2019   Procedure: Chest Tube Insertion;  Surgeon: Corliss Skains, MD;  Location: [facility] OR;  Service: Thoracic;  Laterality: Left;   PLEURADESIS N/A 01/06/2019   Procedure: Pleuradesis - Chemical;  Surgeon:  Corliss Skains, MD;  Location: [facility] OR;  Service: Thoracic;  Laterality: N/A;   PLEURADESIS Left 01/19/2019   Procedure: Mechanical Pleuradesis;  Surgeon: Corliss Skains, MD;  Location: [facility] OR;  Service: Thoracic;  Laterality: Left;   VIDEO ASSISTED THORACOSCOPY Left 01/19/2019   Procedure: VIDEO ASSISTED THORACOSCOPY;  Surgeon: Corliss Skains, MD;  Location: [facility] OR;  Service: Thoracic;  Laterality: Left;   VIDEO ASSISTED THORACOSCOPY (VATS)/ LOBECTOMY Left 12/29/2018   Procedure: VIDEO ASSISTED THORACOSCOPY (VATS)/LEFT UPPER  LOBECTOMY with Mediastinal lymph node exploration.;  Surgeon: Corliss Skains, MD;  [facility]: [facility];  Service: Thoracic;  Laterality: Left;   VIDEO BRONCHOSCOPY N/A 12/29/2018   Procedure: VIDEO BRONCHOSCOPY;  Surgeon: Corliss Skains, MD;  [facility]: [facility];  Service: Thoracic;  Laterality: N/A;   VIDEO BRONCHOSCOPY WITH ENDOBRONCHIAL NAVIGATION N/A 12/09/2018   Procedure: VIDEO BRONCHOSCOPY WITH ENDOBRONCHIAL NAVIGATION;  Surgeon: Corliss Skains, MD;  [facility]: [facility];  Service: Thoracic;  Laterality: N/A;   VIDEO BRONCHOSCOPY WITH INSERTION OF INTERBRONCHIAL VALVE (IBV) N/A 01/06/2019   Procedure: VIDEO BRONCHOSCOPY WITH INSERTION OF INTERBRONCHIAL VALVE (IBV);  Surgeon: Corliss Skains, MD;  Location: [facility] OR;  Service: Thoracic;  Laterality: N/A;   VIDEO BRONCHOSCOPY WITH INSERTION OF INTERBRONCHIAL VALVE (IBV) N/A 01/13/2019   Procedure: VIDEO BRONCHOSCOPY WITH INSERTION OF INTERBRONCHIAL VALVE (IBV);  Surgeon: Corliss Skains, MD;  Location: [facility] OR;  Service: Thoracic;  Laterality: N/A;   VIDEO BRONCHOSCOPY WITH INSERTION OF INTERBRONCHIAL VALVE (IBV) N/A 02/23/2019   Procedure: VIDEO BRONCHOSCOPY WITH REMOVAL OF INTERBRONCHIAL VALVE (IBV);  Surgeon: Corliss Skains, MD;  Location: [facility] OR;  Service: Thoracic;  Laterality: N/A;    REVIEW OF SYSTEMS:  A comprehensive review of systems was negative.   PHYSICAL EXAMINATION: General  appearance: alert, cooperative, and no distress Head: Normocephalic, without obvious abnormality, atraumatic Neck: no adenopathy, no JVD, supple, symmetrical, trachea midline, and thyroid not enlarged, symmetric, no tenderness/mass/nodules Lymph nodes: Cervical, supraclavicular, and axillary nodes normal. Resp: clear to auscultation bilaterally Back: symmetric, no curvature. ROM normal. No CVA tenderness. Cardio: regular rate and rhythm, S1, S2 normal, no murmur, click, rub or gallop GI: soft, non-tender; bowel sounds normal; no masses,  no organomegaly Extremities: extremities normal, atraumatic, no cyanosis or edema  ECOG PERFORMANCE STATUS: 1 - Symptomatic but completely ambulatory  Blood pressure 132/85, pulse 97, temperature (!) 97.5 F (36.4 C), temperature source Oral, resp. rate 16, height 5\' 9"  (1.753 m), weight 163 lb 9.6 oz (74.2 kg), SpO2 100%.  LABORATORY DATA: Lab Results  Component Value Date   WBC 7.8 12/13/2022   HGB 12.9 12/13/2022   HCT 37.1 12/13/2022   MCV 89.0 12/13/2022   PLT 216 12/13/2022      Chemistry      Component Value Date/Time   NA 138 12/13/2022 0901   K 3.4 (L) 12/13/2022 0901   CL 101 12/13/2022 0901   CO2 25 12/13/2022 0901   BUN 17 12/13/2022 0901   CREATININE 1.43 (H) 12/13/2022 0901   GLU 845 10/28/2009 0000      Component Value Date/Time  CALCIUM 9.8 12/13/2022 0901   ALKPHOS 134 (H) 12/13/2022 0901   AST 20 12/13/2022 0901   ALT 16 12/13/2022 0901   BILITOT 0.5 12/13/2022 0901       RADIOGRAPHIC STUDIES: No results found.  ASSESSMENT AND PLAN: This is a very pleasant 77 years old white female with recurrent non-small cell lung cancer that was initially diagnosed as stage Ib (T2 a, N0, M0) adenocarcinoma diagnosed in September 2020 status post left upper lobectomy with lymph node dissection with disease recurrence in July 2022 presenting with anterior mediastinal lymphadenopathy.  The patient is status post curative  radiotherapy to this area under the care of Dr. Mitzi Hansen completed December 16, 2020.   She was found to have another evidence of disease recurrence in the AP window lymph nodes in February 2024.  She has brain MRI that showed no evidence of metastatic disease to the brain. She underwent concurrent chemoradiation with weekly carboplatin for AUC of 2 and paclitaxel 45 Mg/M2.  First dose May 08, 2022.  Status post 7 cycles.  Last dose was given on 06/18/2022 with partial response. The patient tolerated her previous treatment fairly well except for fatigue and odynophagia. She also has mild alopecia. She is currently undergoing consolidation treatment with immunotherapy with Imfinzi 1500 Mg IV every 4 weeks.  She started the first dose on Jul 26, 2022.  Status post 5 cycles.  She has been tolerating this treatment fairly well.    Recurrent Non-Small Cell Lung Cancer Patient is currently on cycle 6 of immunotherapy with Imfinzi (1500mg  every 3 weeks). No new complaints or side effects reported. -Continue Imfinzi 1500mg  every 3 weeks. -Order chest scan in 3 weeks to assess response to treatment. -Follow-up appointment in 4 weeks.   She was advised to call immediately if she has any other concerning symptoms in the interval. The patient voices understanding of current disease status and treatment options and is in agreement with the current care plan.  All questions were answered. The patient knows to call the clinic with any problems, questions or concerns. We can certainly see the patient much sooner if necessary.  The total time spent in the appointment was 20 minutes.  Disclaimer: This note was dictated with voice recognition software. Similar sounding words can inadvertently be transcribed and may not be corrected upon review.

## 2022-12-14 ENCOUNTER — Other Ambulatory Visit: Payer: Self-pay

## 2022-12-14 LAB — T4: T4, Total: 6.6 ug/dL (ref 4.5–12.0)

## 2023-01-03 ENCOUNTER — Ambulatory Visit (HOSPITAL_COMMUNITY)
Admission: RE | Admit: 2023-01-03 | Discharge: 2023-01-03 | Disposition: A | Discharge status: Home / Self Care | Payer: Medicare Other | Source: Ambulatory Visit | Attending: Internal Medicine | Admitting: Internal Medicine

## 2023-01-03 DIAGNOSIS — C3490 Malignant neoplasm of unspecified part of unspecified bronchus or lung: Secondary | ICD-10-CM | POA: Diagnosis present

## 2023-01-03 MED ORDER — IOHEXOL 300 MG/ML  SOLN
60.0000 mL | Freq: Once | INTRAMUSCULAR | Status: AC | PRN
  Administered 2023-01-03: 60 mL via INTRAVENOUS

## 2023-01-06 ENCOUNTER — Other Ambulatory Visit: Payer: Self-pay

## 2023-01-10 ENCOUNTER — Inpatient Hospital Stay: Payer: Medicare Other | Attending: Internal Medicine

## 2023-01-10 ENCOUNTER — Inpatient Hospital Stay: Payer: Medicare Other

## 2023-01-10 ENCOUNTER — Encounter: Payer: Self-pay | Admitting: Internal Medicine

## 2023-01-10 ENCOUNTER — Inpatient Hospital Stay: Payer: Medicare Other | Admitting: Internal Medicine

## 2023-01-10 VITALS — BP 124/71 | HR 89 | Temp 97.7°F | Resp 16 | Ht 69.0 in | Wt 163.5 lb

## 2023-01-10 DIAGNOSIS — R590 Localized enlarged lymph nodes: Secondary | ICD-10-CM | POA: Diagnosis not present

## 2023-01-10 DIAGNOSIS — Z902 Acquired absence of lung [part of]: Secondary | ICD-10-CM | POA: Insufficient documentation

## 2023-01-10 DIAGNOSIS — C3412 Malignant neoplasm of upper lobe, left bronchus or lung: Secondary | ICD-10-CM | POA: Diagnosis not present

## 2023-01-10 DIAGNOSIS — L659 Nonscarring hair loss, unspecified: Secondary | ICD-10-CM | POA: Insufficient documentation

## 2023-01-10 DIAGNOSIS — Z5112 Encounter for antineoplastic immunotherapy: Secondary | ICD-10-CM | POA: Diagnosis present

## 2023-01-10 DIAGNOSIS — C3492 Malignant neoplasm of unspecified part of left bronchus or lung: Secondary | ICD-10-CM

## 2023-01-10 DIAGNOSIS — C3490 Malignant neoplasm of unspecified part of unspecified bronchus or lung: Secondary | ICD-10-CM

## 2023-01-10 LAB — CBC WITH DIFFERENTIAL (CANCER CENTER ONLY)
Abs Immature Granulocytes: 0.04 10*3/uL (ref 0.00–0.07)
Basophils Absolute: 0 10*3/uL (ref 0.0–0.1)
Basophils Relative: 0 %
Eosinophils Absolute: 0.2 10*3/uL (ref 0.0–0.5)
Eosinophils Relative: 3 %
HCT: 37.6 % (ref 36.0–46.0)
Hemoglobin: 12.6 g/dL (ref 12.0–15.0)
Immature Granulocytes: 1 %
Lymphocytes Relative: 22 %
Lymphs Abs: 1.8 10*3/uL (ref 0.7–4.0)
MCH: 30.3 pg (ref 26.0–34.0)
MCHC: 33.5 g/dL (ref 30.0–36.0)
MCV: 90.4 fL (ref 80.0–100.0)
Monocytes Absolute: 0.6 10*3/uL (ref 0.1–1.0)
Monocytes Relative: 7 %
Neutro Abs: 5.5 10*3/uL (ref 1.7–7.7)
Neutrophils Relative %: 67 %
Platelet Count: 244 10*3/uL (ref 150–400)
RBC: 4.16 MIL/uL (ref 3.87–5.11)
RDW: 13.9 % (ref 11.5–15.5)
WBC Count: 8.1 10*3/uL (ref 4.0–10.5)
nRBC: 0 % (ref 0.0–0.2)

## 2023-01-10 LAB — CMP (CANCER CENTER ONLY)
ALT: 20 U/L (ref 0–44)
AST: 19 U/L (ref 15–41)
Albumin: 4.4 g/dL (ref 3.5–5.0)
Alkaline Phosphatase: 119 U/L (ref 38–126)
Anion gap: 11 (ref 5–15)
BUN: 21 mg/dL (ref 8–23)
CO2: 27 mmol/L (ref 22–32)
Calcium: 9.7 mg/dL (ref 8.9–10.3)
Chloride: 101 mmol/L (ref 98–111)
Creatinine: 1.35 mg/dL — ABNORMAL HIGH (ref 0.44–1.00)
GFR, Estimated: 41 mL/min — ABNORMAL LOW (ref >60)
Glucose, Bld: 117 mg/dL — ABNORMAL HIGH (ref 70–99)
Potassium: 3.5 mmol/L (ref 3.5–5.1)
Sodium: 139 mmol/L (ref 135–145)
Total Bilirubin: 0.6 mg/dL (ref <1.2)
Total Protein: 7.5 g/dL (ref 6.5–8.1)

## 2023-01-10 MED ORDER — SODIUM CHLORIDE 0.9 % IV SOLN
1500.0000 mg | Freq: Once | INTRAVENOUS | Status: AC
  Administered 2023-01-10: 1500 mg via INTRAVENOUS
  Filled 2023-01-10: qty 30

## 2023-01-10 MED ORDER — SODIUM CHLORIDE 0.9 % IV SOLN: Freq: Once | INTRAVENOUS | Status: AC

## 2023-01-10 NOTE — Progress Notes (Signed)
[facility] [phone]   Fax:(336) [phone]  OFFICE PROGRESS NOTE  Jennifer Pacini, FNP [address] [place] [address]  DIAGNOSIS: Recurrent non-small cell lung cancer that was initially diagnosed as stage IB (T2 a, N0, M0) non-small cell lung cancer, adenocarcinoma diagnosed in September 2020. She is status post left upper lobectomy with lymph node dissection with tumor size of 3.2 cm and negative lymphadenopathy at that time. She had disease recurrence in July 2022 with anterior mediastinal lymphadenopathy which was consistent with metastatic non-small cell lung cancer.    Biomarker Findings Tumor Mutational Burden - 21 Muts/Mb Microsatellite status - MS-Stable Genomic Findings For a complete list of the genes assayed, please refer to the Appendix. KRAS G12V TP53 F134L 7 Disease relevant genes with no reportable alterations: ALK, BRAF, EGFR, ERBB2, MET, RET, ROS1   PDL1: 0%   PRIOR THERAPY: 1)  Left upper lobectomy under the care of Dr. Cliffton Asters in November 2020. 2) SBRT to the disease recurrence in the anterior mediastinal lymphadenopathy under the care of Dr. Mitzi Hansen. Last treatment 12/16/20. 3) Concurrent chemoradiation with carboplatin for an AUC of 2 and Taxol 45 mg/m2. First dose expected on 05/08/2022.  Status post 7 cycles with partial response.  CURRENT THERAPY: Consolidation treatment with immunotherapy with Imfinzi 1500 Mg IV every 4 weeks.  First dose Jul 26, 2022.  Status post 6 cycles .  INTERVAL HISTORY: Jennifer [name] 77 y.o. female returns to the clinic today for follow-up visit. Discussed the use of AI scribe software for clinical note transcription with the patient, who gave verbal consent to proceed.  History of Present Illness   Jennifer [name], a 77 year old patient with a history of recurrent non-small cell lung cancer adenocarcinoma diagnosed in July 2022, has been undergoing treatment with chemotherapy, radiation, and  immunotherapy. The immunotherapy regimen includes Imfinzi, administered every four weeks. At the time of the consultation, the patient had completed six cycles of this treatment.  The patient reported a recent cold, which had resolved but left residual symptoms in the form of head congestion. There was no associated chest congestion, fever, chills, nausea, or vomiting. The patient denied any current cough or expectoration. There was no reported shortness of breath.  In addition to the cancer-related symptoms, the patient mentioned a recent minor injury from a tree-cutting incident, which resulted in a facial mark. However, this did not seem to be a significant concern for the patient.      MEDICAL HISTORY: Past Medical History:  Diagnosis Date   Cancer (HCC)    Lung cancer   History of chicken pox    Hyperlipidemia    Hypertension    Menopause    Osteoporosis    Pneumonia    "walking" pneumonia   Pre-diabetes    Vitamin D deficiency     ALLERGIES:  is allergic to aspirin and latex.  MEDICATIONS:  Current Outpatient Medications  Medication Sig Dispense Refill   atorvastatin (LIPITOR) 40 MG tablet Take 40 mg by mouth every evening.      diltiazem (DILACOR XR) 240 MG 24 hr capsule Take 240 mg by mouth daily.     guaiFENesin (MUCINEX) 600 MG 12 hr tablet Take 1 tablet (600 mg total) by mouth 2 (two) times daily. (Patient taking differently: Take 600 mg by mouth daily.)     metoprolol succinate (TOPROL-XL) 50 MG 24 hr tablet Take 50 mg by mouth daily.     Multiple Vitamin (MULTIVITAMIN WITH MINERALS)  TABS tablet Take 1 tablet by mouth daily.     triamterene-hydrochlorothiazide (MAXZIDE-25) 37.5-25 MG tablet Take 1 tablet by mouth daily.     No current facility-administered medications for this visit.    SURGICAL HISTORY:  Past Surgical History:  Procedure Laterality Date   CHEST TUBE INSERTION Left 01/19/2019   Procedure: Chest Tube Insertion;  Surgeon: Corliss Skains, MD;   Location: [facility] OR;  Service: Thoracic;  Laterality: Left;   PLEURADESIS N/A 01/06/2019   Procedure: Pleuradesis - Chemical;  Surgeon: Corliss Skains, MD;  Location: [facility] OR;  Service: Thoracic;  Laterality: N/A;   PLEURADESIS Left 01/19/2019   Procedure: Mechanical Pleuradesis;  Surgeon: Corliss Skains, MD;  Location: [facility] OR;  Service: Thoracic;  Laterality: Left;   VIDEO ASSISTED THORACOSCOPY Left 01/19/2019   Procedure: VIDEO ASSISTED THORACOSCOPY;  Surgeon: Corliss Skains, MD;  Location: [facility] OR;  Service: Thoracic;  Laterality: Left;   VIDEO ASSISTED THORACOSCOPY (VATS)/ LOBECTOMY Left 12/29/2018   Procedure: VIDEO ASSISTED THORACOSCOPY (VATS)/LEFT UPPER  LOBECTOMY with Mediastinal lymph node exploration.;  Surgeon: Corliss Skains, MD;  Location: [facility] OR;  Service: Thoracic;  Laterality: Left;   VIDEO BRONCHOSCOPY N/A 12/29/2018   Procedure: VIDEO BRONCHOSCOPY;  Surgeon: Corliss Skains, MD;  Location: [facility] OR;  Service: Thoracic;  Laterality: N/A;   VIDEO BRONCHOSCOPY WITH ENDOBRONCHIAL NAVIGATION N/A 12/09/2018   Procedure: VIDEO BRONCHOSCOPY WITH ENDOBRONCHIAL NAVIGATION;  Surgeon: Corliss Skains, MD;  Location: [facility] OR;  Service: Thoracic;  Laterality: N/A;   VIDEO BRONCHOSCOPY WITH INSERTION OF INTERBRONCHIAL VALVE (IBV) N/A 01/06/2019   Procedure: VIDEO BRONCHOSCOPY WITH INSERTION OF INTERBRONCHIAL VALVE (IBV);  Surgeon: Corliss Skains, MD;  Location: [facility] OR;  Service: Thoracic;  Laterality: N/A;   VIDEO BRONCHOSCOPY WITH INSERTION OF INTERBRONCHIAL VALVE (IBV) N/A 01/13/2019   Procedure: VIDEO BRONCHOSCOPY WITH INSERTION OF INTERBRONCHIAL VALVE (IBV);  Surgeon: Corliss Skains, MD;  Location: [facility] OR;  Service: Thoracic;  Laterality: N/A;   VIDEO BRONCHOSCOPY WITH INSERTION OF INTERBRONCHIAL VALVE (IBV) N/A 02/23/2019   Procedure: VIDEO BRONCHOSCOPY WITH REMOVAL OF INTERBRONCHIAL VALVE (IBV);  Surgeon: Corliss Skains, MD;  Location: [facility] OR;  Service:  Thoracic;  Laterality: N/A;    REVIEW OF SYSTEMS:  Constitutional: negative Eyes: negative Ears, nose, mouth, throat, and face: positive for nasal congestion Respiratory: negative Cardiovascular: negative Gastrointestinal: negative Genitourinary:negative Integument/breast: negative Hematologic/lymphatic: negative Musculoskeletal:negative Neurological: negative Behavioral/Psych: negative Endocrine: negative Allergic/Immunologic: negative   PHYSICAL EXAMINATION: General appearance: alert, cooperative, and no distress Head: Normocephalic, without obvious abnormality, atraumatic Neck: no adenopathy, no JVD, supple, symmetrical, trachea midline, and thyroid not enlarged, symmetric, no tenderness/mass/nodules Lymph nodes: Cervical, supraclavicular, and axillary nodes normal. Resp: clear to auscultation bilaterally Back: symmetric, no curvature. ROM normal. No CVA tenderness. Cardio: regular rate and rhythm, S1, S2 normal, no murmur, click, rub or gallop GI: soft, non-tender; bowel sounds normal; no masses,  no organomegaly Extremities: extremities normal, atraumatic, no cyanosis or edema Neurologic: Alert and oriented X 3, normal strength and tone. Normal symmetric reflexes. Normal coordination and gait  ECOG PERFORMANCE STATUS: 1 - Symptomatic but completely ambulatory  Blood pressure 124/71, pulse 89, temperature 97.7 F (36.5 C), temperature source Temporal, resp. rate 16, height 5\' 9"  (1.753 m), weight 163 lb 8 oz (74.2 kg), SpO2 100%.  LABORATORY DATA: Lab Results  Component Value Date   WBC 8.1 01/10/2023   HGB 12.6 01/10/2023   HCT 37.6 01/10/2023   MCV 90.4 01/10/2023   PLT 244 01/10/2023  Chemistry      Component Value Date/Time   NA 139 01/10/2023 0923   K 3.5 01/10/2023 0923   CL 101 01/10/2023 0923   CO2 27 01/10/2023 0923   BUN 21 01/10/2023 0923   CREATININE 1.35 (H) 01/10/2023 0923   GLU 845 10/28/2009 0000      Component Value Date/Time   CALCIUM  9.7 01/10/2023 0923   ALKPHOS 119 01/10/2023 0923   AST 19 01/10/2023 0923   ALT 20 01/10/2023 0923   BILITOT 0.6 01/10/2023 0923       RADIOGRAPHIC STUDIES: CT Chest W Contrast  Result Date: 01/10/2023 CLINICAL DATA:  Non-small cell lung cancer.  * Tracking Code: BO * EXAM: CT CHEST WITH CONTRAST TECHNIQUE: Multidetector CT imaging of the chest was performed during intravenous contrast administration. RADIATION DOSE REDUCTION: This exam was performed according to the departmental dose-optimization program which includes automated exposure control, adjustment of the mA and/or kV according to patient size and/or use of iterative reconstruction technique. CONTRAST:  60mL OMNIPAQUE IOHEXOL 300 MG/ML  SOLN COMPARISON:  CT 10/12/2022 FINDINGS: Cardiovascular: No significant vascular findings. Normal heart size. No pericardial effusion. Mediastinum/Nodes: No axillary or supraclavicular adenopathy. No mediastinal or hilar adenopathy. No pericardial fluid. Esophagus normal. Lungs/Pleura: LEFT upper lobectomy. No nodularity in the LEFT lung. Volume loss is suspected. RIGHT lung is clear. Upper Abdomen: Limited view of the liver, kidneys, pancreas are unremarkable. Normal adrenal glands. Musculoskeletal: No aggressive osseous lesion. IMPRESSION: 1. Post LEFT upper lobectomy. No evidence of lung cancer recurrence. 2. No evidence of mediastinal adenopathy. 3. No interval change. Electronically Signed   By: Genevive Bi M.D.   On: 01/10/2023 08:21    ASSESSMENT AND PLAN: This is a very pleasant 77 years old white female with recurrent non-small cell lung cancer that was initially diagnosed as stage Ib (T2 a, N0, M0) adenocarcinoma diagnosed in September 2020 status post left upper lobectomy with lymph node dissection with disease recurrence in July 2022 presenting with anterior mediastinal lymphadenopathy.  The patient is status post curative radiotherapy to this area under the care of Dr. Mitzi Hansen completed  December 16, 2020.   She was found to have another evidence of disease recurrence in the AP window lymph nodes in February 2024.  She has brain MRI that showed no evidence of metastatic disease to the brain. She underwent concurrent chemoradiation with weekly carboplatin for AUC of 2 and paclitaxel 45 Mg/M2.  First dose May 08, 2022.  Status post 7 cycles.  Last dose was given on 06/18/2022 with partial response. The patient tolerated her previous treatment fairly well except for fatigue and odynophagia. She also has mild alopecia. She is currently undergoing consolidation treatment with immunotherapy with Imfinzi 1500 Mg IV every 4 weeks.  She started the first dose on Jul 26, 2022.  Status post 6 cycles.  She has been tolerating this treatment fairly well. The patient had repeat CT scan of the chest performed recently.  I personally and independently reviewed the scan and discussed the result with the patient today. Her scan showed no concerning findings for disease progression.    Recurrent Non-Small Cell Lung Cancer (NSCLC) Adenocarcinoma 77 year old with recurrent NSCLC adenocarcinoma diagnosed in July 2022. Undergoing immunotherapy (Imfinzi) every four weeks, completed six cycles. Recent scan shows no new findings, indicating well-managed disease. No current symptoms of fever, chills, nausea, vomiting, or dyspnea. Recent cold symptoms localized to the head, no significant chest congestion. Discussed the importance of continuing treatment and monitoring for new  symptoms. - Continue immunotherapy with Imfinzi every four weeks - Schedule next follow-up in four weeks  Head Cold Recent upper respiratory symptoms suggestive of a head cold, with no fever, chills, or significant chest congestion. Symptoms include head congestion and resolved cough. - Advise on supportive care for head cold symptoms  Facial Injury Recent minor facial injury from an incident involving a tree. No significant  complications reported. - Monitor for signs of infection or complications  General Health Maintenance General health maintenance discussed briefly. - Advise on general health maintenance and wellness  Follow-up - Schedule follow-up appointment in four weeks.   The patient was advised to call immediately if she has any concerning symptoms in the interval. The patient voices understanding of current disease status and treatment options and is in agreement with the current care plan.  All questions were answered. The patient knows to call the clinic with any problems, questions or concerns. We can certainly see the patient much sooner if necessary.  The total time spent in the appointment was 30 minutes.  Disclaimer: This note was dictated with voice recognition software. Similar sounding words can inadvertently be transcribed and may not be corrected upon review.

## 2023-01-20 ENCOUNTER — Other Ambulatory Visit: Payer: Self-pay

## 2023-02-01 ENCOUNTER — Telehealth: Payer: Self-pay | Admitting: Physician Assistant

## 2023-02-01 ENCOUNTER — Encounter: Payer: Self-pay | Admitting: Internal Medicine

## 2023-02-01 NOTE — Telephone Encounter (Signed)
Spoke with patient confirming upcoming appointment  

## 2023-02-04 NOTE — Progress Notes (Signed)
[facility] OFFICE PROGRESS NOTE  Felix Pacini, FNP [address] [place] [address]  DIAGNOSIS:  Recurrent non-small cell lung cancer that was initially diagnosed as stage IB (T2 a, N0, M0) non-small cell lung cancer, adenocarcinoma diagnosed in September 2020. She is status post left upper lobectomy with lymph node dissection with tumor size of 3.2 cm and negative lymphadenopathy at that time. She had disease recurrence in July 2022 with anterior mediastinal lymphadenopathy which was consistent with metastatic non-small cell lung cancer.    Biomarker Findings Tumor Mutational Burden - 21 Muts/Mb Microsatellite status - MS-Stable Genomic Findings For a complete list of the genes assayed, please refer to the Appendix. KRAS G12V TP53 F134L 7 Disease relevant genes with no reportable alterations: ALK, BRAF, EGFR, ERBB2, MET, RET, ROS1   PDL1: 0%  PRIOR THERAPY: 1)  Left upper lobectomy under the care of Dr. Cliffton Asters in November 2020. 2) SBRT to the disease recurrence in the anterior mediastinal lymphadenopathy under the care of Dr. Mitzi Hansen. Last treatment 12/16/20. 3) Concurrent chemoradiation with carboplatin for an AUC of 2 and Taxol 45 mg/m2. First dose expected on 05/08/2022.  Status post 7 cycles with partial response.  CURRENT THERAPY: Consolidation treatment with immunotherapy with Imfinzi 1500 Mg IV every 4 weeks.  First dose Jul 26, 2022.  Status post 7 cycles .   INTERVAL HISTORY: Jennifer [name] 77 y.o. female returns to the clinic for a follow up visit. The patient is feeling well today without any concerning complaints. She had mild allergies/cold a few weeks ago after raking leaves. It was not significant enough to warrant taking any OTC medications. She may have a mild cough. The patient continues to tolerate treatment with immunotherapy with Imfinzi well without any adverse effects. Denies any fever, chills, night sweats, or weight loss. Denies any  chest pain or hemoptysis. She denies dyspnea on exertion. Her dyspnea on exertion is better since starting iron supplements. Denies any nausea, vomiting, diarrhea, or constipation. Denies any headache or visual changes. Denies any rashes or skin changes. The patient is here today for evaluation prior to starting cycle # 8   MEDICAL HISTORY: Past Medical History:  Diagnosis Date   Cancer (HCC)    Lung cancer   History of chicken pox    Hyperlipidemia    Hypertension    Menopause    Osteoporosis    Pneumonia    "walking" pneumonia   Pre-diabetes    Vitamin D deficiency     ALLERGIES:  is allergic to aspirin and latex.  MEDICATIONS:  Current Outpatient Medications  Medication Sig Dispense Refill   atorvastatin (LIPITOR) 40 MG tablet Take 40 mg by mouth every evening.      diltiazem (DILACOR XR) 240 MG 24 hr capsule Take 240 mg by mouth daily.     guaiFENesin (MUCINEX) 600 MG 12 hr tablet Take 1 tablet (600 mg total) by mouth 2 (two) times daily. (Patient taking differently: Take 600 mg by mouth daily.)     metoprolol succinate (TOPROL-XL) 50 MG 24 hr tablet Take 50 mg by mouth daily.     Multiple Vitamin (MULTIVITAMIN WITH MINERALS) TABS tablet Take 1 tablet by mouth daily.     triamterene-hydrochlorothiazide (MAXZIDE-25) 37.5-25 MG tablet Take 1 tablet by mouth daily.     No current facility-administered medications for this visit.    SURGICAL HISTORY:  Past Surgical History:  Procedure Laterality Date   CHEST TUBE INSERTION Left 01/19/2019   Procedure: Chest Tube  Insertion;  Surgeon: Corliss Skains, MD;  Location: [facility] OR;  Service: Thoracic;  Laterality: Left;   PLEURADESIS N/A 01/06/2019   Procedure: Pleuradesis - Chemical;  Surgeon: Corliss Skains, MD;  Location: [facility] OR;  Service: Thoracic;  Laterality: N/A;   PLEURADESIS Left 01/19/2019   Procedure: Mechanical Pleuradesis;  Surgeon: Corliss Skains, MD;  Location: [facility] OR;  Service: Thoracic;  Laterality:  Left;   VIDEO ASSISTED THORACOSCOPY Left 01/19/2019   Procedure: VIDEO ASSISTED THORACOSCOPY;  Surgeon: Corliss Skains, MD;  Location: [facility] OR;  Service: Thoracic;  Laterality: Left;   VIDEO ASSISTED THORACOSCOPY (VATS)/ LOBECTOMY Left 12/29/2018   Procedure: VIDEO ASSISTED THORACOSCOPY (VATS)/LEFT UPPER  LOBECTOMY with Mediastinal lymph node exploration.;  Surgeon: Corliss Skains, MD;  Location: [facility] OR;  Service: Thoracic;  Laterality: Left;   VIDEO BRONCHOSCOPY N/A 12/29/2018   Procedure: VIDEO BRONCHOSCOPY;  Surgeon: Corliss Skains, MD;  Location: [facility] OR;  Service: Thoracic;  Laterality: N/A;   VIDEO BRONCHOSCOPY WITH ENDOBRONCHIAL NAVIGATION N/A 12/09/2018   Procedure: VIDEO BRONCHOSCOPY WITH ENDOBRONCHIAL NAVIGATION;  Surgeon: Corliss Skains, MD;  Location: [facility] OR;  Service: Thoracic;  Laterality: N/A;   VIDEO BRONCHOSCOPY WITH INSERTION OF INTERBRONCHIAL VALVE (IBV) N/A 01/06/2019   Procedure: VIDEO BRONCHOSCOPY WITH INSERTION OF INTERBRONCHIAL VALVE (IBV);  Surgeon: Corliss Skains, MD;  Location: [facility] OR;  Service: Thoracic;  Laterality: N/A;   VIDEO BRONCHOSCOPY WITH INSERTION OF INTERBRONCHIAL VALVE (IBV) N/A 01/13/2019   Procedure: VIDEO BRONCHOSCOPY WITH INSERTION OF INTERBRONCHIAL VALVE (IBV);  Surgeon: Corliss Skains, MD;  Location: [facility] OR;  Service: Thoracic;  Laterality: N/A;   VIDEO BRONCHOSCOPY WITH INSERTION OF INTERBRONCHIAL VALVE (IBV) N/A 02/23/2019   Procedure: VIDEO BRONCHOSCOPY WITH REMOVAL OF INTERBRONCHIAL VALVE (IBV);  Surgeon: Corliss Skains, MD;  Location: [facility] OR;  Service: Thoracic;  Laterality: N/A;    REVIEW OF SYSTEMS:   Review of Systems  Constitutional: Negative for appetite change, chills, fatigue, fever and unexpected weight change.  HENT: Negative for mouth sores, nosebleeds, sore throat and trouble swallowing.   Eyes: Negative for eye problems and icterus.  Respiratory: Positive for mild cough. Negative for hemoptysis,  shortness of breath and wheezing.   Cardiovascular: Negative for chest pain and leg swelling.  Gastrointestinal: Negative for abdominal pain, constipation, diarrhea, nausea and vomiting.  Genitourinary: Negative for bladder incontinence, difficulty urinating, dysuria, frequency and hematuria.   Musculoskeletal: Negative for back pain, gait problem, neck pain and neck stiffness.  Skin: Negative for itching and rash.  Neurological: Negative for dizziness, extremity weakness, gait problem, headaches, light-headedness and seizures.  Hematological: Negative for adenopathy. Does not bruise/bleed easily.  Psychiatric/Behavioral: Negative for confusion, depression and sleep disturbance. The patient is not nervous/anxious.     PHYSICAL EXAMINATION:  Blood pressure (!) 146/89, pulse 100, temperature 97.9 F (36.6 C), temperature source Temporal, resp. rate 16, weight 165 lb 1.6 oz (74.9 kg), SpO2 99%.  ECOG PERFORMANCE STATUS: 1  Physical Exam  Constitutional: Oriented to person, place, and time and well-developed, well-nourished, and in no distress.  HENT:  Head: Normocephalic and atraumatic.  Mouth/Throat: Oropharynx is clear and moist. No oropharyngeal exudate.  Eyes: Conjunctivae are normal. Right eye exhibits no discharge. Left eye exhibits no discharge. No scleral icterus.  Neck: Normal range of motion. Neck supple.  Cardiovascular: Normal rate, regular rhythm, normal heart sounds and intact distal pulses.   Pulmonary/Chest: Effort normal and breath sounds normal. No respiratory distress. No wheezes. No rales.  Abdominal: Soft. Bowel sounds  are normal. Exhibits no distension and no mass. There is no tenderness.  Musculoskeletal: Normal range of motion. Exhibits no edema.  Lymphadenopathy:    No cervical adenopathy.  Neurological: Alert and oriented to person, place, and time. Exhibits normal muscle tone. Gait normal. Coordination normal.  Skin: Skin is warm and dry. No rash noted. Not  diaphoretic. No erythema. No pallor.  Psychiatric: Mood, memory and judgment normal.  Vitals reviewed.  LABORATORY DATA: Lab Results  Component Value Date   WBC 6.9 02/08/2023   HGB 12.5 02/08/2023   HCT 37.9 02/08/2023   MCV 92.9 02/08/2023   PLT 185 02/08/2023      Chemistry      Component Value Date/Time   NA 139 01/10/2023 0923   K 3.5 01/10/2023 0923   CL 101 01/10/2023 0923   CO2 27 01/10/2023 0923   BUN 21 01/10/2023 0923   CREATININE 1.35 (H) 01/10/2023 0923   GLU 845 10/28/2009 0000      Component Value Date/Time   CALCIUM 9.7 01/10/2023 0923   ALKPHOS 119 01/10/2023 0923   AST 19 01/10/2023 0923   ALT 20 01/10/2023 0923   BILITOT 0.6 01/10/2023 0923       RADIOGRAPHIC STUDIES:  No results found.   ASSESSMENT/PLAN:  This is a very pleasant 77 year old Caucasian female with recurrent non-small cell lung cancer that was initially diagnosed with stage Ib (T2 a, N0, M0) non-small cell lung cancer, adenocarcinoma.  She was initially diagnosed in September of 2020.  She was initially diagnosed with a left upper lobe lesion status post left upper lobectomy lymph node dissection with a tumor size of 3.2 and negative lymphadenopathy at that time.  The patient and September 2022 was found to have disease recurrence with biopsy-proven metastatic non-small cell lung cancer in the mediastinal lymph node.  PD-L1 expression is 0%.  She is negative for any actionable mutations.     She completed SBRT to the disease recurrence on 12/16/20 under the care of Dr. Mitzi Hansen.    She had a restaging CT scan that showed enlarging AP window lymph node in January 2024. The PET CT in February 2024 confirmed the AP window lymph node is significantly hypermetabolic.  Therefore, this is compatible with disease recurrence.   She underwent concurrent chemoradiation with weekly carboplatin for AUC of 2 and paclitaxel 45 Mg/M2. First dose May 08, 2022. Status post 7 cycles. Last dose was given on  06/18/2022 with partial response.   She is currently on consolidation immunotherapy with Imfinzi 1500 mg every 4 weeks. She is status post 7 cycles.   Labs were reviewed.  Recommend that she proceed with cycle #8 today as scheduled.  We will see her back for follow-up visit in 4 weeks for evaluation repeat blood work before undergoing cycle #9.  The patient was advised to call immediately if she has any concerning symptoms in the interval. The patient voices understanding of current disease status and treatment options and is in agreement with the current care plan. All questions were answered. The patient knows to call the clinic with any problems, questions or concerns. We can certainly see the patient much sooner if necessary     No orders of the defined types were placed in this encounter.     The total time spent in the appointment was 20-29 minutes  Jennifer Priebe L Norma Montemurro, PA-C 02/08/23

## 2023-02-06 ENCOUNTER — Other Ambulatory Visit: Payer: Self-pay

## 2023-02-07 ENCOUNTER — Ambulatory Visit: Payer: Medicare Other | Admitting: Physician Assistant

## 2023-02-07 ENCOUNTER — Other Ambulatory Visit: Payer: Medicare Other

## 2023-02-07 ENCOUNTER — Ambulatory Visit: Payer: Medicare Other

## 2023-02-08 ENCOUNTER — Inpatient Hospital Stay: Payer: Medicare Other | Attending: Internal Medicine

## 2023-02-08 ENCOUNTER — Inpatient Hospital Stay (HOSPITAL_BASED_OUTPATIENT_CLINIC_OR_DEPARTMENT_OTHER): Payer: Medicare Other | Admitting: Physician Assistant

## 2023-02-08 ENCOUNTER — Inpatient Hospital Stay: Payer: Medicare Other

## 2023-02-08 VITALS — BP 146/89 | HR 100 | Temp 97.9°F | Resp 16 | Wt 165.1 lb

## 2023-02-08 DIAGNOSIS — C3490 Malignant neoplasm of unspecified part of unspecified bronchus or lung: Secondary | ICD-10-CM | POA: Diagnosis not present

## 2023-02-08 DIAGNOSIS — Z5112 Encounter for antineoplastic immunotherapy: Secondary | ICD-10-CM | POA: Insufficient documentation

## 2023-02-08 DIAGNOSIS — C3412 Malignant neoplasm of upper lobe, left bronchus or lung: Secondary | ICD-10-CM | POA: Diagnosis not present

## 2023-02-08 DIAGNOSIS — C771 Secondary and unspecified malignant neoplasm of intrathoracic lymph nodes: Secondary | ICD-10-CM | POA: Insufficient documentation

## 2023-02-08 LAB — CBC WITH DIFFERENTIAL (CANCER CENTER ONLY)
Abs Immature Granulocytes: 0.02 10*3/uL (ref 0.00–0.07)
Basophils Absolute: 0 10*3/uL (ref 0.0–0.1)
Basophils Relative: 1 %
Eosinophils Absolute: 0.2 10*3/uL (ref 0.0–0.5)
Eosinophils Relative: 3 %
HCT: 37.9 % (ref 36.0–46.0)
Hemoglobin: 12.5 g/dL (ref 12.0–15.0)
Immature Granulocytes: 0 %
Lymphocytes Relative: 24 %
Lymphs Abs: 1.7 10*3/uL (ref 0.7–4.0)
MCH: 30.6 pg (ref 26.0–34.0)
MCHC: 33 g/dL (ref 30.0–36.0)
MCV: 92.9 fL (ref 80.0–100.0)
Monocytes Absolute: 0.6 10*3/uL (ref 0.1–1.0)
Monocytes Relative: 8 %
Neutro Abs: 4.4 10*3/uL (ref 1.7–7.7)
Neutrophils Relative %: 64 %
Platelet Count: 185 10*3/uL (ref 150–400)
RBC: 4.08 MIL/uL (ref 3.87–5.11)
RDW: 13.7 % (ref 11.5–15.5)
WBC Count: 6.9 10*3/uL (ref 4.0–10.5)
nRBC: 0 % (ref 0.0–0.2)

## 2023-02-08 LAB — CMP (CANCER CENTER ONLY)
ALT: 19 U/L (ref 0–44)
AST: 22 U/L (ref 15–41)
Albumin: 4.4 g/dL (ref 3.5–5.0)
Alkaline Phosphatase: 122 U/L (ref 38–126)
Anion gap: 9 (ref 5–15)
BUN: 15 mg/dL (ref 8–23)
CO2: 28 mmol/L (ref 22–32)
Calcium: 9.7 mg/dL (ref 8.9–10.3)
Chloride: 103 mmol/L (ref 98–111)
Creatinine: 1.24 mg/dL — ABNORMAL HIGH (ref 0.44–1.00)
GFR, Estimated: 45 mL/min — ABNORMAL LOW (ref >60)
Glucose, Bld: 108 mg/dL — ABNORMAL HIGH (ref 70–99)
Potassium: 3.7 mmol/L (ref 3.5–5.1)
Sodium: 140 mmol/L (ref 135–145)
Total Bilirubin: 0.5 mg/dL (ref <1.2)
Total Protein: 7.4 g/dL (ref 6.5–8.1)

## 2023-02-08 MED ORDER — SODIUM CHLORIDE 0.9 % IV SOLN
1500.0000 mg | Freq: Once | INTRAVENOUS | Status: AC
  Administered 2023-02-08: 1500 mg via INTRAVENOUS
  Filled 2023-02-08: qty 30

## 2023-02-08 MED ORDER — SODIUM CHLORIDE 0.9 % IV SOLN: Freq: Once | INTRAVENOUS | Status: AC

## 2023-02-08 NOTE — Patient Instructions (Signed)
 [facility] - A DEPT OF [facility]  Discharge Instructions: Thank you for choosing [facility] to provide your oncology and hematology care.   If you have a lab appointment with the Cancer Center, please go directly to the Cancer Center and check in at the registration area.   Wear comfortable clothing and clothing appropriate for easy access to any Portacath or PICC line.   We strive to give you quality time with your provider. You may need to reschedule your appointment if you arrive late (15 or more minutes).  Arriving late affects you and other patients whose appointments are after yours.  Also, if you miss three or more appointments without notifying the office, you may be dismissed from the clinic at the provider's discretion.      For prescription refill requests, have your pharmacy contact our office and allow 72 hours for refills to be completed.    Today you received the following chemotherapy and/or immunotherapy agents: Imfinzi      To help prevent nausea and vomiting after your treatment, we encourage you to take your nausea medication as directed.  BELOW ARE SYMPTOMS THAT SHOULD BE REPORTED IMMEDIATELY: *FEVER GREATER THAN 100.4 F (38 C) OR HIGHER *CHILLS OR SWEATING *NAUSEA AND VOMITING THAT IS NOT CONTROLLED WITH YOUR NAUSEA MEDICATION *UNUSUAL SHORTNESS OF BREATH *UNUSUAL BRUISING OR BLEEDING *URINARY PROBLEMS (pain or burning when urinating, or frequent urination) *BOWEL PROBLEMS (unusual diarrhea, constipation, pain near the anus) TENDERNESS IN MOUTH AND THROAT WITH OR WITHOUT PRESENCE OF ULCERS (sore throat, sores in mouth, or a toothache) UNUSUAL RASH, SWELLING OR PAIN  UNUSUAL VAGINAL DISCHARGE OR ITCHING   Items with * indicate a potential emergency and should be followed up as soon as possible or go to the Emergency Department if any problems should occur.  Please show the CHEMOTHERAPY ALERT CARD or IMMUNOTHERAPY  ALERT CARD at check-in to the Emergency Department and triage nurse.  Should you have questions after your visit or need to cancel or reschedule your appointment, please contact [facility] - A DEPT OF [facility]  Dept: [phone]  and follow the prompts.  Office hours are 8:00 a.m. to 4:30 p.m. Monday - Friday. Please note that voicemails left after 4:00 p.m. may not be returned until the following business day.  We are closed weekends and major holidays. You have access to a nurse at all times for urgent questions. Please call the main number to the clinic Dept: [phone] and follow the prompts.   For any non-urgent questions, you may also contact your provider using MyChart. We now offer e-Visits for anyone 80 and older to request care online for non-urgent symptoms. For details visit [email or web address].   Also download the MyChart app! Go to the app store, search "MyChart", open the app, select , and log in with your MyChart username and password.

## 2023-03-01 ENCOUNTER — Other Ambulatory Visit: Payer: Self-pay

## 2023-03-01 ENCOUNTER — Encounter: Payer: Self-pay | Admitting: Internal Medicine

## 2023-03-03 NOTE — Progress Notes (Signed)
 [facility] OFFICE PROGRESS NOTE  Jennifer Leita Ruth, FNP [address] [place] [address]  DIAGNOSIS: Recurrent non-small cell lung cancer that was initially diagnosed as stage IB (T2 a, N0, M0) non-small cell lung cancer, adenocarcinoma diagnosed in September 2020. She is status post left upper lobectomy with lymph node dissection with tumor size of 3.2 cm and negative lymphadenopathy at that time. She had disease recurrence in July 2022 with anterior mediastinal lymphadenopathy which was consistent with metastatic non-small cell lung cancer.    Biomarker Findings Tumor Mutational Burden - 21 Muts/Mb Microsatellite status - MS-Stable Genomic Findings For a complete list of the genes assayed, please refer to the Appendix. KRAS G12V TP53 F134L 7 Disease relevant genes with no reportable alterations: ALK, BRAF, EGFR, ERBB2, MET, RET, ROS1   PDL1: 0%  PRIOR THERAPY: 1)  Left upper lobectomy under the care of Dr. Shyrl in November 2020. 2) SBRT to the disease recurrence in the anterior mediastinal lymphadenopathy under the care of Dr. Dewey. Last treatment 12/16/20. 3) Concurrent chemoradiation with carboplatin  for an AUC of 2 and Taxol  45 mg/m2. First dose expected on 05/08/2022.  Status post 7 cycles with partial response.  CURRENT THERAPY:  Consolidation treatment with immunotherapy with Imfinzi  1500 Mg IV every 4 weeks.  First dose Jul 26, 2022.  Status post 8 cycles .   INTERVAL HISTORY: Jennifer [name] 78 y.o. female returns to the clinic for a follow up visit. The patient is feeling well today without any concerning complaints. The patient continues to tolerate treatment with immunotherapy with Imfinzi  well without any adverse effects. Denies any fever, chills, night sweats, or weight loss. Denies any chest pain or hemoptysis. She denies dyspnea on exertion. She denies cough. Denies any nausea, vomiting, diarrhea, or constipation. Denies any headache or visual  changes. Denies any rashes or skin changes. The patient is here today for evaluation prior to starting cycle # 9    MEDICAL HISTORY: Past Medical History:  Diagnosis Date   Cancer (HCC)    Lung cancer   History of chicken pox    Hyperlipidemia    Hypertension    Menopause    Osteoporosis    Pneumonia    walking pneumonia   Pre-diabetes    Vitamin D deficiency     ALLERGIES:  is allergic to aspirin and latex.  MEDICATIONS:  Current Outpatient Medications  Medication Sig Dispense Refill   atorvastatin  (LIPITOR) 40 MG tablet Take 40 mg by mouth every evening.      guaiFENesin  (MUCINEX ) 600 MG 12 hr tablet Take 1 tablet (600 mg total) by mouth 2 (two) times daily. (Patient taking differently: Take 600 mg by mouth daily.)     metoprolol succinate (TOPROL-XL) 50 MG 24 hr tablet Take 50 mg by mouth daily.     Multiple Vitamin (MULTIVITAMIN WITH MINERALS) TABS tablet Take 1 tablet by mouth daily.     triamterene-hydrochlorothiazide (MAXZIDE-25) 37.5-25 MG tablet Take 1 tablet by mouth daily.     diltiazem (DILACOR XR) 240 MG 24 hr capsule Take 240 mg by mouth daily.     No current facility-administered medications for this visit.   Facility-Administered Medications Ordered in Other Visits  Medication Dose Route Frequency Provider Last Rate Last Admin   0.9 %  sodium chloride  infusion   Intravenous Once Mohamed, Mohamed, MD       durvalumab  (IMFINZI ) 1,500 mg in sodium chloride  0.9 % 100 mL chemo infusion  1,500 mg Intravenous Once Mohamed, Mohamed,  MD        SURGICAL HISTORY:  Past Surgical History:  Procedure Laterality Date   CHEST TUBE INSERTION Left 01/19/2019   Procedure: Chest Tube Insertion;  Surgeon: Shyrl Linnie KIDD, MD;  Location: [facility] OR;  Service: Thoracic;  Laterality: Left;   PLEURADESIS N/A 01/06/2019   Procedure: Pleuradesis - Chemical;  Surgeon: Shyrl Linnie KIDD, MD;  Location: [facility] OR;  Service: Thoracic;  Laterality: N/A;   PLEURADESIS Left 01/19/2019    Procedure: Mechanical Pleuradesis;  Surgeon: Shyrl Linnie KIDD, MD;  Location: [facility] OR;  Service: Thoracic;  Laterality: Left;   VIDEO ASSISTED THORACOSCOPY Left 01/19/2019   Procedure: VIDEO ASSISTED THORACOSCOPY;  Surgeon: Shyrl Linnie KIDD, MD;  Location: [facility] OR;  Service: Thoracic;  Laterality: Left;   VIDEO ASSISTED THORACOSCOPY (VATS)/ LOBECTOMY Left 12/29/2018   Procedure: VIDEO ASSISTED THORACOSCOPY (VATS)/LEFT UPPER  LOBECTOMY with Mediastinal lymph node exploration.;  Surgeon: Shyrl Linnie KIDD, MD;  Location: [facility] OR;  Service: Thoracic;  Laterality: Left;   VIDEO BRONCHOSCOPY N/A 12/29/2018   Procedure: VIDEO BRONCHOSCOPY;  Surgeon: Shyrl Linnie KIDD, MD;  Location: [facility] OR;  Service: Thoracic;  Laterality: N/A;   VIDEO BRONCHOSCOPY WITH ENDOBRONCHIAL NAVIGATION N/A 12/09/2018   Procedure: VIDEO BRONCHOSCOPY WITH ENDOBRONCHIAL NAVIGATION;  Surgeon: Shyrl Linnie KIDD, MD;  Location: [facility] OR;  Service: Thoracic;  Laterality: N/A;   VIDEO BRONCHOSCOPY WITH INSERTION OF INTERBRONCHIAL VALVE (IBV) N/A 01/06/2019   Procedure: VIDEO BRONCHOSCOPY WITH INSERTION OF INTERBRONCHIAL VALVE (IBV);  Surgeon: Shyrl Linnie KIDD, MD;  Location: [facility] OR;  Service: Thoracic;  Laterality: N/A;   VIDEO BRONCHOSCOPY WITH INSERTION OF INTERBRONCHIAL VALVE (IBV) N/A 01/13/2019   Procedure: VIDEO BRONCHOSCOPY WITH INSERTION OF INTERBRONCHIAL VALVE (IBV);  Surgeon: Shyrl Linnie KIDD, MD;  Location: [facility] OR;  Service: Thoracic;  Laterality: N/A;   VIDEO BRONCHOSCOPY WITH INSERTION OF INTERBRONCHIAL VALVE (IBV) N/A 02/23/2019   Procedure: VIDEO BRONCHOSCOPY WITH REMOVAL OF INTERBRONCHIAL VALVE (IBV);  Surgeon: Shyrl Linnie KIDD, MD;  Location: [facility] OR;  Service: Thoracic;  Laterality: N/A;    REVIEW OF SYSTEMS:   Review of Systems  Constitutional: Negative for appetite change, chills, fatigue, fever and unexpected weight change.  HENT:  Negative for mouth sores, nosebleeds, sore throat and trouble  swallowing.   Eyes: Negative for eye problems and icterus.  Respiratory: Negative for cough, hemoptysis, shortness of breath and wheezing.   Cardiovascular: Negative for chest pain and leg swelling.  Gastrointestinal: Negative for abdominal pain, constipation, diarrhea, nausea and vomiting.  Genitourinary: Negative for bladder incontinence, difficulty urinating, dysuria, frequency and hematuria.   Musculoskeletal: Negative for back pain, gait problem, neck pain and neck stiffness.  Skin: Negative for itching and rash.  Neurological: Negative for dizziness, extremity weakness, gait problem, headaches, light-headedness and seizures.  Hematological: Negative for adenopathy. Does not bleed easily. Positive for bruising on her  Psychiatric/Behavioral: Negative for confusion, depression and sleep disturbance. The patient is not nervous/anxious.     PHYSICAL EXAMINATION:  Blood pressure 139/80, pulse 95, temperature 98 F (36.7 C), temperature source Temporal, resp. rate 16, height 5' 9 (1.753 m), weight 168 lb 9.6 oz (76.5 kg), SpO2 98%.  ECOG PERFORMANCE STATUS: 1  Physical Exam  Constitutional: Oriented to person, place, and time and well-developed, well-nourished, and in no distress.   HENT:  Head: Normocephalic and atraumatic.  Mouth/Throat: Oropharynx is clear and moist. No oropharyngeal exudate.  Eyes: Conjunctivae are normal. Right eye exhibits no discharge. Left eye exhibits no discharge. No scleral icterus.  Neck: Normal range  of motion. Neck supple.  Cardiovascular: Normal rate, regular rhythm, normal heart sounds and intact distal pulses.   Pulmonary/Chest: Effort normal and breath sounds normal. No respiratory distress. No wheezes. No rales.  Abdominal: Soft. Bowel sounds are normal. Exhibits no distension and no mass. There is no tenderness.  Musculoskeletal: Normal range of motion. Exhibits no edema.  Lymphadenopathy:    No cervical adenopathy.  Neurological: Alert and  oriented to person, place, and time. Exhibits normal muscle tone. Gait normal. Coordination normal.  Skin: Skin is warm and dry. No rash noted. Not diaphoretic. No erythema. No pallor.  Psychiatric: Mood, memory and judgment normal.  Vitals reviewed.  LABORATORY DATA: Lab Results  Component Value Date   WBC 6.8 03/07/2023   HGB 12.7 03/07/2023   HCT 36.9 03/07/2023   MCV 91.3 03/07/2023   PLT 180 03/07/2023      Chemistry      Component Value Date/Time   NA 140 03/07/2023 1013   K 3.6 03/07/2023 1013   CL 104 03/07/2023 1013   CO2 29 03/07/2023 1013   BUN 20 03/07/2023 1013   CREATININE 1.20 (H) 03/07/2023 1013   GLU 845 10/28/2009 0000      Component Value Date/Time   CALCIUM  9.6 03/07/2023 1013   ALKPHOS 123 03/07/2023 1013   AST 19 03/07/2023 1013   ALT 19 03/07/2023 1013   BILITOT 0.5 03/07/2023 1013       RADIOGRAPHIC STUDIES:  No results found.   ASSESSMENT/PLAN:  This is a very pleasant 78 year old Caucasian female with recurrent non-small cell lung cancer that was initially diagnosed with stage Ib (T2 a, N0, M0) non-small cell lung cancer, adenocarcinoma.  She was initially diagnosed in September of 2020.  She was initially diagnosed with a left upper lobe lesion status post left upper lobectomy lymph node dissection with a tumor size of 3.2 and negative lymphadenopathy at that time.  The patient and September 2022 was found to have disease recurrence with biopsy-proven metastatic non-small cell lung cancer in the mediastinal lymph node.  PD-L1 expression is 0%.  She is negative for any actionable mutations.     She completed SBRT to the disease recurrence on 12/16/20 under the care of Dr. Dewey.   She had a restaging CT scan that showed enlarging AP window lymph node in January 2024. The PET CT in February 2024 confirmed the AP window lymph node is significantly hypermetabolic.  Therefore, this is compatible with disease recurrence.    She underwent concurrent  chemoradiation with weekly carboplatin  for AUC of 2 and paclitaxel  45 Mg/M2. First dose May 08, 2022. Status post 7 cycles. Last dose was given on 06/18/2022 with partial response.   She is currently on consolidation immunotherapy with Imfinzi  1500 mg every 4 weeks. She is status post 8 cycles.    Labs were reviewed.  Recommend that she proceed with cycle #9 today as scheduled.  I will arrange for a restaging CT scan of the chest prior to her next cycle of treatment.   We will see her back for follow-up visit in 4 weeks for evaluation repeat blood work before undergoing cycle #10.  The patient was advised to call immediately if she has any concerning symptoms in the interval. The patient voices understanding of current disease status and treatment options and is in agreement with the current care plan. All questions were answered. The patient knows to call the clinic with any problems, questions or concerns. We can certainly see the patient  much sooner if necessary         Orders Placed This Encounter  Procedures   CT Chest W Contrast    Standing Status:   Future    Expected Date:   03/27/2023    Expiration Date:   03/06/2024    If indicated for the ordered procedure, I authorize the administration of contrast media per Radiology protocol:   Yes    Does the patient have a contrast media/X-ray dye allergy?:   No    Preferred imaging location?:   [facility]    The total time spent in the appointment was 20-29 minutes  Jilleen Essner L Kolbie Clarkston, PA-C 03/07/23

## 2023-03-07 ENCOUNTER — Inpatient Hospital Stay: Payer: Medicare Other

## 2023-03-07 ENCOUNTER — Inpatient Hospital Stay: Payer: Medicare Other | Attending: Internal Medicine

## 2023-03-07 ENCOUNTER — Inpatient Hospital Stay (HOSPITAL_BASED_OUTPATIENT_CLINIC_OR_DEPARTMENT_OTHER): Payer: Medicare Other | Admitting: Physician Assistant

## 2023-03-07 VITALS — BP 139/80 | HR 95 | Temp 98.0°F | Resp 16 | Ht 69.0 in | Wt 168.6 lb

## 2023-03-07 DIAGNOSIS — C3490 Malignant neoplasm of unspecified part of unspecified bronchus or lung: Secondary | ICD-10-CM

## 2023-03-07 DIAGNOSIS — C771 Secondary and unspecified malignant neoplasm of intrathoracic lymph nodes: Secondary | ICD-10-CM | POA: Insufficient documentation

## 2023-03-07 DIAGNOSIS — Z5112 Encounter for antineoplastic immunotherapy: Secondary | ICD-10-CM | POA: Insufficient documentation

## 2023-03-07 DIAGNOSIS — Z79620 Long term (current) use of immunosuppressive biologic: Secondary | ICD-10-CM | POA: Diagnosis not present

## 2023-03-07 DIAGNOSIS — C3412 Malignant neoplasm of upper lobe, left bronchus or lung: Secondary | ICD-10-CM | POA: Insufficient documentation

## 2023-03-07 LAB — CBC WITH DIFFERENTIAL (CANCER CENTER ONLY)
Abs Immature Granulocytes: 0.02 10*3/uL (ref 0.00–0.07)
Basophils Absolute: 0 10*3/uL (ref 0.0–0.1)
Basophils Relative: 1 %
Eosinophils Absolute: 0.2 10*3/uL (ref 0.0–0.5)
Eosinophils Relative: 2 %
HCT: 36.9 % (ref 36.0–46.0)
Hemoglobin: 12.7 g/dL (ref 12.0–15.0)
Immature Granulocytes: 0 %
Lymphocytes Relative: 22 %
Lymphs Abs: 1.5 10*3/uL (ref 0.7–4.0)
MCH: 31.4 pg (ref 26.0–34.0)
MCHC: 34.4 g/dL (ref 30.0–36.0)
MCV: 91.3 fL (ref 80.0–100.0)
Monocytes Absolute: 0.5 10*3/uL (ref 0.1–1.0)
Monocytes Relative: 7 %
Neutro Abs: 4.6 10*3/uL (ref 1.7–7.7)
Neutrophils Relative %: 68 %
Platelet Count: 180 10*3/uL (ref 150–400)
RBC: 4.04 MIL/uL (ref 3.87–5.11)
RDW: 13.3 % (ref 11.5–15.5)
WBC Count: 6.8 10*3/uL (ref 4.0–10.5)
nRBC: 0 % (ref 0.0–0.2)

## 2023-03-07 LAB — CMP (CANCER CENTER ONLY)
ALT: 19 U/L (ref 0–44)
AST: 19 U/L (ref 15–41)
Albumin: 4.4 g/dL (ref 3.5–5.0)
Alkaline Phosphatase: 123 U/L (ref 38–126)
Anion gap: 7 (ref 5–15)
BUN: 20 mg/dL (ref 8–23)
CO2: 29 mmol/L (ref 22–32)
Calcium: 9.6 mg/dL (ref 8.9–10.3)
Chloride: 104 mmol/L (ref 98–111)
Creatinine: 1.2 mg/dL — ABNORMAL HIGH (ref 0.44–1.00)
GFR, Estimated: 47 mL/min — ABNORMAL LOW (ref >60)
Glucose, Bld: 118 mg/dL — ABNORMAL HIGH (ref 70–99)
Potassium: 3.6 mmol/L (ref 3.5–5.1)
Sodium: 140 mmol/L (ref 135–145)
Total Bilirubin: 0.5 mg/dL (ref 0.0–1.2)
Total Protein: 7.1 g/dL (ref 6.5–8.1)

## 2023-03-07 LAB — TSH: TSH: 1.095 u[IU]/mL (ref 0.350–4.500)

## 2023-03-07 MED ORDER — SODIUM CHLORIDE 0.9 % IV SOLN: Freq: Once | INTRAVENOUS | Status: AC

## 2023-03-07 MED ORDER — SODIUM CHLORIDE 0.9 % IV SOLN
1500.0000 mg | Freq: Once | INTRAVENOUS | Status: AC
  Administered 2023-03-07: 1500 mg via INTRAVENOUS
  Filled 2023-03-07: qty 30

## 2023-03-09 ENCOUNTER — Other Ambulatory Visit: Payer: Self-pay

## 2023-03-09 LAB — T4: T4, Total: 7.1 ug/dL (ref 4.5–12.0)

## 2023-03-13 ENCOUNTER — Other Ambulatory Visit: Payer: Self-pay

## 2023-03-22 ENCOUNTER — Encounter: Payer: Self-pay | Admitting: Internal Medicine

## 2023-03-25 ENCOUNTER — Ambulatory Visit (HOSPITAL_COMMUNITY)
Admission: RE | Admit: 2023-03-25 | Discharge: 2023-03-25 | Disposition: A | Discharge status: Home / Self Care | Payer: Medicare Other | Source: Ambulatory Visit | Attending: Physician Assistant | Admitting: Physician Assistant

## 2023-03-25 ENCOUNTER — Encounter (HOSPITAL_COMMUNITY): Payer: Self-pay

## 2023-03-25 DIAGNOSIS — C3490 Malignant neoplasm of unspecified part of unspecified bronchus or lung: Secondary | ICD-10-CM | POA: Diagnosis present

## 2023-03-25 MED ORDER — IOHEXOL 300 MG/ML  SOLN
75.0000 mL | Freq: Once | INTRAMUSCULAR | Status: AC | PRN
  Administered 2023-03-25: 60 mL via INTRAVENOUS

## 2023-03-28 ENCOUNTER — Other Ambulatory Visit: Payer: Self-pay

## 2023-04-04 ENCOUNTER — Inpatient Hospital Stay (HOSPITAL_BASED_OUTPATIENT_CLINIC_OR_DEPARTMENT_OTHER): Payer: Medicare Other | Admitting: Internal Medicine

## 2023-04-04 ENCOUNTER — Inpatient Hospital Stay: Payer: Medicare Other

## 2023-04-04 ENCOUNTER — Inpatient Hospital Stay: Payer: Medicare Other | Attending: Internal Medicine

## 2023-04-04 VITALS — BP 124/62 | HR 92 | Temp 97.7°F | Resp 16 | Ht 69.0 in | Wt 170.8 lb

## 2023-04-04 DIAGNOSIS — C3490 Malignant neoplasm of unspecified part of unspecified bronchus or lung: Secondary | ICD-10-CM

## 2023-04-04 DIAGNOSIS — C3412 Malignant neoplasm of upper lobe, left bronchus or lung: Secondary | ICD-10-CM | POA: Insufficient documentation

## 2023-04-04 DIAGNOSIS — Z5112 Encounter for antineoplastic immunotherapy: Secondary | ICD-10-CM | POA: Diagnosis present

## 2023-04-04 DIAGNOSIS — C771 Secondary and unspecified malignant neoplasm of intrathoracic lymph nodes: Secondary | ICD-10-CM | POA: Diagnosis not present

## 2023-04-04 LAB — CBC WITH DIFFERENTIAL (CANCER CENTER ONLY)
Abs Immature Granulocytes: 0.02 10*3/uL (ref 0.00–0.07)
Basophils Absolute: 0.1 10*3/uL (ref 0.0–0.1)
Basophils Relative: 1 %
Eosinophils Absolute: 0.1 10*3/uL (ref 0.0–0.5)
Eosinophils Relative: 1 %
HCT: 38.7 % (ref 36.0–46.0)
Hemoglobin: 12.9 g/dL (ref 12.0–15.0)
Immature Granulocytes: 0 %
Lymphocytes Relative: 19 %
Lymphs Abs: 1.4 10*3/uL (ref 0.7–4.0)
MCH: 30.5 pg (ref 26.0–34.0)
MCHC: 33.3 g/dL (ref 30.0–36.0)
MCV: 91.5 fL (ref 80.0–100.0)
Monocytes Absolute: 0.5 10*3/uL (ref 0.1–1.0)
Monocytes Relative: 7 %
Neutro Abs: 5.3 10*3/uL (ref 1.7–7.7)
Neutrophils Relative %: 72 %
Platelet Count: 198 10*3/uL (ref 150–400)
RBC: 4.23 MIL/uL (ref 3.87–5.11)
RDW: 13.1 % (ref 11.5–15.5)
WBC Count: 7.4 10*3/uL (ref 4.0–10.5)
nRBC: 0 % (ref 0.0–0.2)

## 2023-04-04 LAB — CMP (CANCER CENTER ONLY)
ALT: 17 U/L (ref 0–44)
AST: 19 U/L (ref 15–41)
Albumin: 4.4 g/dL (ref 3.5–5.0)
Alkaline Phosphatase: 124 U/L (ref 38–126)
Anion gap: 8 (ref 5–15)
BUN: 17 mg/dL (ref 8–23)
CO2: 29 mmol/L (ref 22–32)
Calcium: 9.4 mg/dL (ref 8.9–10.3)
Chloride: 102 mmol/L (ref 98–111)
Creatinine: 1.26 mg/dL — ABNORMAL HIGH (ref 0.44–1.00)
GFR, Estimated: 44 mL/min — ABNORMAL LOW (ref >60)
Glucose, Bld: 119 mg/dL — ABNORMAL HIGH (ref 70–99)
Potassium: 3.4 mmol/L — ABNORMAL LOW (ref 3.5–5.1)
Sodium: 139 mmol/L (ref 135–145)
Total Bilirubin: 0.5 mg/dL (ref 0.0–1.2)
Total Protein: 7.4 g/dL (ref 6.5–8.1)

## 2023-04-04 MED ORDER — SODIUM CHLORIDE 0.9 % IV SOLN: Freq: Once | INTRAVENOUS | Status: AC

## 2023-04-04 MED ORDER — SODIUM CHLORIDE 0.9 % IV SOLN
1500.0000 mg | Freq: Once | INTRAVENOUS | Status: AC
  Administered 2023-04-04: 1500 mg via INTRAVENOUS
  Filled 2023-04-04: qty 30

## 2023-04-04 NOTE — Patient Instructions (Signed)
 [facility] - A DEPT OF [facility]  Discharge Instructions: Thank you for choosing [facility] to provide your oncology and hematology care.   If you have a lab appointment with the Cancer Center, please go directly to the Cancer Center and check in at the registration area.   Wear comfortable clothing and clothing appropriate for easy access to any Portacath or PICC line.   We strive to give you quality time with your provider. You may need to reschedule your appointment if you arrive late (15 or more minutes).  Arriving late affects you and other patients whose appointments are after yours.  Also, if you miss three or more appointments without notifying the office, you may be dismissed from the clinic at the provider's discretion.      For prescription refill requests, have your pharmacy contact our office and allow 72 hours for refills to be completed.    Today you received the following chemotherapy and/or immunotherapy agents: durvalumab       To help prevent nausea and vomiting after your treatment, we encourage you to take your nausea medication as directed.  BELOW ARE SYMPTOMS THAT SHOULD BE REPORTED IMMEDIATELY: *FEVER GREATER THAN 100.4 F (38 C) OR HIGHER *CHILLS OR SWEATING *NAUSEA AND VOMITING THAT IS NOT CONTROLLED WITH YOUR NAUSEA MEDICATION *UNUSUAL SHORTNESS OF BREATH *UNUSUAL BRUISING OR BLEEDING *URINARY PROBLEMS (pain or burning when urinating, or frequent urination) *BOWEL PROBLEMS (unusual diarrhea, constipation, pain near the anus) TENDERNESS IN MOUTH AND THROAT WITH OR WITHOUT PRESENCE OF ULCERS (sore throat, sores in mouth, or a toothache) UNUSUAL RASH, SWELLING OR PAIN  UNUSUAL VAGINAL DISCHARGE OR ITCHING   Items with * indicate a potential emergency and should be followed up as soon as possible or go to the Emergency Department if any problems should occur.  Please show the CHEMOTHERAPY ALERT CARD or IMMUNOTHERAPY  ALERT CARD at check-in to the Emergency Department and triage nurse.  Should you have questions after your visit or need to cancel or reschedule your appointment, please contact [facility] - A DEPT OF [facility]  Dept: [phone]  and follow the prompts.  Office hours are 8:00 a.m. to 4:30 p.m. Monday - Friday. Please note that voicemails left after 4:00 p.m. may not be returned until the following business day.  We are closed weekends and major holidays. You have access to a nurse at all times for urgent questions. Please call the main number to the clinic Dept: [phone] and follow the prompts.   For any non-urgent questions, you may also contact your provider using MyChart. We now offer e-Visits for anyone 26 and older to request care online for non-urgent symptoms. For details visit [email or web address].   Also download the MyChart app! Go to the app store, search MyChart, open the app, select [place], and log in with your MyChart username and password.

## 2023-04-04 NOTE — Progress Notes (Signed)
 [facility] [phone]   Fax:([phone]  OFFICE PROGRESS NOTE  Jennifer Leita Ruth, FNP [address] [place] [address]  DIAGNOSIS: Recurrent non-small cell lung cancer that was initially diagnosed as stage IB (T2 a, N0, M0) non-small cell lung cancer, adenocarcinoma diagnosed in September 2020. She is status post left upper lobectomy with lymph node dissection with tumor size of 3.2 cm and negative lymphadenopathy at that time. She had disease recurrence in July 2022 with anterior mediastinal lymphadenopathy which was consistent with metastatic non-small cell lung cancer.    Biomarker Findings Tumor Mutational Burden - 21 Muts/Mb Microsatellite status - MS-Stable Genomic Findings For a complete list of the genes assayed, please refer to the Appendix. KRAS G12V TP53 F134L 7 Disease relevant genes with no reportable alterations: ALK, BRAF, EGFR, ERBB2, MET, RET, ROS1   PDL1: 0%   PRIOR THERAPY: 1)  Left upper lobectomy under the care of Dr. Shyrl in November 2020. 2) SBRT to the disease recurrence in the anterior mediastinal lymphadenopathy under the care of Dr. Dewey. Last treatment 12/16/20. 3) Concurrent chemoradiation with carboplatin  for an AUC of 2 and Taxol  45 mg/m2. First dose expected on 05/08/2022.  Status post 7 cycles with partial response.  CURRENT THERAPY: Consolidation treatment with immunotherapy with Imfinzi  1500 Mg IV every 4 weeks.  First dose Jul 26, 2022.  Status post 9 cycles .  INTERVAL HISTORY: Jennifer [name] 78 y.o. [name] returns to the clinic today for follow-up visit. Discussed the use of AI scribe software for clinical note transcription with the patient, who gave verbal consent to proceed.  History of Present Illness   Jennifer [name] is a 78 year old [name] with stage 1B non-small cell lung cancer who presents for follow-up of her treatment.  Initially diagnosed with stage 1B non-small cell lung cancer in  September 2020, she underwent surgical resection of the left upper lobe as the primary treatment.  In July 2022, she experienced a recurrence of the disease and began concurrent chemoradiation therapy. Currently, she is on consolidation immunotherapy with Imfinzi , receiving treatments every four weeks. She has completed nine cycles and is scheduled for her tenth cycle today.  No new complaints or changes in her condition since the last visit. No chest pain, shortness of breath, cough, nausea, vomiting, or diarrhea. She notes a slight weight gain of one pound. A recent scan showed no new growths, and her lab work is reported to be fine.  She wants to travel to see her sister and son, who lives in [place] , once the weather is warmer. Her sister, who resides in [place], was scheduled for hip surgery but it was postponed due to a sore on her ankle.       MEDICAL HISTORY: Past Medical History:  Diagnosis Date   Cancer (HCC)    Lung cancer   History of chicken pox    Hyperlipidemia    Hypertension    Menopause    Osteoporosis    Pneumonia    walking pneumonia   Pre-diabetes    Vitamin D deficiency     ALLERGIES:  is allergic to aspirin and latex.  MEDICATIONS:  Current Outpatient Medications  Medication Sig Dispense Refill   atorvastatin  (LIPITOR) 40 MG tablet Take 40 mg by mouth every evening.      diltiazem (DILACOR XR) 240 MG 24 hr capsule Take 240 mg by mouth daily.     guaiFENesin  (MUCINEX ) 600 MG 12 hr tablet Take  1 tablet (600 mg total) by mouth 2 (two) times daily. (Patient taking differently: Take 600 mg by mouth daily.)     metoprolol succinate (TOPROL-XL) 50 MG 24 hr tablet Take 50 mg by mouth daily.     Multiple Vitamin (MULTIVITAMIN WITH MINERALS) TABS tablet Take 1 tablet by mouth daily.     triamterene-hydrochlorothiazide (MAXZIDE-25) 37.5-25 MG tablet Take 1 tablet by mouth daily.     No current facility-administered medications for this visit.    SURGICAL HISTORY:   Past Surgical History:  Procedure Laterality Date   CHEST TUBE INSERTION Left 01/19/2019   Procedure: Chest Tube Insertion;  Surgeon: Shyrl Linnie KIDD, MD;  Location: [facility] OR;  Service: Thoracic;  Laterality: Left;   PLEURADESIS N/A 01/06/2019   Procedure: Pleuradesis - Chemical;  Surgeon: Shyrl Linnie KIDD, MD;  Location: [facility] OR;  Service: Thoracic;  Laterality: N/A;   PLEURADESIS Left 01/19/2019   Procedure: Mechanical Pleuradesis;  Surgeon: Shyrl Linnie KIDD, MD;  Location: [facility] OR;  Service: Thoracic;  Laterality: Left;   VIDEO ASSISTED THORACOSCOPY Left 01/19/2019   Procedure: VIDEO ASSISTED THORACOSCOPY;  Surgeon: Shyrl Linnie KIDD, MD;  Location: [facility] OR;  Service: Thoracic;  Laterality: Left;   VIDEO ASSISTED THORACOSCOPY (VATS)/ LOBECTOMY Left 12/29/2018   Procedure: VIDEO ASSISTED THORACOSCOPY (VATS)/LEFT UPPER  LOBECTOMY with Mediastinal lymph node exploration.;  Surgeon: Shyrl Linnie KIDD, MD;  Location: [facility];  Service: Thoracic;  Laterality: Left;   VIDEO BRONCHOSCOPY N/A 12/29/2018   Procedure: VIDEO BRONCHOSCOPY;  Surgeon: Shyrl Linnie KIDD, MD;  Location: [facility];  Service: Thoracic;  Laterality: N/A;   VIDEO BRONCHOSCOPY WITH ENDOBRONCHIAL NAVIGATION N/A 12/09/2018   Procedure: VIDEO BRONCHOSCOPY WITH ENDOBRONCHIAL NAVIGATION;  Surgeon: Shyrl Linnie KIDD, MD;  Location: [facility] OR;  Service: Thoracic;  Laterality: N/A;   VIDEO BRONCHOSCOPY WITH INSERTION OF INTERBRONCHIAL VALVE (IBV) N/A 01/06/2019   Procedure: VIDEO BRONCHOSCOPY WITH INSERTION OF INTERBRONCHIAL VALVE (IBV);  Surgeon: Shyrl Linnie KIDD, MD;  Location: [facility] OR;  Service: Thoracic;  Laterality: N/A;   VIDEO BRONCHOSCOPY WITH INSERTION OF INTERBRONCHIAL VALVE (IBV) N/A 01/13/2019   Procedure: VIDEO BRONCHOSCOPY WITH INSERTION OF INTERBRONCHIAL VALVE (IBV);  Surgeon: Shyrl Linnie KIDD, MD;  Location: [facility] OR;  Service: Thoracic;  Laterality: N/A;   VIDEO BRONCHOSCOPY WITH INSERTION OF INTERBRONCHIAL VALVE  (IBV) N/A 02/23/2019   Procedure: VIDEO BRONCHOSCOPY WITH REMOVAL OF INTERBRONCHIAL VALVE (IBV);  Surgeon: Shyrl Linnie KIDD, MD;  Location: [facility] OR;  Service: Thoracic;  Laterality: N/A;    REVIEW OF SYSTEMS:  Constitutional: negative Eyes: negative Ears, nose, mouth, throat, and face: negative Respiratory: negative Cardiovascular: negative Gastrointestinal: negative Genitourinary:negative Integument/breast: negative Hematologic/lymphatic: negative Musculoskeletal:negative Neurological: negative Behavioral/Psych: positive for anxiety Endocrine: negative Allergic/Immunologic: negative   PHYSICAL EXAMINATION: General appearance: alert, cooperative, and no distress Head: Normocephalic, without obvious abnormality, atraumatic Neck: no adenopathy, no JVD, supple, symmetrical, trachea midline, and thyroid  not enlarged, symmetric, no tenderness/mass/nodules Lymph nodes: Cervical, supraclavicular, and axillary nodes normal. Resp: clear to auscultation bilaterally Back: symmetric, no curvature. ROM normal. No CVA tenderness. Cardio: regular rate and rhythm, S1, S2 normal, no murmur, click, rub or gallop GI: soft, non-tender; bowel sounds normal; no masses,  no organomegaly Extremities: extremities normal, atraumatic, no cyanosis or edema Neurologic: Alert and oriented X 3, normal strength and tone. Normal symmetric reflexes. Normal coordination and gait  ECOG PERFORMANCE STATUS: 1 - Symptomatic but completely ambulatory  Blood pressure 124/62, pulse 92, temperature 97.7 F (36.5 C), resp. rate 16, height 5' 9 (1.753 m), weight 170  lb 12.8 oz (77.5 kg), SpO2 100%.  LABORATORY DATA: Lab Results  Component Value Date   WBC 7.4 04/04/2023   HGB 12.9 04/04/2023   HCT 38.7 04/04/2023   MCV 91.5 04/04/2023   PLT 198 04/04/2023      Chemistry      Component Value Date/Time   NA 140 03/07/2023 1013   K 3.6 03/07/2023 1013   CL 104 03/07/2023 1013   CO2 29 03/07/2023 1013   BUN 20  03/07/2023 1013   CREATININE 1.20 (H) 03/07/2023 1013   GLU 845 10/28/2009 0000      Component Value Date/Time   CALCIUM  9.6 03/07/2023 1013   ALKPHOS 123 03/07/2023 1013   AST 19 03/07/2023 1013   ALT 19 03/07/2023 1013   BILITOT 0.5 03/07/2023 1013       RADIOGRAPHIC STUDIES: CT Chest W Contrast Result Date: 03/31/2023 CLINICAL DATA:  Non-small cell lung cancer (NSCLC), non-metastatic, assess treatment response, immunotherapy in progress. * Tracking Code: BO * EXAM: CT CHEST WITH CONTRAST TECHNIQUE: Multidetector CT imaging of the chest was performed during intravenous contrast administration. RADIATION DOSE REDUCTION: This exam was performed according to the departmental dose-optimization program which includes automated exposure control, adjustment of the mA and/or kV according to patient size and/or use of iterative reconstruction technique. CONTRAST:  60mL OMNIPAQUE  IOHEXOL  300 MG/ML  SOLN COMPARISON:  01/03/2023 chest CT FINDINGS: Cardiovascular: Normal heart size. No significant pericardial effusion/thickening. Left anterior descending coronary atherosclerosis. Atherosclerotic nonaneurysmal thoracic aorta. Normal caliber pulmonary arteries. No central pulmonary emboli. Mediastinum/Nodes: No significant thyroid  nodules. Unremarkable esophagus. No axillary adenopathy. Mildly enlarged 1.1 cm subcarinal node (series 2/image 77), previously 1.0 cm, not substantially changed. No new pathologically enlarged mediastinal nodes. No hilar adenopathy. Lungs/Pleura: No pneumothorax. No pleural effusion. Status post left upper lobectomy. Mild centrilobular emphysema. Tiny 0.3 cm solid posterior right lower lobe pulmonary nodule on series 8/image 115 is stable. No acute consolidative airspace disease, lung masses or new significant pulmonary nodules. Upper abdomen: No acute abnormality. Musculoskeletal: No aggressive appearing focal osseous lesions. Moderate thoracic spondylosis. IMPRESSION: 1. No evidence  of new or progressive metastatic disease in the chest. 2. Mild subcarinal adenopathy is not substantially changed from 01/03/2023 CT and warrants continued close chest CT surveillance. 3. One vessel coronary atherosclerosis. 4. Aortic Atherosclerosis (ICD10-I70.0) and Emphysema (ICD10-J43.9). Electronically Signed   By: Selinda DELENA Blue M.D.   On: 03/31/2023 23:06    ASSESSMENT AND PLAN: This is a very pleasant 78 years old white [name] with recurrent non-small cell lung cancer that was initially diagnosed as stage Ib (T2 a, N0, M0) adenocarcinoma diagnosed in September 2020 status post left upper lobectomy with lymph node dissection with disease recurrence in July 2022 presenting with anterior mediastinal lymphadenopathy.  The patient is status post curative radiotherapy to this area under the care of Dr. Dewey completed December 16, 2020.   She was found to have another evidence of disease recurrence in the AP window lymph nodes in February 2024.  She has brain MRI that showed no evidence of metastatic disease to the brain. She underwent concurrent chemoradiation with weekly carboplatin  for AUC of 2 and paclitaxel  45 Mg/M2.  First dose May 08, 2022.  Status post 7 cycles.  Last dose was given on 06/18/2022 with partial response. The patient tolerated her previous treatment fairly well except for fatigue and odynophagia. She also has mild alopecia. She is currently undergoing consolidation treatment with immunotherapy with Imfinzi  1500 Mg IV every 4 weeks.  She  started the first dose on Jul 26, 2022.  Status post 9 cycles.   The patient has been tolerating this treatment fairly well with no concerning adverse effects. She had repeat CT scan of the chest performed recently.  I personally and independently reviewed the scan and discussed the result with the patient today.  Her scan showed no concerning findings for disease progression.    Stage 1B Non-Small Cell Lung Cancer (NSCLC) with Recurrence Jennifer [name], a 78 year old [name], was initially diagnosed with stage 1B NSCLC in September 2020 and underwent left upper lobe resection. She experienced recurrence in July 2022 and commenced concurrent chemoradiation. Currently, she is on consolidation immunotherapy with Imfinzi  (durvalumab ) every four weeks, having completed nine cycles. Recent imaging shows no disease progression, and lab results are normal. She reports no new symptoms, weight loss, or adverse effects. Discussed completing three more cycles of Imfinzi  to achieve one year of treatment, aiming to maintain disease control. - Administer cycle ten of Imfinzi  today - Continue Imfinzi  every four weeks - Schedule follow-up in four weeks - Plan for three more cycles of Imfinzi  to complete one year of treatment  General Health Maintenance Jennifer [name] is in good general health with no new complaints. She plans to travel to see her family once the weather improves. - Advise on maintaining health for travel  Follow-up - Follow-up in four weeks for next cycle of immunotherapy - Provide a copy of recent scan results to [place].   She was advised to call immediately if she has any concerning symptoms in the interval. The patient voices understanding of current disease status and treatment options and is in agreement with the current care plan.  All questions were answered. The patient knows to call the clinic with any problems, questions or concerns. We can certainly see the patient much sooner if necessary.  The total time spent in the appointment was 30 minutes.  Disclaimer: This note was dictated with voice recognition software. Similar sounding words can inadvertently be transcribed and may not be corrected upon review.

## 2023-05-02 ENCOUNTER — Inpatient Hospital Stay: Payer: Medicare Other | Attending: Internal Medicine

## 2023-05-02 ENCOUNTER — Inpatient Hospital Stay: Payer: Medicare Other

## 2023-05-02 ENCOUNTER — Inpatient Hospital Stay: Payer: Medicare Other | Admitting: Internal Medicine

## 2023-05-02 VITALS — BP 143/70 | HR 67 | Temp 98.1°F | Resp 16

## 2023-05-02 VITALS — BP 127/85 | HR 95 | Temp 97.9°F | Resp 16 | Ht 69.0 in | Wt 170.9 lb

## 2023-05-02 DIAGNOSIS — C771 Secondary and unspecified malignant neoplasm of intrathoracic lymph nodes: Secondary | ICD-10-CM | POA: Insufficient documentation

## 2023-05-02 DIAGNOSIS — Z5112 Encounter for antineoplastic immunotherapy: Secondary | ICD-10-CM | POA: Insufficient documentation

## 2023-05-02 DIAGNOSIS — L659 Nonscarring hair loss, unspecified: Secondary | ICD-10-CM | POA: Insufficient documentation

## 2023-05-02 DIAGNOSIS — C3490 Malignant neoplasm of unspecified part of unspecified bronchus or lung: Secondary | ICD-10-CM

## 2023-05-02 DIAGNOSIS — C3412 Malignant neoplasm of upper lobe, left bronchus or lung: Secondary | ICD-10-CM | POA: Diagnosis not present

## 2023-05-02 LAB — CMP (CANCER CENTER ONLY)
ALT: 17 U/L (ref 0–44)
AST: 20 U/L (ref 15–41)
Albumin: 4.6 g/dL (ref 3.5–5.0)
Alkaline Phosphatase: 128 U/L — ABNORMAL HIGH (ref 38–126)
Anion gap: 10 (ref 5–15)
BUN: 21 mg/dL (ref 8–23)
CO2: 28 mmol/L (ref 22–32)
Calcium: 9.4 mg/dL (ref 8.9–10.3)
Chloride: 100 mmol/L (ref 98–111)
Creatinine: 1.27 mg/dL — ABNORMAL HIGH (ref 0.44–1.00)
GFR, Estimated: 44 mL/min — ABNORMAL LOW (ref >60)
Glucose, Bld: 126 mg/dL — ABNORMAL HIGH (ref 70–99)
Potassium: 3.4 mmol/L — ABNORMAL LOW (ref 3.5–5.1)
Sodium: 138 mmol/L (ref 135–145)
Total Bilirubin: 0.6 mg/dL (ref 0.0–1.2)
Total Protein: 7.5 g/dL (ref 6.5–8.1)

## 2023-05-02 LAB — CBC WITH DIFFERENTIAL (CANCER CENTER ONLY)
Abs Immature Granulocytes: 0.02 10*3/uL (ref 0.00–0.07)
Basophils Absolute: 0.1 10*3/uL (ref 0.0–0.1)
Basophils Relative: 1 %
Eosinophils Absolute: 0.1 10*3/uL (ref 0.0–0.5)
Eosinophils Relative: 1 %
HCT: 39.8 % (ref 36.0–46.0)
Hemoglobin: 13.4 g/dL (ref 12.0–15.0)
Immature Granulocytes: 0 %
Lymphocytes Relative: 25 %
Lymphs Abs: 1.7 10*3/uL (ref 0.7–4.0)
MCH: 30.6 pg (ref 26.0–34.0)
MCHC: 33.7 g/dL (ref 30.0–36.0)
MCV: 90.9 fL (ref 80.0–100.0)
Monocytes Absolute: 0.5 10*3/uL (ref 0.1–1.0)
Monocytes Relative: 7 %
Neutro Abs: 4.6 10*3/uL (ref 1.7–7.7)
Neutrophils Relative %: 66 %
Platelet Count: 198 10*3/uL (ref 150–400)
RBC: 4.38 MIL/uL (ref 3.87–5.11)
RDW: 12.6 % (ref 11.5–15.5)
WBC Count: 7 10*3/uL (ref 4.0–10.5)
nRBC: 0 % (ref 0.0–0.2)

## 2023-05-02 MED ORDER — SODIUM CHLORIDE 0.9 % IV SOLN: Freq: Once | INTRAVENOUS | Status: AC

## 2023-05-02 MED ORDER — SODIUM CHLORIDE 0.9 % IV SOLN
1500.0000 mg | Freq: Once | INTRAVENOUS | Status: AC
  Administered 2023-05-02: 1500 mg via INTRAVENOUS
  Filled 2023-05-02: qty 30

## 2023-05-02 NOTE — Progress Notes (Signed)
 [facility] [phone]   Fax:(336) [phone]  OFFICE PROGRESS NOTE  Jennifer Pacini, FNP [address] [place] [address]  DIAGNOSIS: Recurrent non-small cell lung cancer that was initially diagnosed as stage IB (T2 a, N0, M0) non-small cell lung cancer, adenocarcinoma diagnosed in September 2020. She is status post left upper lobectomy with lymph node dissection with tumor size of 3.2 cm and negative lymphadenopathy at that time. She had disease recurrence in July 2022 with anterior mediastinal lymphadenopathy which was consistent with metastatic non-small cell lung cancer.    Biomarker Findings Tumor Mutational Burden - 21 Muts/Mb Microsatellite status - MS-Stable Genomic Findings For a complete list of the genes assayed, please refer to the Appendix. KRAS G12V TP53 F134L 7 Disease relevant genes with no reportable alterations: ALK, BRAF, EGFR, ERBB2, MET, RET, ROS1   PDL1: 0%   PRIOR THERAPY: 1)  Left upper lobectomy under the care of Dr. Cliffton Asters in November 2020. 2) SBRT to the disease recurrence in the anterior mediastinal lymphadenopathy under the care of Dr. Mitzi Hansen. Last treatment 12/16/20. 3) Concurrent chemoradiation with carboplatin for an AUC of 2 and Taxol 45 mg/m2. First dose expected on 05/08/2022.  Status post 7 cycles with partial response.  CURRENT THERAPY: Consolidation treatment with immunotherapy with Imfinzi 1500 Mg IV every 4 weeks.  First dose Jul 26, 2022.  Status post 10 cycles .  INTERVAL HISTORY: Jennifer [name] 78 y.o. [name] returns to the clinic [name] for follow-up visit. Discussed the use of AI scribe software for clinical note transcription with the patient, who gave verbal consent to proceed.  History of Present Illness   Jennifer [name] is a 78 year old [name] with recurrent non-small cell lung cancer adenocarcinoma who presents for follow-up of her treatment.  She was diagnosed with recurrent non-small cell  lung cancer adenocarcinoma in July 2022. She underwent a course of chemoradiation followed by consolidation treatment with immunotherapy using Imfinzi (durvalumab) every four weeks. She has completed ten cycles of this treatment and is currently receiving her eleventh cycle [name].  Since her last visit four weeks ago, she has experienced no new symptoms or issues. No rash, diarrhea, itching, chest pain, or breathing issues. She is tolerating the treatment well and describes herself as 'cruising nicely through this'.  She is planning to travel to [place] in May after completing her treatment. Her sister in [place] recently underwent hip replacement surgery and is recovering well. She mentioned that she did not learn to drive until she moved to the [place], as public transportation was sufficient in [place].        MEDICAL HISTORY: Past Medical History:  Diagnosis Date   Cancer (HCC)    Lung cancer   History of chicken pox    Hyperlipidemia    Hypertension    Menopause    Osteoporosis    Pneumonia    "walking" pneumonia   Pre-diabetes    Vitamin D deficiency     ALLERGIES:  is allergic to aspirin and latex.  MEDICATIONS:  Current Outpatient Medications  Medication Sig Dispense Refill   atorvastatin (LIPITOR) 40 MG tablet Take 40 mg by mouth every evening.      diltiazem (DILACOR XR) 240 MG 24 hr capsule Take 240 mg by mouth daily.     guaiFENesin (MUCINEX) 600 MG 12 hr tablet Take 1 tablet (600 mg total) by mouth 2 (two) times daily. (Patient taking differently: Take 600 mg by mouth daily.)  metoprolol succinate (TOPROL-XL) 50 MG 24 hr tablet Take 50 mg by mouth daily.     Multiple Vitamin (MULTIVITAMIN WITH MINERALS) TABS tablet Take 1 tablet by mouth daily.     triamterene-hydrochlorothiazide (MAXZIDE-25) 37.5-25 MG tablet Take 1 tablet by mouth daily.     No current facility-administered medications for this visit.    SURGICAL HISTORY:  Past Surgical History:   Procedure Laterality Date   CHEST TUBE INSERTION Left 01/19/2019   Procedure: Chest Tube Insertion;  Surgeon: Corliss Skains, MD;  Location: [facility] OR;  Service: Thoracic;  Laterality: Left;   PLEURADESIS N/A 01/06/2019   Procedure: Pleuradesis - Chemical;  Surgeon: Corliss Skains, MD;  Location: [facility] OR;  Service: Thoracic;  Laterality: N/A;   PLEURADESIS Left 01/19/2019   Procedure: Mechanical Pleuradesis;  Surgeon: Corliss Skains, MD;  Location: [facility] OR;  Service: Thoracic;  Laterality: Left;   VIDEO ASSISTED THORACOSCOPY Left 01/19/2019   Procedure: VIDEO ASSISTED THORACOSCOPY;  Surgeon: Corliss Skains, MD;  Location: [facility] OR;  Service: Thoracic;  Laterality: Left;   VIDEO ASSISTED THORACOSCOPY (VATS)/ LOBECTOMY Left 12/29/2018   Procedure: VIDEO ASSISTED THORACOSCOPY (VATS)/LEFT UPPER  LOBECTOMY with Mediastinal lymph node exploration.;  Surgeon: Corliss Skains, MD;  Location: [facility] OR;  Service: Thoracic;  Laterality: Left;   VIDEO BRONCHOSCOPY N/A 12/29/2018   Procedure: VIDEO BRONCHOSCOPY;  Surgeon: Corliss Skains, MD;  Location: [facility] OR;  Service: Thoracic;  Laterality: N/A;   VIDEO BRONCHOSCOPY WITH ENDOBRONCHIAL NAVIGATION N/A 12/09/2018   Procedure: VIDEO BRONCHOSCOPY WITH ENDOBRONCHIAL NAVIGATION;  Surgeon: Corliss Skains, MD;  Location: [facility] OR;  Service: Thoracic;  Laterality: N/A;   VIDEO BRONCHOSCOPY WITH INSERTION OF INTERBRONCHIAL VALVE (IBV) N/A 01/06/2019   Procedure: VIDEO BRONCHOSCOPY WITH INSERTION OF INTERBRONCHIAL VALVE (IBV);  Surgeon: Corliss Skains, MD;  Location: [facility] OR;  Service: Thoracic;  Laterality: N/A;   VIDEO BRONCHOSCOPY WITH INSERTION OF INTERBRONCHIAL VALVE (IBV) N/A 01/13/2019   Procedure: VIDEO BRONCHOSCOPY WITH INSERTION OF INTERBRONCHIAL VALVE (IBV);  Surgeon: Corliss Skains, MD;  Location: [facility] OR;  Service: Thoracic;  Laterality: N/A;   VIDEO BRONCHOSCOPY WITH INSERTION OF INTERBRONCHIAL VALVE (IBV) N/A 02/23/2019    Procedure: VIDEO BRONCHOSCOPY WITH REMOVAL OF INTERBRONCHIAL VALVE (IBV);  Surgeon: Corliss Skains, MD;  Location: [facility] OR;  Service: Thoracic;  Laterality: N/A;    REVIEW OF SYSTEMS:  A comprehensive review of systems was negative.   PHYSICAL EXAMINATION: General appearance: alert, cooperative, and no distress Head: Normocephalic, without obvious abnormality, atraumatic Neck: no adenopathy, no JVD, supple, symmetrical, trachea midline, and thyroid not enlarged, symmetric, no tenderness/mass/nodules Lymph nodes: Cervical, supraclavicular, and axillary nodes normal. Resp: clear to auscultation bilaterally Back: symmetric, no curvature. ROM normal. No CVA tenderness. Cardio: regular rate and rhythm, S1, S2 normal, no murmur, click, rub or gallop GI: soft, non-tender; bowel sounds normal; no masses,  no organomegaly Extremities: extremities normal, atraumatic, no cyanosis or edema  ECOG PERFORMANCE STATUS: 0 - Asymptomatic  Blood pressure 127/85, pulse 95, temperature 97.9 F (36.6 C), temperature source Temporal, resp. rate 16, height 5\' 9"  (1.753 m), weight 170 lb 14.4 oz (77.5 kg), SpO2 100%.  LABORATORY DATA: Lab Results  Component Value Date   WBC 7.0 05/02/2023   HGB 13.4 05/02/2023   HCT 39.8 05/02/2023   MCV 90.9 05/02/2023   PLT 198 05/02/2023      Chemistry      Component Value Date/Time   NA 138 05/02/2023 1054   K 3.4 (L) 05/02/2023  1054   CL 100 05/02/2023 1054   CO2 28 05/02/2023 1054   BUN 21 05/02/2023 1054   CREATININE 1.27 (H) 05/02/2023 1054   GLU 845 10/28/2009 0000      Component Value Date/Time   CALCIUM 9.4 05/02/2023 1054   ALKPHOS 128 (H) 05/02/2023 1054   AST 20 05/02/2023 1054   ALT 17 05/02/2023 1054   BILITOT 0.6 05/02/2023 1054       RADIOGRAPHIC STUDIES: No results found.   ASSESSMENT AND PLAN: This is a very pleasant 78 years old white [name] with recurrent non-small cell lung cancer that was initially diagnosed as stage Ib (T2  a, N0, M0) adenocarcinoma diagnosed in September 2020 status post left upper lobectomy with lymph node dissection with disease recurrence in July 2022 presenting with anterior mediastinal lymphadenopathy.  The patient is status post curative radiotherapy to this area under the care of Dr. Mitzi Hansen completed December 16, 2020.   She was found to have another evidence of disease recurrence in the AP window lymph nodes in February 2024.  She has brain MRI that showed no evidence of metastatic disease to the brain. She underwent concurrent chemoradiation with weekly carboplatin for AUC of 2 and paclitaxel 45 Mg/M2.  First dose May 08, 2022.  Status post 7 cycles.  Last dose was given on 06/18/2022 with partial response. The patient tolerated her previous treatment fairly well except for fatigue and odynophagia. She also has mild alopecia. She is currently undergoing consolidation treatment with immunotherapy with Imfinzi 1500 Mg IV every 4 weeks.  She started the first dose on Jul 26, 2022.  Status post 10 cycles.   She has been tolerating her treatment well with no concerning adverse effects.  Recurrent Non-Small Cell Lung Cancer (NSCLC) Adenocarcinoma Jennifer [name], a 78 year old [name], diagnosed with recurrent NSCLC adenocarcinoma in July 2022, has completed ten cycles of chemoradiation followed by durvalumab (Imfinzi) consolidation. She is currently on her eleventh cycle and reports no new symptoms, indicating good tolerance. She plans to travel to [place] in May after completing her treatment. - Administer eleventh cycle of durvalumab (Imfinzi) - Schedule next treatment in four weeks - Plan post-treatment monitoring with scans after completing the year-long treatment  Follow-up - Follow-up appointment in four weeks.   The patient was advised to call immediately if she has any concerning symptoms in the interval. The patient voices understanding of current disease status and treatment options and is  in agreement with the current care plan.  All questions were answered. The patient knows to call the clinic with any problems, questions or concerns. We can certainly see the patient much sooner if necessary.  The total time spent in the appointment was 20 minutes.  Disclaimer: This note was dictated with voice recognition software. Similar sounding words can inadvertently be transcribed and may not be corrected upon review.

## 2023-05-03 ENCOUNTER — Other Ambulatory Visit: Payer: Self-pay

## 2023-05-30 ENCOUNTER — Inpatient Hospital Stay: Payer: Medicare Other | Attending: Internal Medicine | Admitting: Internal Medicine

## 2023-05-30 ENCOUNTER — Inpatient Hospital Stay: Payer: Medicare Other | Attending: Internal Medicine

## 2023-05-30 ENCOUNTER — Inpatient Hospital Stay: Payer: Medicare Other

## 2023-05-30 ENCOUNTER — Other Ambulatory Visit: Payer: Self-pay

## 2023-05-30 VITALS — BP 133/86 | HR 94 | Temp 97.9°F | Resp 17 | Ht 69.0 in | Wt 172.1 lb

## 2023-05-30 DIAGNOSIS — C3490 Malignant neoplasm of unspecified part of unspecified bronchus or lung: Secondary | ICD-10-CM

## 2023-05-30 DIAGNOSIS — C3412 Malignant neoplasm of upper lobe, left bronchus or lung: Secondary | ICD-10-CM | POA: Diagnosis not present

## 2023-05-30 DIAGNOSIS — C771 Secondary and unspecified malignant neoplasm of intrathoracic lymph nodes: Secondary | ICD-10-CM | POA: Diagnosis not present

## 2023-05-30 DIAGNOSIS — Z923 Personal history of irradiation: Secondary | ICD-10-CM | POA: Diagnosis not present

## 2023-05-30 DIAGNOSIS — Z79620 Long term (current) use of immunosuppressive biologic: Secondary | ICD-10-CM | POA: Insufficient documentation

## 2023-05-30 DIAGNOSIS — Z5112 Encounter for antineoplastic immunotherapy: Secondary | ICD-10-CM | POA: Insufficient documentation

## 2023-05-30 DIAGNOSIS — L659 Nonscarring hair loss, unspecified: Secondary | ICD-10-CM | POA: Insufficient documentation

## 2023-05-30 LAB — CBC WITH DIFFERENTIAL (CANCER CENTER ONLY)
Abs Immature Granulocytes: 0.03 10*3/uL (ref 0.00–0.07)
Basophils Absolute: 0 10*3/uL (ref 0.0–0.1)
Basophils Relative: 1 %
Eosinophils Absolute: 0.1 10*3/uL (ref 0.0–0.5)
Eosinophils Relative: 2 %
HCT: 36.8 % (ref 36.0–46.0)
Hemoglobin: 12.6 g/dL (ref 12.0–15.0)
Immature Granulocytes: 0 %
Lymphocytes Relative: 20 %
Lymphs Abs: 1.5 10*3/uL (ref 0.7–4.0)
MCH: 31 pg (ref 26.0–34.0)
MCHC: 34.2 g/dL (ref 30.0–36.0)
MCV: 90.4 fL (ref 80.0–100.0)
Monocytes Absolute: 0.6 10*3/uL (ref 0.1–1.0)
Monocytes Relative: 8 %
Neutro Abs: 5.2 10*3/uL (ref 1.7–7.7)
Neutrophils Relative %: 69 %
Platelet Count: 190 10*3/uL (ref 150–400)
RBC: 4.07 MIL/uL (ref 3.87–5.11)
RDW: 12.9 % (ref 11.5–15.5)
WBC Count: 7.5 10*3/uL (ref 4.0–10.5)
nRBC: 0 % (ref 0.0–0.2)

## 2023-05-30 LAB — CMP (CANCER CENTER ONLY)
ALT: 17 U/L (ref 0–44)
AST: 19 U/L (ref 15–41)
Albumin: 4.6 g/dL (ref 3.5–5.0)
Alkaline Phosphatase: 130 U/L — ABNORMAL HIGH (ref 38–126)
Anion gap: 8 (ref 5–15)
BUN: 15 mg/dL (ref 8–23)
CO2: 29 mmol/L (ref 22–32)
Calcium: 9.6 mg/dL (ref 8.9–10.3)
Chloride: 102 mmol/L (ref 98–111)
Creatinine: 1.34 mg/dL — ABNORMAL HIGH (ref 0.44–1.00)
GFR, Estimated: 41 mL/min — ABNORMAL LOW (ref >60)
Glucose, Bld: 134 mg/dL — ABNORMAL HIGH (ref 70–99)
Potassium: 3.9 mmol/L (ref 3.5–5.1)
Sodium: 139 mmol/L (ref 135–145)
Total Bilirubin: 0.6 mg/dL (ref 0.0–1.2)
Total Protein: 7.4 g/dL (ref 6.5–8.1)

## 2023-05-30 LAB — TSH: TSH: 1.081 u[IU]/mL (ref 0.350–4.500)

## 2023-05-30 MED ORDER — SODIUM CHLORIDE 0.9 % IV SOLN
1500.0000 mg | Freq: Once | INTRAVENOUS | Status: AC
  Administered 2023-05-30: 1500 mg via INTRAVENOUS
  Filled 2023-05-30: qty 30

## 2023-05-30 MED ORDER — HEPARIN SOD (PORK) LOCK FLUSH 100 UNIT/ML IV SOLN: 500.0000 [IU] | Freq: Once | INTRAVENOUS | Status: DC | PRN

## 2023-05-30 MED ORDER — SODIUM CHLORIDE 0.9% FLUSH
10.0000 mL | INTRAVENOUS | Status: DC | PRN
Start: 2023-05-30 — End: 2023-05-30

## 2023-05-30 MED ORDER — SODIUM CHLORIDE 0.9 % IV SOLN: Freq: Once | INTRAVENOUS | Status: AC

## 2023-05-30 NOTE — Progress Notes (Signed)
 [facility] [phone]   [phone]  OFFICE PROGRESS NOTE  Felix Pacini, FNP [address] [place] [address]  DIAGNOSIS: Recurrent non-small cell lung cancer that was initially diagnosed as stage IB (T2 a, N0, M0) non-small cell lung cancer, adenocarcinoma diagnosed in September 2020. She is status post left upper lobectomy with lymph node dissection with tumor size of 3.2 cm and negative lymphadenopathy at that time. She had disease recurrence in July 2022 with anterior mediastinal lymphadenopathy which was consistent with metastatic non-small cell lung cancer.    Biomarker Findings Tumor Mutational Burden - 21 Muts/Mb Microsatellite status - MS-Stable Genomic Findings For a complete list of the genes assayed, please refer to the Appendix. KRAS G12V TP53 F134L 7 Disease relevant genes with no reportable alterations: ALK, BRAF, EGFR, ERBB2, MET, RET, ROS1   PDL1: 0%   PRIOR THERAPY: 1)  Left upper lobectomy under the care of Dr. Cliffton Asters in November 2020. 2) SBRT to the disease recurrence in the anterior mediastinal lymphadenopathy under the care of Dr. Mitzi Hansen. Last treatment 12/16/20. 3) Concurrent chemoradiation with carboplatin for an AUC of 2 and Taxol 45 mg/m2. First dose expected on 05/08/2022.  Status post 7 cycles with partial response.  CURRENT THERAPY: Consolidation treatment with immunotherapy with Imfinzi 1500 Mg IV every 4 weeks.  First dose Jul 26, 2022.  Status post 11 cycles .  INTERVAL HISTORY: Jennifer [name] 78 y.o. female returns to the clinic today for follow-up visit.  ENHERTU (Trastuzumab Derutecan)Discussed the use of AI scribe software for clinical note transcription with the patient, who gave verbal consent to proceed.  History of Present Illness   Jennifer [name] is a 78 year old female with recurrent non-small cell lung cancer who presents for cycle number twelve of immunotherapy.  Diagnosed with  recurrent non-small cell lung cancer in July 2022, she was found to have no actionable mutation and negative PD-L1 expression. She completed a course of concurrent chemoradiation with weekly carboplatin and paclitaxel.  She feels good with no complaints since her last visit.  No chest pain, breathing issues, nausea, vomiting, diarrhea, or rash.        MEDICAL HISTORY: Past Medical History:  Diagnosis Date   Cancer (HCC)    Lung cancer   History of chicken pox    Hyperlipidemia    Hypertension    Menopause    Osteoporosis    Pneumonia    "walking" pneumonia   Pre-diabetes    Vitamin D deficiency     ALLERGIES:  is allergic to aspirin and latex.  MEDICATIONS:  Current Outpatient Medications  Medication Sig Dispense Refill   atorvastatin (LIPITOR) 40 MG tablet Take 40 mg by mouth every evening.      diltiazem (DILACOR XR) 240 MG 24 hr capsule Take 240 mg by mouth daily.     guaiFENesin (MUCINEX) 600 MG 12 hr tablet Take 1 tablet (600 mg total) by mouth 2 (two) times daily. (Patient taking differently: Take 600 mg by mouth daily.)     metoprolol succinate (TOPROL-XL) 50 MG 24 hr tablet Take 50 mg by mouth daily.     Multiple Vitamin (MULTIVITAMIN WITH MINERALS) TABS tablet Take 1 tablet by mouth daily.     triamterene-hydrochlorothiazide (MAXZIDE-25) 37.5-25 MG tablet Take 1 tablet by mouth daily.     No current facility-administered medications for this visit.    SURGICAL HISTORY:  Past Surgical History:  Procedure Laterality Date   CHEST TUBE  INSERTION Left 01/19/2019   Procedure: Chest Tube Insertion;  Surgeon: Corliss Skains, MD;  Location: [facility] OR;  Service: Thoracic;  Laterality: Left;   PLEURADESIS N/A 01/06/2019   Procedure: Pleuradesis - Chemical;  Surgeon: Corliss Skains, MD;  Location: [facility] OR;  Service: Thoracic;  Laterality: N/A;   PLEURADESIS Left 01/19/2019   Procedure: Mechanical Pleuradesis;  Surgeon: Corliss Skains, MD;  Location: [facility] OR;   Service: Thoracic;  Laterality: Left;   VIDEO ASSISTED THORACOSCOPY Left 01/19/2019   Procedure: VIDEO ASSISTED THORACOSCOPY;  Surgeon: Corliss Skains, MD;  Location: [facility] OR;  Service: Thoracic;  Laterality: Left;   VIDEO ASSISTED THORACOSCOPY (VATS)/ LOBECTOMY Left 12/29/2018   Procedure: VIDEO ASSISTED THORACOSCOPY (VATS)/LEFT UPPER  LOBECTOMY with Mediastinal lymph node exploration.;  Surgeon: Corliss Skains, MD;  Location: [facility] OR;  Service: Thoracic;  Laterality: Left;   VIDEO BRONCHOSCOPY N/A 12/29/2018   Procedure: VIDEO BRONCHOSCOPY;  Surgeon: Corliss Skains, MD;  Location: [facility] OR;  Service: Thoracic;  Laterality: N/A;   VIDEO BRONCHOSCOPY WITH ENDOBRONCHIAL NAVIGATION N/A 12/09/2018   Procedure: VIDEO BRONCHOSCOPY WITH ENDOBRONCHIAL NAVIGATION;  Surgeon: Corliss Skains, MD;  Location: [facility] OR;  Service: Thoracic;  Laterality: N/A;   VIDEO BRONCHOSCOPY WITH INSERTION OF INTERBRONCHIAL VALVE (IBV) N/A 01/06/2019   Procedure: VIDEO BRONCHOSCOPY WITH INSERTION OF INTERBRONCHIAL VALVE (IBV);  Surgeon: Corliss Skains, MD;  Location[facility] OR;  Service: Thoracic;  Laterality: N/A;   VIDEO BRONCHOSCOPY WITH INSERTION OF INTERBRONCHIAL VALVE (IBV) N/A 01/13/2019   Procedure: VIDEO BRONCHOSCOPY WITH INSERTION OF INTERBRONCHIAL VALVE (IBV);  Surgeon: Corliss Skains, MD;  Location: [facility] OR;  Service: Thoracic;  Laterality: N/A;   VIDEO BRONCHOSCOPY WITH INSERTION OF INTERBRONCHIAL VALVE (IBV) N/A 02/23/2019   Procedure: VIDEO BRONCHOSCOPY WITH REMOVAL OF INTERBRONCHIAL VALVE (IBV);  Surgeon: Corliss Skains, MD;  Location: [facility] OR;  Service: Thoracic;  Laterality: N/A;    REVIEW OF SYSTEMS:  A comprehensive review of systems was negative.   PHYSICAL EXAMINATION: General appearance: alert, cooperative, and no distress Head: Normocephalic, without obvious abnormality, atraumatic Neck: no adenopathy, no JVD, supple, symmetrical, trachea midline, and thyroid not enlarged,  symmetric, no tenderness/mass/nodules Lymph nodes: Cervical, supraclavicular, and axillary nodes normal. Resp: clear to auscultation bilaterally Back: symmetric, no curvature. ROM normal. No CVA tenderness. Cardio: regular rate and rhythm, S1, S2 normal, no murmur, click, rub or gallop GI: soft, non-tender; bowel sounds normal; no masses,  no organomegaly Extremities: extremities normal, atraumatic, no cyanosis or edema  ECOG PERFORMANCE STATUS: 0 - Asymptomatic  Blood pressure 133/86, pulse 94, temperature 97.9 F (36.6 C), temperature source Temporal, resp. rate 17, height 5\' 9"  (1.753 m), weight 172 lb 1.6 oz (78.1 kg), SpO2 100%.  LABORATORY DATA: Lab Results  Component Value Date   WBC 7.5 05/30/2023   HGB 12.6 05/30/2023   HCT 36.8 05/30/2023   MCV 90.4 05/30/2023   PLT 190 05/30/2023      Chemistry      Component Value Date/Time   NA 138 05/02/2023 1054   K 3.4 (L) 05/02/2023 1054   CL 100 05/02/2023 1054   CO2 28 05/02/2023 1054   BUN 21 05/02/2023 1054   CREATININE 1.27 (H) 05/02/2023 1054   GLU 845 10/28/2009 0000      Component Value Date/Time   CALCIUM 9.4 05/02/2023 1054   ALKPHOS 128 (H) 05/02/2023 1054   AST 20 05/02/2023 1054   ALT 17 05/02/2023 1054   BILITOT 0.6 05/02/2023 1054  RADIOGRAPHIC STUDIES: No results found.   ASSESSMENT AND PLAN: This is a very pleasant 78 years old white female with recurrent non-small cell lung cancer that was initially diagnosed as stage Ib (T2 a, N0, M0) adenocarcinoma diagnosed in September 2020 status post left upper lobectomy with lymph node dissection with disease recurrence in July 2022 presenting with anterior mediastinal lymphadenopathy.  The patient is status post curative radiotherapy to this area under the care of Dr. Mitzi Hansen completed December 16, 2020.   She was found to have another evidence of disease recurrence in the AP window lymph nodes in February 2024.  She has brain MRI that showed no evidence of  metastatic disease to the brain. She underwent concurrent chemoradiation with weekly carboplatin for AUC of 2 and paclitaxel 45 Mg/M2.  First dose May 08, 2022.  Status post 7 cycles.  Last dose was given on 06/18/2022 with partial response. The patient tolerated her previous treatment fairly well except for fatigue and odynophagia. She also has mild alopecia. She is currently undergoing consolidation treatment with immunotherapy with Imfinzi 1500 Mg IV every 4 weeks.  She started the first dose on Jul 26, 2022.  Status post 11 cycles.      Recurrent non-small cell lung cancer Jennifer [name] has recurrent non-small cell lung cancer diagnosed in July 2022, with no actionable mutation and negative PD-L1 expression. She completed concurrent chemoradiation with weekly carboplatin and paclitaxel. Currently, she is on consolidation treatment with durvalumab every four weeks. She has completed eleven cycles and is here for the twelfth cycle. She reports no symptoms such as chest pain, dyspnea, nausea, vomiting, diarrhea, or rash, indicating good tolerance to the treatment. Her lab work is satisfactory for proceeding with the treatment. She is responding well to the treatment and is on track to complete the final cycle in four weeks. - Administer cycle twelve of durvalumab today. - Schedule the final cycle of immunotherapy in four weeks.   The patient was advised to call immediately if she has any concerning symptoms in the interval. The patient voices understanding of current disease status and treatment options and is in agreement with the current care plan.  All questions were answered. The patient knows to call the clinic with any problems, questions or concerns. We can certainly see the patient much sooner if necessary.  The total time spent in the appointment was 20 minutes.  Disclaimer: This note was dictated with voice recognition software. Similar sounding words can inadvertently be transcribed and  may not be corrected upon review.

## 2023-05-30 NOTE — Patient Instructions (Signed)
 [facility] - A DEPT OF [facility]  Discharge Instructions: Thank you for choosing [facility] to provide your oncology and hematology care.   If you have a lab appointment with the Cancer Center, please go directly to the Cancer Center and check in at the registration area.   Wear comfortable clothing and clothing appropriate for easy access to any Portacath or PICC line.   We strive to give you quality time with your provider. You may need to reschedule your appointment if you arrive late (15 or more minutes).  Arriving late affects you and other patients whose appointments are after yours.  Also, if you miss three or more appointments without notifying the office, you may be dismissed from the clinic at the provider's discretion.      For prescription refill requests, have your pharmacy contact our office and allow 72 hours for refills to be completed.    Today you received the following chemotherapy and/or immunotherapy agents :  durvalumab      To help prevent nausea and vomiting after your treatment, we encourage you to take your nausea medication as directed.  BELOW ARE SYMPTOMS THAT SHOULD BE REPORTED IMMEDIATELY: *FEVER GREATER THAN 100.4 F (38 C) OR HIGHER *CHILLS OR SWEATING *NAUSEA AND VOMITING THAT IS NOT CONTROLLED WITH YOUR NAUSEA MEDICATION *UNUSUAL SHORTNESS OF BREATH *UNUSUAL BRUISING OR BLEEDING *URINARY PROBLEMS (pain or burning when urinating, or frequent urination) *BOWEL PROBLEMS (unusual diarrhea, constipation, pain near the anus) TENDERNESS IN MOUTH AND THROAT WITH OR WITHOUT PRESENCE OF ULCERS (sore throat, sores in mouth, or a toothache) UNUSUAL RASH, SWELLING OR PAIN  UNUSUAL VAGINAL DISCHARGE OR ITCHING   Items with * indicate a potential emergency and should be followed up as soon as possible or go to the Emergency Department if any problems should occur.  Please show the CHEMOTHERAPY ALERT CARD or  IMMUNOTHERAPY ALERT CARD at check-in to the Emergency Department and triage nurse.  Should you have questions after your visit or need to cancel or reschedule your appointment, please contact [facility] - A DEPT [facility]  Dept: [phone]  and follow the prompts.  Office hours are 8:00 a.m. to 4:30 p.m. Monday - Friday. Please note that voicemails left after 4:00 p.m. may not be returned until the following business day.  We are closed weekends and major holidays. You have access to a nurse at all times for urgent questions. Please call the main number to the clinic Dept: [phone] and follow the prompts.   For any non-urgent questions, you may also contact your provider using MyChart. We now offer e-Visits for anyone 30 and older to request care online for non-urgent symptoms. For details visit [email or web address].   Also download the MyChart app! Go to the app store, search "MyChart", open the app, select [place], and log in with your MyChart username and password.

## 2023-05-31 LAB — T4: T4, Total: 6.8 ug/dL (ref 4.5–12.0)

## 2023-06-07 ENCOUNTER — Other Ambulatory Visit: Payer: Self-pay

## 2023-06-23 NOTE — Progress Notes (Signed)
 [facility] OFFICE PROGRESS NOTE  Jennifer Meigs, FNP [address] [place] [address]  DIAGNOSIS: Recurrent non-small cell lung cancer that was initially diagnosed as stage IB (T2 a, N0, M0) non-small cell lung cancer, adenocarcinoma diagnosed in September 2020. She is status post left upper lobectomy with lymph node dissection with tumor size of 3.2 cm and negative lymphadenopathy at that time. She had disease recurrence in July 2022 with anterior mediastinal lymphadenopathy which was consistent with metastatic non-small cell lung cancer.    Biomarker Findings Tumor Mutational Burden - 21 Muts/Mb Microsatellite status - MS-Stable Genomic Findings For a complete list of the genes assayed, please refer to the Appendix. KRAS G12V TP53 F134L 7 Disease relevant genes with no reportable alterations: ALK, BRAF, EGFR, ERBB2, MET, RET, ROS1   PDL1: 0%  PRIOR THERAPY: 1)  Left upper lobectomy under the care of Dr. Deloise Ferries in November 2020. 2) SBRT to the disease recurrence in the anterior mediastinal lymphadenopathy under the care of Dr. Jeryl Moris. Last treatment 12/16/20. 3) Concurrent chemoradiation with carboplatin  for an AUC of 2 and Taxol  45 mg/m2. First dose expected on 05/08/2022.  Status post 7 cycles with partial response.   CURRENT THERAPY: Consolidation treatment with immunotherapy with Imfinzi  1500 Mg IV every 4 weeks.  First dose Jul 26, 2022.  Status post 12 cycles .   INTERVAL HISTORY: Jennifer [name] 78 y.o. female returns  to the clinic for a follow up visit. The patient is feeling well today without any concerning complaints. She may go visit her sister in [place] soon but does not have definitive travel dates yet. The patient continues to tolerate treatment with immunotherapy with Imfinzi  well without any adverse effects. Denies any fever, chills, night sweats, or weight loss. Denies any chest pain or hemoptysis. She denies dyspnea on exertion but since chemo  gets tired more easily. She denies cough. Denies any nausea, vomiting, diarrhea, or constipation. Denies any headache or visual changes. Denies any rashes or skin changes. She continues to bruise easily The patient is here today for evaluation prior to starting cycle # 13.   MEDICAL HISTORY: Past Medical History:  Diagnosis Date   Cancer (HCC)    Lung cancer   History of chicken pox    Hyperlipidemia    Hypertension    Menopause    Osteoporosis    Pneumonia    "walking" pneumonia   Pre-diabetes    Vitamin D deficiency     ALLERGIES:  is allergic to aspirin and latex.  MEDICATIONS:  Current Outpatient Medications  Medication Sig Dispense Refill   atorvastatin  (LIPITOR) 40 MG tablet Take 40 mg by mouth every evening.      diltiazem (DILACOR XR) 240 MG 24 hr capsule Take 240 mg by mouth daily.     guaiFENesin  (MUCINEX ) 600 MG 12 hr tablet Take 1 tablet (600 mg total) by mouth 2 (two) times daily. (Patient taking differently: Take 600 mg by mouth daily.)     metoprolol succinate (TOPROL-XL) 50 MG 24 hr tablet Take 50 mg by mouth daily.     Multiple Vitamin (MULTIVITAMIN WITH MINERALS) TABS tablet Take 1 tablet by mouth daily.     triamterene-hydrochlorothiazide (MAXZIDE-25) 37.5-25 MG tablet Take 1 tablet by mouth daily.     No current facility-administered medications for this visit.    SURGICAL HISTORY:  Past Surgical History:  Procedure Laterality Date   CHEST TUBE INSERTION Left 01/19/2019   Procedure: Chest Tube Insertion;  Surgeon: Starleen Eastern  O, MD;  Location: [facility] OR;  Service: Thoracic;  Laterality: Left;   PLEURADESIS N/A 01/06/2019   Procedure: Pleuradesis - Chemical;  Surgeon: Hilarie Lovely, MD;  Location: [facility] OR;  Service: Thoracic;  Laterality: N/A;   PLEURADESIS Left 01/19/2019   Procedure: Mechanical Pleuradesis;  Surgeon: Hilarie Lovely, MD;  Location: [facility] OR;  Service: Thoracic;  Laterality: Left;   VIDEO ASSISTED THORACOSCOPY Left 01/19/2019    Procedure: VIDEO ASSISTED THORACOSCOPY;  Surgeon: Hilarie Lovely, MD;  Location: [facility] OR;  Service: Thoracic;  Laterality: Left;   VIDEO ASSISTED THORACOSCOPY (VATS)/ LOBECTOMY Left 12/29/2018   Procedure: VIDEO ASSISTED THORACOSCOPY (VATS)/LEFT UPPER  LOBECTOMY with Mediastinal lymph node exploration.;  Surgeon: Hilarie Lovely, MD;  Location: [facility] OR;  Service: Thoracic;  Laterality: Left;   VIDEO BRONCHOSCOPY N/A 12/29/2018   Procedure: VIDEO BRONCHOSCOPY;  Surgeon: Hilarie Lovely, MD;  Location: [facility] OR;  Service: Thoracic;  Laterality: N/A;   VIDEO BRONCHOSCOPY WITH ENDOBRONCHIAL NAVIGATION N/A 12/09/2018   Procedure: VIDEO BRONCHOSCOPY WITH ENDOBRONCHIAL NAVIGATION;  Surgeon: Hilarie Lovely, MD;  Location: [facility] OR;  Service: Thoracic;  Laterality: N/A;   VIDEO BRONCHOSCOPY WITH INSERTION OF INTERBRONCHIAL VALVE (IBV) N/A 01/06/2019   Procedure: VIDEO BRONCHOSCOPY WITH INSERTION OF INTERBRONCHIAL VALVE (IBV);  Surgeon: Hilarie Lovely, MD;  Location: [facility] OR;  Service: Thoracic;  Laterality: N/A;   VIDEO BRONCHOSCOPY WITH INSERTION OF INTERBRONCHIAL VALVE (IBV) N/A 01/13/2019   Procedure: VIDEO BRONCHOSCOPY WITH INSERTION OF INTERBRONCHIAL VALVE (IBV);  Surgeon: Hilarie Lovely, MD;  Location: [facility] OR;  Service: Thoracic;  Laterality: N/A;   VIDEO BRONCHOSCOPY WITH INSERTION OF INTERBRONCHIAL VALVE (IBV) N/A 02/23/2019   Procedure: VIDEO BRONCHOSCOPY WITH REMOVAL OF INTERBRONCHIAL VALVE (IBV);  Surgeon: Hilarie Lovely, MD;  Location: [facility] OR;  Service: Thoracic;  Laterality: N/A;    REVIEW OF SYSTEMS:   Review of Systems  Constitutional: Negative for appetite change, chills, fatigue, fever and unexpected weight change.  HENT: Negative for mouth sores, nosebleeds, sore throat and trouble swallowing.   Eyes: Negative for eye problems and icterus.  Respiratory: Negative for cough, hemoptysis, shortness of breath and wheezing.   Cardiovascular: Negative for chest pain and  leg swelling.  Gastrointestinal: Negative for abdominal pain, constipation, diarrhea, nausea and vomiting.  Genitourinary: Negative for bladder incontinence, difficulty urinating, dysuria, frequency and hematuria.   Musculoskeletal: Negative for back pain, gait problem, neck pain and neck stiffness.  Skin: Negative for itching and rash.  Neurological: Negative for dizziness, extremity weakness, gait problem, headaches, light-headedness and seizures.  Hematological: Negative for adenopathy. Does not bleed easily. Positive for bruising.  Psychiatric/Behavioral: Negative for confusion, depression and sleep disturbance. The patient is not nervous/anxious.     PHYSICAL EXAMINATION:  Blood pressure (!) 159/67, pulse 85, temperature 98 F (36.7 C), temperature source Temporal, resp. rate 15, weight 170 lb 6.4 oz (77.3 kg), SpO2 96%.  ECOG PERFORMANCE STATUS: 1  Physical Exam  Constitutional: Oriented to person, place, and time and well-developed, well-nourished, and in no distress.  HENT:  Head: Normocephalic and atraumatic.  Mouth/Throat: Oropharynx is clear and moist. No oropharyngeal exudate.  Eyes: Conjunctivae are normal. Right eye exhibits no discharge. Left eye exhibits no discharge. No scleral icterus.  Neck: Normal range of motion. Neck supple.  Cardiovascular: Normal rate, regular rhythm, normal heart sounds and intact distal pulses.   Pulmonary/Chest: Effort normal and breath sounds normal. No respiratory distress. No wheezes. No rales.  Abdominal: Soft. Bowel sounds are normal. Exhibits no distension  and no mass. There is no tenderness.  Musculoskeletal: Normal range of motion. Exhibits no edema.  Lymphadenopathy:    No cervical adenopathy.  Neurological: Alert and oriented to person, place, and time. Exhibits normal muscle tone. Gait normal. Coordination normal.  Skin: Skin is warm and dry. No rash noted. Not diaphoretic. No erythema. No pallor.  Psychiatric: Mood, memory and  judgment normal.  Vitals reviewed.  LABORATORY DATA: Lab Results  Component Value Date   WBC 6.9 06/27/2023   HGB 13.0 06/27/2023   HCT 37.9 06/27/2023   MCV 90.9 06/27/2023   PLT 186 06/27/2023      Chemistry      Component Value Date/Time   NA 139 05/30/2023 1036   K 3.9 05/30/2023 1036   CL 102 05/30/2023 1036   CO2 29 05/30/2023 1036   BUN 15 05/30/2023 1036   CREATININE 1.34 (H) 05/30/2023 1036   GLU 845 10/28/2009 0000      Component Value Date/Time   CALCIUM  9.6 05/30/2023 1036   ALKPHOS 130 (H) 05/30/2023 1036   AST 19 05/30/2023 1036   ALT 17 05/30/2023 1036   BILITOT 0.6 05/30/2023 1036       RADIOGRAPHIC STUDIES:  No results found.   ASSESSMENT/PLAN:  This is a very pleasant 78 year old Caucasian female with recurrent non-small cell lung cancer that was initially diagnosed with stage Ib (T2 a, N0, M0) non-small cell lung cancer, adenocarcinoma.  She was initially diagnosed in September of 2020.  She was initially diagnosed with a left upper lobe lesion status post left upper lobectomy lymph node dissection with a tumor size of 3.2 and negative lymphadenopathy at that time.  The patient and September 2022 was found to have disease recurrence with biopsy-proven metastatic non-small cell lung cancer in the mediastinal lymph node.  PD-L1 expression is 0%.  She is negative for any actionable mutations.     She completed SBRT to the disease recurrence on 12/16/20 under the care of Dr. Jeryl Moris.    She had a restaging CT scan that showed enlarging AP window lymph node in January 2024. The PET CT in February 2024 confirmed the AP window lymph node is significantly hypermetabolic.  Therefore, this is compatible with disease recurrence.    She underwent concurrent chemoradiation with weekly carboplatin  for AUC of 2 and paclitaxel  45 Mg/M2. First dose May 08, 2022. Status post 7 cycles. Last dose was given on 06/18/2022 with partial response.    She is currently on  consolidation immunotherapy with Imfinzi  1500 mg every 4 weeks. She is status post 12 cycles.    Labs were reviewed.  Recommend that she proceed with cycle #13 today as scheduled.   I will arrange for a restaging CT scan of the chest prior to her next appointment   We will see her back for follow-up visit in 4 weeks.  She will call us  if she needs to modify the appointments based on her travel schedule.   The patient was advised to call immediately if she has any concerning symptoms in the interval. The patient voices understanding of current disease status and treatment options and is in agreement with the current care plan. All questions were answered. The patient knows to call the clinic with any problems, questions or concerns. We can certainly see the patient much sooner if necessary    Orders Placed This Encounter  Procedures   CT Chest W Contrast    Standing Status:   Future    Expected Date:  07/18/2023    Expiration Date:   06/26/2024    If indicated for the ordered procedure, I authorize the administration of contrast media per Radiology protocol:   Yes    Does the patient have a contrast media/X-ray dye allergy?:   No    Preferred imaging location?:   [facility]      The total time spent in the appointment was 20-29 minutes  Sabrina Keough L Claiborne Stroble, PA-C 06/27/23

## 2023-06-26 ENCOUNTER — Encounter: Payer: Self-pay | Admitting: Internal Medicine

## 2023-06-26 ENCOUNTER — Other Ambulatory Visit: Payer: Self-pay | Admitting: Physician Assistant

## 2023-06-26 DIAGNOSIS — R5383 Other fatigue: Secondary | ICD-10-CM

## 2023-06-26 DIAGNOSIS — C3490 Malignant neoplasm of unspecified part of unspecified bronchus or lung: Secondary | ICD-10-CM

## 2023-06-27 ENCOUNTER — Inpatient Hospital Stay: Attending: Internal Medicine

## 2023-06-27 ENCOUNTER — Inpatient Hospital Stay

## 2023-06-27 ENCOUNTER — Inpatient Hospital Stay: Admitting: Physician Assistant

## 2023-06-27 VITALS — BP 159/67 | HR 85 | Temp 98.0°F | Resp 15 | Wt 170.4 lb

## 2023-06-27 VITALS — BP 126/63 | HR 63 | Temp 97.9°F | Resp 16

## 2023-06-27 DIAGNOSIS — R5383 Other fatigue: Secondary | ICD-10-CM

## 2023-06-27 DIAGNOSIS — C3490 Malignant neoplasm of unspecified part of unspecified bronchus or lung: Secondary | ICD-10-CM

## 2023-06-27 DIAGNOSIS — Z5112 Encounter for antineoplastic immunotherapy: Secondary | ICD-10-CM | POA: Insufficient documentation

## 2023-06-27 DIAGNOSIS — C771 Secondary and unspecified malignant neoplasm of intrathoracic lymph nodes: Secondary | ICD-10-CM | POA: Diagnosis not present

## 2023-06-27 DIAGNOSIS — C3412 Malignant neoplasm of upper lobe, left bronchus or lung: Secondary | ICD-10-CM | POA: Insufficient documentation

## 2023-06-27 DIAGNOSIS — Z923 Personal history of irradiation: Secondary | ICD-10-CM | POA: Insufficient documentation

## 2023-06-27 DIAGNOSIS — Z5111 Encounter for antineoplastic chemotherapy: Secondary | ICD-10-CM | POA: Diagnosis not present

## 2023-06-27 DIAGNOSIS — Z902 Acquired absence of lung [part of]: Secondary | ICD-10-CM | POA: Diagnosis not present

## 2023-06-27 DIAGNOSIS — Z79620 Long term (current) use of immunosuppressive biologic: Secondary | ICD-10-CM | POA: Diagnosis not present

## 2023-06-27 LAB — CBC WITH DIFFERENTIAL (CANCER CENTER ONLY)
Abs Immature Granulocytes: 0.02 10*3/uL (ref 0.00–0.07)
Basophils Absolute: 0 10*3/uL (ref 0.0–0.1)
Basophils Relative: 0 %
Eosinophils Absolute: 0.2 10*3/uL (ref 0.0–0.5)
Eosinophils Relative: 4 %
HCT: 37.9 % (ref 36.0–46.0)
Hemoglobin: 13 g/dL (ref 12.0–15.0)
Immature Granulocytes: 0 %
Lymphocytes Relative: 22 %
Lymphs Abs: 1.5 10*3/uL (ref 0.7–4.0)
MCH: 31.2 pg (ref 26.0–34.0)
MCHC: 34.3 g/dL (ref 30.0–36.0)
MCV: 90.9 fL (ref 80.0–100.0)
Monocytes Absolute: 0.5 10*3/uL (ref 0.1–1.0)
Monocytes Relative: 8 %
Neutro Abs: 4.6 10*3/uL (ref 1.7–7.7)
Neutrophils Relative %: 66 %
Platelet Count: 186 10*3/uL (ref 150–400)
RBC: 4.17 MIL/uL (ref 3.87–5.11)
RDW: 13.2 % (ref 11.5–15.5)
WBC Count: 6.9 10*3/uL (ref 4.0–10.5)
nRBC: 0 % (ref 0.0–0.2)

## 2023-06-27 LAB — CMP (CANCER CENTER ONLY)
ALT: 19 U/L (ref 0–44)
AST: 22 U/L (ref 15–41)
Albumin: 4.8 g/dL (ref 3.5–5.0)
Alkaline Phosphatase: 132 U/L — ABNORMAL HIGH (ref 38–126)
Anion gap: 12 (ref 5–15)
BUN: 21 mg/dL (ref 8–23)
CO2: 28 mmol/L (ref 22–32)
Calcium: 9.6 mg/dL (ref 8.9–10.3)
Chloride: 101 mmol/L (ref 98–111)
Creatinine: 1.36 mg/dL — ABNORMAL HIGH (ref 0.44–1.00)
GFR, Estimated: 40 mL/min — ABNORMAL LOW (ref >60)
Glucose, Bld: 117 mg/dL — ABNORMAL HIGH (ref 70–99)
Potassium: 3.7 mmol/L (ref 3.5–5.1)
Sodium: 141 mmol/L (ref 135–145)
Total Bilirubin: 0.6 mg/dL (ref 0.0–1.2)
Total Protein: 7.2 g/dL (ref 6.5–8.1)

## 2023-06-27 LAB — TSH: TSH: 1.78 u[IU]/mL (ref 0.350–4.500)

## 2023-06-27 MED ORDER — SODIUM CHLORIDE 0.9 % IV SOLN
1500.0000 mg | Freq: Once | INTRAVENOUS | Status: AC
  Administered 2023-06-27: 1500 mg via INTRAVENOUS
  Filled 2023-06-27: qty 30

## 2023-06-27 MED ORDER — SODIUM CHLORIDE 0.9 % IV SOLN
Freq: Once | INTRAVENOUS | Status: AC
Start: 2023-06-27 — End: 2023-06-27

## 2023-06-27 NOTE — Patient Instructions (Signed)
 [facility] - A DEPT OF [facility]  Discharge Instructions: Thank you for choosing [facility] to provide your oncology and hematology care.   If you have a lab appointment with the Cancer Center, please go directly to the Cancer Center and check in at the registration area.   Wear comfortable clothing and clothing appropriate for easy access to any Portacath or PICC line.   We strive to give you quality time with your provider. You may need to reschedule your appointment if you arrive late (15 or more minutes).  Arriving late affects you and other patients whose appointments are after yours.  Also, if you miss three or more appointments without notifying the office, you may be dismissed from the clinic at the provider's discretion.      For prescription refill requests, have your pharmacy contact our office and allow 72 hours for refills to be completed.    Today you received the following chemotherapy and/or immunotherapy agent: Durvalumab (Imfinzi).    To help prevent nausea and vomiting after your treatment, we encourage you to take your nausea medication as directed.  BELOW ARE SYMPTOMS THAT SHOULD BE REPORTED IMMEDIATELY: *FEVER GREATER THAN 100.4 F (38 C) OR HIGHER *CHILLS OR SWEATING *NAUSEA AND VOMITING THAT IS NOT CONTROLLED WITH YOUR NAUSEA MEDICATION *UNUSUAL SHORTNESS OF BREATH *UNUSUAL BRUISING OR BLEEDING *URINARY PROBLEMS (pain or burning when urinating, or frequent urination) *BOWEL PROBLEMS (unusual diarrhea, constipation, pain near the anus) TENDERNESS IN MOUTH AND THROAT WITH OR WITHOUT PRESENCE OF ULCERS (sore throat, sores in mouth, or a toothache) UNUSUAL RASH, SWELLING OR PAIN  UNUSUAL VAGINAL DISCHARGE OR ITCHING   Items with * indicate a potential emergency and should be followed up as soon as possible or go to the Emergency Department if any problems should occur.  Please show the CHEMOTHERAPY ALERT CARD or  IMMUNOTHERAPY ALERT CARD at check-in to the Emergency Department and triage nurse.  Should you have questions after your visit or need to cancel or reschedule your appointment, please contact [facility] - A DE[facility]  Dept: [phone]  and follow the prompts.  Office hours are 8:00 a.m. to 4:30 p.m. Monday - Friday. Please note that voicemails left after 4:00 p.m. may not be returned until the following business day.  We are closed weekends and major holidays. You have access to a nurse at all times for urgent questions. Please call the main number to the clinic Dept: [phone] and follow the prompts.   For any non-urgent questions, you may also contact your provider using MyChart. We now offer e-Visits for anyone 11 and older to request care online for non-urgent symptoms. For details visit [email or web address].   Also download the MyChart app! Go to the app store, search "MyChart", open the app, select [place], and log in with your MyChart username and password.

## 2023-06-28 LAB — T4: T4, Total: 7.6 ug/dL (ref 4.5–12.0)

## 2023-06-29 ENCOUNTER — Other Ambulatory Visit: Payer: Self-pay

## 2023-07-04 ENCOUNTER — Other Ambulatory Visit: Payer: Self-pay

## 2023-07-17 ENCOUNTER — Other Ambulatory Visit: Payer: Self-pay | Admitting: *Deleted

## 2023-07-17 DIAGNOSIS — C3490 Malignant neoplasm of unspecified part of unspecified bronchus or lung: Secondary | ICD-10-CM

## 2023-07-18 ENCOUNTER — Ambulatory Visit (HOSPITAL_COMMUNITY)
Admission: RE | Admit: 2023-07-18 | Discharge: 2023-07-18 | Disposition: A | Discharge status: Home / Self Care | Source: Ambulatory Visit | Attending: Physician Assistant | Admitting: Physician Assistant

## 2023-07-18 ENCOUNTER — Inpatient Hospital Stay

## 2023-07-18 ENCOUNTER — Encounter (HOSPITAL_COMMUNITY): Payer: Self-pay

## 2023-07-18 DIAGNOSIS — C3490 Malignant neoplasm of unspecified part of unspecified bronchus or lung: Secondary | ICD-10-CM | POA: Insufficient documentation

## 2023-07-18 DIAGNOSIS — Z5112 Encounter for antineoplastic immunotherapy: Secondary | ICD-10-CM | POA: Diagnosis not present

## 2023-07-18 LAB — CBC WITH DIFFERENTIAL (CANCER CENTER ONLY)
Abs Immature Granulocytes: 0.03 10*3/uL (ref 0.00–0.07)
Basophils Absolute: 0 10*3/uL (ref 0.0–0.1)
Basophils Relative: 0 %
Eosinophils Absolute: 0.2 10*3/uL (ref 0.0–0.5)
Eosinophils Relative: 2 %
HCT: 38.5 % (ref 36.0–46.0)
Hemoglobin: 13.1 g/dL (ref 12.0–15.0)
Immature Granulocytes: 0 %
Lymphocytes Relative: 23 %
Lymphs Abs: 1.8 10*3/uL (ref 0.7–4.0)
MCH: 31.1 pg (ref 26.0–34.0)
MCHC: 34 g/dL (ref 30.0–36.0)
MCV: 91.4 fL (ref 80.0–100.0)
Monocytes Absolute: 0.6 10*3/uL (ref 0.1–1.0)
Monocytes Relative: 7 %
Neutro Abs: 5 10*3/uL (ref 1.7–7.7)
Neutrophils Relative %: 68 %
Platelet Count: 188 10*3/uL (ref 150–400)
RBC: 4.21 MIL/uL (ref 3.87–5.11)
RDW: 13.2 % (ref 11.5–15.5)
WBC Count: 7.5 10*3/uL (ref 4.0–10.5)
nRBC: 0 % (ref 0.0–0.2)

## 2023-07-18 LAB — CMP (CANCER CENTER ONLY)
ALT: 20 U/L (ref 0–44)
AST: 21 U/L (ref 15–41)
Albumin: 4.5 g/dL (ref 3.5–5.0)
Alkaline Phosphatase: 125 U/L (ref 38–126)
Anion gap: 10 (ref 5–15)
BUN: 16 mg/dL (ref 8–23)
CO2: 29 mmol/L (ref 22–32)
Calcium: 9.5 mg/dL (ref 8.9–10.3)
Chloride: 102 mmol/L (ref 98–111)
Creatinine: 1.27 mg/dL — ABNORMAL HIGH (ref 0.44–1.00)
GFR, Estimated: 44 mL/min — ABNORMAL LOW (ref >60)
Glucose, Bld: 116 mg/dL — ABNORMAL HIGH (ref 70–99)
Potassium: 3.8 mmol/L (ref 3.5–5.1)
Sodium: 141 mmol/L (ref 135–145)
Total Bilirubin: 0.6 mg/dL (ref 0.0–1.2)
Total Protein: 7.3 g/dL (ref 6.5–8.1)

## 2023-07-18 MED ORDER — IOHEXOL 300 MG/ML  SOLN
75.0000 mL | Freq: Once | INTRAMUSCULAR | Status: AC | PRN
  Administered 2023-07-18: 75 mL via INTRAVENOUS

## 2023-07-18 MED ORDER — SODIUM CHLORIDE (PF) 0.9 % IJ SOLN
INTRAMUSCULAR | Status: AC
  Filled 2023-07-18: qty 50

## 2023-07-29 ENCOUNTER — Ambulatory Visit: Admitting: Internal Medicine

## 2023-08-01 ENCOUNTER — Inpatient Hospital Stay: Attending: Internal Medicine | Admitting: Internal Medicine

## 2023-08-01 VITALS — BP 136/63 | HR 96 | Temp 98.6°F | Resp 16 | Ht 69.0 in | Wt 171.4 lb

## 2023-08-01 DIAGNOSIS — Z9221 Personal history of antineoplastic chemotherapy: Secondary | ICD-10-CM | POA: Diagnosis not present

## 2023-08-01 DIAGNOSIS — Z902 Acquired absence of lung [part of]: Secondary | ICD-10-CM | POA: Diagnosis not present

## 2023-08-01 DIAGNOSIS — C3490 Malignant neoplasm of unspecified part of unspecified bronchus or lung: Secondary | ICD-10-CM

## 2023-08-01 DIAGNOSIS — C3412 Malignant neoplasm of upper lobe, left bronchus or lung: Secondary | ICD-10-CM | POA: Insufficient documentation

## 2023-08-01 DIAGNOSIS — Z923 Personal history of irradiation: Secondary | ICD-10-CM | POA: Insufficient documentation

## 2023-08-01 DIAGNOSIS — L659 Nonscarring hair loss, unspecified: Secondary | ICD-10-CM | POA: Diagnosis not present

## 2023-08-01 NOTE — Progress Notes (Signed)
 [facility] [phone]   Fax:([phone]  OFFICE PROGRESS NOTE  Jennifer Meigs, FNP [address] [place] [address]  DIAGNOSIS: Recurrent non-small cell lung cancer that was initially diagnosed as stage IB (T2 a, N0, M0) non-small cell lung cancer, adenocarcinoma diagnosed in September 2020. She is status post left upper lobectomy with lymph node dissection with tumor size of 3.2 cm and negative lymphadenopathy at that time. She had disease recurrence in July 2022 with anterior mediastinal lymphadenopathy which was consistent with metastatic non-small cell lung cancer.    Biomarker Findings Tumor Mutational Burden - 21 Muts/Mb Microsatellite status - MS-Stable Genomic Findings For a complete list of the genes assayed, please refer to the Appendix. KRAS G12V TP53 F134L 7 Disease relevant genes with no reportable alterations: ALK, BRAF, EGFR, ERBB2, MET, RET, ROS1   PDL1: 0%   PRIOR THERAPY: 1)  Left upper lobectomy under the care of Dr. Deloise Ferries in November 2020. 2) SBRT to the disease recurrence in the anterior mediastinal lymphadenopathy under the care of Dr. Jeryl Moris. Last treatment 12/16/20. 3) Concurrent chemoradiation with carboplatin  for an AUC of 2 and Taxol  45 mg/m2. First dose expected on 05/08/2022.  Status post 7 cycles with partial response. 4) Consolidation treatment with immunotherapy with Imfinzi  1500 Mg IV every 4 weeks.  First dose Jul 26, 2022.  Status post 13 cycles .  CURRENT THERAPY: Observation.  INTERVAL HISTORY: Jennifer [name] 78 y.o. female returns to the clinic today for follow-up visit.  The patient is feeling fine today with no concerning complaints.  She denied having any current chest pain, shortness of breath, cough or hemoptysis.  She has no recent weight loss or night sweats.  She has no nausea, vomiting, diarrhea or constipation.  She has no headache or visual changes.  She enjoyed sometimes at the beach with  her grandson recently.  She is here today for evaluation with repeat CT scan of the chest for restaging of her disease.    MEDICAL HISTORY: Past Medical History:  Diagnosis Date   Cancer (HCC)    Lung cancer   History of chicken pox    Hyperlipidemia    Hypertension    Menopause    Osteoporosis    Pneumonia    "walking" pneumonia   Pre-diabetes    Vitamin D deficiency     ALLERGIES:  is allergic to aspirin and latex.  MEDICATIONS:  Current Outpatient Medications  Medication Sig Dispense Refill   atorvastatin  (LIPITOR) 40 MG tablet Take 40 mg by mouth every evening.      diltiazem (DILACOR XR) 240 MG 24 hr capsule Take 240 mg by mouth daily.     guaiFENesin  (MUCINEX ) 600 MG 12 hr tablet Take 1 tablet (600 mg total) by mouth 2 (two) times daily. (Patient taking differently: Take 600 mg by mouth daily.)     metoprolol succinate (TOPROL-XL) 50 MG 24 hr tablet Take 50 mg by mouth daily.     Multiple Vitamin (MULTIVITAMIN WITH MINERALS) TABS tablet Take 1 tablet by mouth daily.     triamterene-hydrochlorothiazide (MAXZIDE-25) 37.5-25 MG tablet Take 1 tablet by mouth daily.     No current facility-administered medications for this visit.    SURGICAL HISTORY:  Past Surgical History:  Procedure Laterality Date   CHEST TUBE INSERTION Left 01/19/2019   Procedure: Chest Tube Insertion;  Surgeon: Hilarie Lovely, MD;  Location: [facility] OR;  Service: Thoracic;  Laterality: Left;   PLEURADESIS N/A  01/06/2019   Procedure: Pleuradesis - Chemical;  Surgeon: Hilarie Lovely, MD;  Location: [facility] OR;  Service: Thoracic;  Laterality: N/A;   PLEURADESIS Left 01/19/2019   Procedure: Mechanical Pleuradesis;  Surgeon: Hilarie Lovely, MD;  Location: [facility] OR;  Service: Thoracic;  Laterality: Left;   VIDEO ASSISTED THORACOSCOPY Left 01/19/2019   Procedure: VIDEO ASSISTED THORACOSCOPY;  Surgeon: Hilarie Lovely, MD;  Location: [facility] OR;  Service: Thoracic;  Laterality: Left;   VIDEO ASSISTED  THORACOSCOPY (VATS)/ LOBECTOMY Left 12/29/2018   Procedure: VIDEO ASSISTED THORACOSCOPY (VATS)/LEFT UPPER  LOBECTOMY with Mediastinal lymph node exploration.;  Surgeon: Hilarie Lovely, MD;  Location: [facility] OR;  Service: Thoracic;  Laterality: Left;   VIDEO BRONCHOSCOPY N/A 12/29/2018   Procedure: VIDEO BRONCHOSCOPY;  Surgeon: Hilarie Lovely, MD;  Location: [facility] OR;  Service: Thoracic;  Laterality: N/A;   VIDEO BRONCHOSCOPY WITH ENDOBRONCHIAL NAVIGATION N/A 12/09/2018   Procedure: VIDEO BRONCHOSCOPY WITH ENDOBRONCHIAL NAVIGATION;  Surgeon: Hilarie Lovely, MD;  Location: [facility] OR;  Service: Thoracic;  Laterality: N/A;   VIDEO BRONCHOSCOPY WITH INSERTION OF INTERBRONCHIAL VALVE (IBV) N/A 01/06/2019   Procedure: VIDEO BRONCHOSCOPY WITH INSERTION OF INTERBRONCHIAL VALVE (IBV);  Surgeon: Hilarie Lovely, MD;  Location: [facility] OR;  Service: Thoracic;  Laterality: N/A;   VIDEO BRONCHOSCOPY WITH INSERTION OF INTERBRONCHIAL VALVE (IBV) N/A 01/13/2019   Procedure: VIDEO BRONCHOSCOPY WITH INSERTION OF INTERBRONCHIAL VALVE (IBV);  Surgeon: Hilarie Lovely, MD;  Location: [facility] OR;  Service: Thoracic;  Laterality: N/A;   VIDEO BRONCHOSCOPY WITH INSERTION OF INTERBRONCHIAL VALVE (IBV) N/A 02/23/2019   Procedure: VIDEO BRONCHOSCOPY WITH REMOVAL OF INTERBRONCHIAL VALVE (IBV);  Surgeon: Hilarie Lovely, MD;  Location: [facility] OR;  Service: Thoracic;  Laterality: N/A;    REVIEW OF SYSTEMS:  Constitutional: negative Eyes: negative Ears, nose, mouth, throat, and face: negative Respiratory: negative Cardiovascular: negative Gastrointestinal: negative Genitourinary:negative Integument/breast: negative Hematologic/lymphatic: negative Musculoskeletal:negative Neurological: negative Behavioral/Psych: negative Endocrine: negative Allergic/Immunologic: negative   PHYSICAL EXAMINATION: General appearance: alert, cooperative, and no distress Head: Normocephalic, without obvious abnormality,  atraumatic Neck: no adenopathy, no JVD, supple, symmetrical, trachea midline, and thyroid  not enlarged, symmetric, no tenderness/mass/nodules Lymph nodes: Cervical, supraclavicular, and axillary nodes normal. Resp: clear to auscultation bilaterally Back: symmetric, no curvature. ROM normal. No CVA tenderness. Cardio: regular rate and rhythm, S1, S2 normal, no murmur, click, rub or gallop GI: soft, non-tender; bowel sounds normal; no masses,  no organomegaly Extremities: extremities normal, atraumatic, no cyanosis or edema Neurologic: Alert and oriented X 3, normal strength and tone. Normal symmetric reflexes. Normal coordination and gait  ECOG PERFORMANCE STATUS: 0 - Asymptomatic  Blood pressure 136/63, pulse 96, temperature 98.6 F (37 C), temperature source Oral, resp. rate 16, height 5\' 9"  (1.753 m), weight 171 lb 6.4 oz (77.7 kg), SpO2 100%.  LABORATORY DATA: Lab Results  Component Value Date   WBC 7.5 07/18/2023   HGB 13.1 07/18/2023   HCT 38.5 07/18/2023   MCV 91.4 07/18/2023   PLT 188 07/18/2023      Chemistry      Component Value Date/Time   NA 141 07/18/2023 0939   K 3.8 07/18/2023 0939   CL 102 07/18/2023 0939   CO2 29 07/18/2023 0939   BUN 16 07/18/2023 0939   CREATININE 1.27 (H) 07/18/2023 0939   GLU 845 10/28/2009 0000      Component Value Date/Time   CALCIUM  9.5 07/18/2023 0939   ALKPHOS 125 07/18/2023 0939   AST 21 07/18/2023 0939   ALT 20 07/18/2023  6962   BILITOT 0.6 07/18/2023 9528       RADIOGRAPHIC STUDIES: CT Chest W Contrast Result Date: 07/27/2023 CLINICAL DATA:  Non-small-cell lung cancer. Restaging. * Tracking Code: BO * EXAM: CT CHEST WITH CONTRAST TECHNIQUE: Multidetector CT imaging of the chest was performed during intravenous contrast administration. RADIATION DOSE REDUCTION: This exam was performed according to the departmental dose-optimization program which includes automated exposure control, adjustment of the mA and/or kV according to  patient size and/or use of iterative reconstruction technique. CONTRAST:  75mL OMNIPAQUE  IOHEXOL  300 MG/ML  SOLN COMPARISON:  03/25/2023 FINDINGS: Cardiovascular: The heart size is normal. No substantial pericardial effusion. Mild atherosclerotic calcification is noted in the wall of the thoracic aorta. Mediastinum/Nodes: Subcarinal lymphadenopathy is stable to minimally progressive in the interval measuring 12 mm short axis today when measured in a similar fashion to previous exam with 10 mm short axis measurement. Remeasuring on an older study from 01/03/2023, short axis measurement of 12 mm is obtained. There is no hilar lymphadenopathy. The esophagus has normal imaging features. There is no axillary lymphadenopathy. Lungs/Pleura: Centrilobular and paraseptal emphysema evident. Surgical changes with volume loss noted left hemithorax. 3 mm right lower lobe pulmonary nodule on image 115/7 is stable. Tiny left upper lobe nodule on 57/7 is unchanged. No new suspicious pulmonary nodule or mass. No focal airspace consolidation. No pleural effusion. Upper Abdomen: Nodular thickening left adrenal gland is stable. The visualized portion of the upper abdomen is otherwise unremarkable. Musculoskeletal: No worrisome lytic or sclerotic osseous abnormality. IMPRESSION: 1. No substantial interval change. No findings to suggest recurrent or metastatic disease. 2. Borderline enlarged subcarinal lymph node is stable. Continued attention on follow-up recommended. 3. Stable tiny bilateral pulmonary nodules. 4. Stable nodular thickening left adrenal gland. 5. Aortic Atherosclerosis (ICD10-I70.0) and Emphysema (ICD10-J43.9). Electronically Signed   By: Donnal Fusi M.D.   On: 07/27/2023 06:10     ASSESSMENT AND PLAN: This is a very pleasant 78 years old white female with recurrent non-small cell lung cancer that was initially diagnosed as stage Ib (T2 a, N0, M0) adenocarcinoma diagnosed in September 2020 status post left upper  lobectomy with lymph node dissection with disease recurrence in July 2022 presenting with anterior mediastinal lymphadenopathy.  The patient is status post curative radiotherapy to this area under the care of Dr. Jeryl Moris completed December 16, 2020.   She was found to have another evidence of disease recurrence in the AP window lymph nodes in February 2024.  She has brain MRI that showed no evidence of metastatic disease to the brain. She underwent concurrent chemoradiation with weekly carboplatin  for AUC of 2 and paclitaxel  45 Mg/M2.  First dose May 08, 2022.  Status post 7 cycles.  Last dose was given on 06/18/2022 with partial response. The patient tolerated her previous treatment fairly well except for fatigue and odynophagia. She also has mild alopecia. She underwent consolidation treatment with immunotherapy with Imfinzi  1500 Mg IV every 4 weeks.  She started the first dose on Jul 26, 2022.  Status post 13 cycles.   The patient had repeat CT scan of the chest performed recently.  I personally independently reviewed the scan and discussed the result with the patient today.  Her scan showed no concerning findings for disease progression. I recommended her to continue on observation with repeat CT scan The patient voices understanding of current disease status and treatment options and is in agreement with the current care plan.  All questions were answered. The patient knows to  call the clinic with any problems, questions or concerns. We can certainly see the patient much sooner if necessary. The total time spent in the appointment was 30 minutes including review of chart and various tests results, discussions about plan of care and coordination of care plan .   Disclaimer: This note was dictated with voice recognition software. Similar sounding words can inadvertently be transcribed and may not be corrected upon review.

## 2023-08-04 ENCOUNTER — Other Ambulatory Visit: Payer: Self-pay

## 2023-10-22 ENCOUNTER — Inpatient Hospital Stay: Attending: Internal Medicine

## 2023-10-22 ENCOUNTER — Ambulatory Visit (HOSPITAL_COMMUNITY)
Admission: RE | Admit: 2023-10-22 | Discharge: 2023-10-22 | Disposition: A | Discharge status: Home / Self Care | Source: Ambulatory Visit | Attending: Internal Medicine | Admitting: Internal Medicine

## 2023-10-22 DIAGNOSIS — I700 Atherosclerosis of aorta: Secondary | ICD-10-CM | POA: Diagnosis not present

## 2023-10-22 DIAGNOSIS — J439 Emphysema, unspecified: Secondary | ICD-10-CM | POA: Insufficient documentation

## 2023-10-22 DIAGNOSIS — C3490 Malignant neoplasm of unspecified part of unspecified bronchus or lung: Secondary | ICD-10-CM | POA: Insufficient documentation

## 2023-10-22 DIAGNOSIS — R918 Other nonspecific abnormal finding of lung field: Secondary | ICD-10-CM | POA: Insufficient documentation

## 2023-10-22 LAB — CBC WITH DIFFERENTIAL (CANCER CENTER ONLY)
Abs Immature Granulocytes: 0.02 K/uL (ref 0.00–0.07)
Basophils Absolute: 0 K/uL (ref 0.0–0.1)
Basophils Relative: 0 %
Eosinophils Absolute: 0.2 K/uL (ref 0.0–0.5)
Eosinophils Relative: 3 %
HCT: 37.6 % (ref 36.0–46.0)
Hemoglobin: 12.7 g/dL (ref 12.0–15.0)
Immature Granulocytes: 0 %
Lymphocytes Relative: 23 %
Lymphs Abs: 1.5 K/uL (ref 0.7–4.0)
MCH: 31.4 pg (ref 26.0–34.0)
MCHC: 33.8 g/dL (ref 30.0–36.0)
MCV: 93.1 fL (ref 80.0–100.0)
Monocytes Absolute: 0.5 K/uL (ref 0.1–1.0)
Monocytes Relative: 8 %
Neutro Abs: 4.4 K/uL (ref 1.7–7.7)
Neutrophils Relative %: 66 %
Platelet Count: 195 K/uL (ref 150–400)
RBC: 4.04 MIL/uL (ref 3.87–5.11)
RDW: 13.4 % (ref 11.5–15.5)
WBC Count: 6.7 K/uL (ref 4.0–10.5)
nRBC: 0 % (ref 0.0–0.2)

## 2023-10-22 LAB — CMP (CANCER CENTER ONLY)
ALT: 18 U/L (ref 0–44)
AST: 19 U/L (ref 15–41)
Albumin: 4.5 g/dL (ref 3.5–5.0)
Alkaline Phosphatase: 112 U/L (ref 38–126)
Anion gap: 8 (ref 5–15)
BUN: 19 mg/dL (ref 8–23)
CO2: 30 mmol/L (ref 22–32)
Calcium: 9.4 mg/dL (ref 8.9–10.3)
Chloride: 103 mmol/L (ref 98–111)
Creatinine: 1.29 mg/dL — ABNORMAL HIGH (ref 0.44–1.00)
GFR, Estimated: 43 mL/min — ABNORMAL LOW (ref >60)
Glucose, Bld: 104 mg/dL — ABNORMAL HIGH (ref 70–99)
Potassium: 3.7 mmol/L (ref 3.5–5.1)
Sodium: 141 mmol/L (ref 135–145)
Total Bilirubin: 0.6 mg/dL (ref 0.0–1.2)
Total Protein: 6.8 g/dL (ref 6.5–8.1)

## 2023-10-22 MED ORDER — IOHEXOL 300 MG/ML  SOLN
100.0000 mL | Freq: Once | INTRAMUSCULAR | Status: AC | PRN
  Administered 2023-10-22: 75 mL via INTRAVENOUS

## 2023-10-29 ENCOUNTER — Inpatient Hospital Stay: Attending: Internal Medicine | Admitting: Internal Medicine

## 2023-10-29 VITALS — BP 126/75 | HR 73 | Temp 98.1°F | Resp 17 | Ht 69.0 in | Wt 170.0 lb

## 2023-10-29 DIAGNOSIS — Z9221 Personal history of antineoplastic chemotherapy: Secondary | ICD-10-CM | POA: Diagnosis not present

## 2023-10-29 DIAGNOSIS — C3412 Malignant neoplasm of upper lobe, left bronchus or lung: Secondary | ICD-10-CM | POA: Diagnosis present

## 2023-10-29 DIAGNOSIS — C3490 Malignant neoplasm of unspecified part of unspecified bronchus or lung: Secondary | ICD-10-CM | POA: Diagnosis not present

## 2023-10-29 DIAGNOSIS — Z923 Personal history of irradiation: Secondary | ICD-10-CM | POA: Diagnosis not present

## 2023-10-29 DIAGNOSIS — L659 Nonscarring hair loss, unspecified: Secondary | ICD-10-CM | POA: Diagnosis not present

## 2023-10-29 NOTE — Progress Notes (Signed)
 [facility] [phone]   [phone]  OFFICE PROGRESS NOTE  Jennifer Leita Ruth, FNP [address] [place] [address]  DIAGNOSIS: Recurrent non-small cell lung cancer that was initially diagnosed as stage IB (T2 a, N0, M0) non-small cell lung cancer, adenocarcinoma diagnosed in September 2020. She is status post left upper lobectomy with lymph node dissection with tumor size of 3.2 cm and negative lymphadenopathy at that time. She had disease recurrence in July 2022 with anterior mediastinal lymphadenopathy which was consistent with metastatic non-small cell lung cancer.    Biomarker Findings Tumor Mutational Burden - 21 Muts/Mb Microsatellite status - MS-Stable Genomic Findings For a complete list of the genes assayed, please refer to the Appendix. KRAS G12V TP53 F134L 7 Disease relevant genes with no reportable alterations: ALK, BRAF, EGFR, ERBB2, MET, RET, ROS1   PDL1: 0%   PRIOR THERAPY: 1)  Left upper lobectomy under the care of Dr. Shyrl in November 2020. 2) SBRT to the disease recurrence in the anterior mediastinal lymphadenopathy under the care of Dr. Dewey. Last treatment 12/16/20. 3) Concurrent chemoradiation with carboplatin  for an AUC of 2 and Taxol  45 mg/m2. First dose expected on 05/08/2022.  Status post 7 cycles with partial response. 4) Consolidation treatment with immunotherapy with Imfinzi  1500 Mg IV every 4 weeks.  First dose Jul 26, 2022.  Status post 13 cycles .  CURRENT THERAPY: Observation.  INTERVAL HISTORY: Jennifer [name] 78 y.o. [name] returns to the clinic today for follow-up visit. Discussed the use of AI scribe software for clinical note transcription with the patient, who gave verbal consent to proceed.  History of Present Illness Jennifer [name] is a 78 year old [name] with recurrent non-small cell lung cancer who presents for a CT scan of the chest for restaging of her disease.  Initially diagnosed with  stage 1B adenocarcinoma in September 2020 with disease recurrence in 2022, she underwent concurrent chemoradiation with weekly carboplatin  and paclitaxel , followed by one year of consolidation treatment with durvalumab  1500 mg IV every four weeks, completed in May 2025. Since then, she has been under observation.  No new symptoms such as chest pain, breathing issues, nausea, vomiting, or diarrhea. She is planning a trip to [place] for two weeks and intends to participate in a lung cancer 5K survival event upon her return.     MEDICAL HISTORY: Past Medical History:  Diagnosis Date   Cancer (HCC)    Lung cancer   History of chicken pox    Hyperlipidemia    Hypertension    Menopause    Osteoporosis    Pneumonia    walking pneumonia   Pre-diabetes    Vitamin D deficiency     ALLERGIES:  is allergic to aspirin and latex.  MEDICATIONS:  Current Outpatient Medications  Medication Sig Dispense Refill   atorvastatin  (LIPITOR) 40 MG tablet Take 40 mg by mouth every evening.      diltiazem (DILACOR XR) 240 MG 24 hr capsule Take 240 mg by mouth daily.     guaiFENesin  (MUCINEX ) 600 MG 12 hr tablet Take 1 tablet (600 mg total) by mouth 2 (two) times daily. (Patient taking differently: Take 600 mg by mouth daily.)     metoprolol succinate (TOPROL-XL) 50 MG 24 hr tablet Take 50 mg by mouth daily.     Multiple Vitamin (MULTIVITAMIN WITH MINERALS) TABS tablet Take 1 tablet by mouth daily.     triamterene-hydrochlorothiazide (MAXZIDE-25) 37.5-25 MG tablet Take 1 tablet  by mouth daily.     No current facility-administered medications for this visit.    SURGICAL HISTORY:  Past Surgical History:  Procedure Laterality Date   CHEST TUBE INSERTION Left 01/19/2019   Procedure: Chest Tube Insertion;  Surgeon: Shyrl Linnie KIDD, MD;  Location: [facility] OR;  Service: Thoracic;  Laterality: Left;   PLEURADESIS N/A 01/06/2019   Procedure: Pleuradesis - Chemical;  Surgeon: Shyrl Linnie KIDD, MD;   Location: [facility] OR;  Service: Thoracic;  Laterality: N/A;   PLEURADESIS Left 01/19/2019   Procedure: Mechanical Pleuradesis;  Surgeon: Shyrl Linnie KIDD, MD;  Location: [facility] OR;  Service: Thoracic;  Laterality: Left;   VIDEO ASSISTED THORACOSCOPY Left 01/19/2019   Procedure: VIDEO ASSISTED THORACOSCOPY;  Surgeon: Shyrl Linnie KIDD, MD;  Location: [facility] OR;  Service: Thoracic;  Laterality: Left;   VIDEO ASSISTED THORACOSCOPY (VATS)/ LOBECTOMY Left 12/29/2018   Procedure: VIDEO ASSISTED THORACOSCOPY (VATS)/LEFT UPPER  LOBECTOMY with Mediastinal lymph node exploration.;  Surgeon: Shyrl Linnie KIDD, MD;  Location: [facility] OR;  Service: Thoracic;  Laterality: Left;   VIDEO BRONCHOSCOPY N/A 12/29/2018   Procedure: VIDEO BRONCHOSCOPY;  Surgeon: Shyrl Linnie KIDD, MD;  Location: [facility] OR;  Service: Thoracic;  Laterality: N/A;   VIDEO BRONCHOSCOPY WITH ENDOBRONCHIAL NAVIGATION N/A 12/09/2018   Procedure: VIDEO BRONCHOSCOPY WITH ENDOBRONCHIAL NAVIGATION;  Surgeon: Shyrl Linnie KIDD, MD;  Location: [facility] OR;  Service: Thoracic;  Laterality: N/A;   VIDEO BRONCHOSCOPY WITH INSERTION OF INTERBRONCHIAL VALVE (IBV) N/A 01/06/2019   Procedure: VIDEO BRONCHOSCOPY WITH INSERTION OF INTERBRONCHIAL VALVE (IBV);  Surgeon: Shyrl Linnie KIDD, MD;  Location: [facility] OR;  Service: Thoracic;  Laterality: N/A;   VIDEO BRONCHOSCOPY WITH INSERTION OF INTERBRONCHIAL VALVE (IBV) N/A 01/13/2019   Procedure: VIDEO BRONCHOSCOPY WITH INSERTION OF INTERBRONCHIAL VALVE (IBV);  Surgeon: Shyrl Linnie KIDD, MD;  Location: [facility] OR;  Service: Thoracic;  Laterality: N/A;   VIDEO BRONCHOSCOPY WITH INSERTION OF INTERBRONCHIAL VALVE (IBV) N/A 02/23/2019   Procedure: VIDEO BRONCHOSCOPY WITH REMOVAL OF INTERBRONCHIAL VALVE (IBV);  Surgeon: Shyrl Linnie KIDD, MD;  Location: [facility] OR;  Service: Thoracic;  Laterality: N/A;    REVIEW OF SYSTEMS:  A comprehensive review of systems was negative.   PHYSICAL EXAMINATION: General appearance: alert,  cooperative, and no distress Head: Normocephalic, without obvious abnormality, atraumatic Neck: no adenopathy, no JVD, supple, symmetrical, trachea midline, and thyroid  not enlarged, symmetric, no tenderness/mass/nodules Lymph nodes: Cervical, supraclavicular, and axillary nodes normal. Resp: clear to auscultation bilaterally Back: symmetric, no curvature. ROM normal. No CVA tenderness. Cardio: regular rate and rhythm, S1, S2 normal, no murmur, click, rub or gallop GI: soft, non-tender; bowel sounds normal; no masses,  no organomegaly Extremities: extremities normal, atraumatic, no cyanosis or edema  ECOG PERFORMANCE STATUS: 0 - Asymptomatic  Blood pressure 126/75, pulse 73, temperature 98.1 F (36.7 C), temperature source Temporal, resp. rate 17, height 5' 9 (1.753 m), weight 170 lb (77.1 kg), SpO2 99%.  LABORATORY DATA: Lab Results  Component Value Date   WBC 6.7 10/22/2023   HGB 12.7 10/22/2023   HCT 37.6 10/22/2023   MCV 93.1 10/22/2023   PLT 195 10/22/2023      Chemistry      Component Value Date/Time   NA 141 10/22/2023 0927   K 3.7 10/22/2023 0927   CL 103 10/22/2023 0927   CO2 30 10/22/2023 0927   BUN 19 10/22/2023 0927   CREATININE 1.29 (H) 10/22/2023 0927   GLU 845 10/28/2009 0000      Component Value Date/Time   CALCIUM  9.4 10/22/2023 0927  ALKPHOS 112 10/22/2023 0927   AST 19 10/22/2023 0927   ALT 18 10/22/2023 0927   BILITOT 0.6 10/22/2023 9072       RADIOGRAPHIC STUDIES: CT Chest W Contrast Result Date: 10/24/2023 CLINICAL DATA:  Non-small-cell lung cancer, follow-up/staging. * Tracking Code: BO * EXAM: CT CHEST WITH CONTRAST TECHNIQUE: Multidetector CT imaging of the chest was performed during intravenous contrast administration. RADIATION DOSE REDUCTION: This exam was performed according to the departmental dose-optimization program which includes automated exposure control, adjustment of the mA and/or kV according to patient size and/or use of  iterative reconstruction technique. CONTRAST:  75mL OMNIPAQUE  IOHEXOL  300 MG/ML  SOLN COMPARISON:  Multiple priors including most recent CT Jul 18, 2023. FINDINGS: Cardiovascular: Aortic atherosclerosis. Normal size heart. No significant pericardial effusion/thickening. Mediastinum/Nodes: Similar leftward deviation of the mediastinum with soft tissue thickening/fluid along the anterior left lateral aspect for instance on image 37/2. Stable subcarinal lymph node measuring 11 mm in short axis on image 66/2 previously 12 mm. Lungs/Pleura: Surgical change with volume loss and scarring in the left hemithorax is similar prior. No new suspicious nodularity along the suture line. Right lower lobe pulmonary nodule measuring 3 mm on image 113/12 is unchanged. Tiny left upper lobe pulmonary nodule on image 52/12 is unchanged. No new suspicious pulmonary nodules or masses. Scattered atelectasis/scarring.  Mild emphysema. Upper Abdomen: Similar nodular thickening of the left adrenal gland. Musculoskeletal: No aggressive lytic or blastic lesion of bone. Multilevel degenerative changes spine. Demineralization of bone. IMPRESSION: 1. Stable surgical change with volume loss and scarring in the left hemithorax. No new suspicious nodularity along the suture line. 2. Stable small bilateral pulmonary nodules. No new suspicious pulmonary nodules or masses. 3. Stable subcarinal lymph node measuring 11 mm in short axis. 4. Similar nodular thickening of the left adrenal gland. Aortic Atherosclerosis (ICD10-I70.0) and Emphysema (ICD10-J43.9). Electronically Signed   By: Reyes Holder M.D.   On: 10/24/2023 17:18     ASSESSMENT AND PLAN: This is a very pleasant 78 years old Jennifer [name] with recurrent non-small cell lung cancer that was initially diagnosed as stage Ib (T2 a, N0, M0) adenocarcinoma diagnosed in September 2020 status post left upper lobectomy with lymph node dissection with disease recurrence in July 2022 presenting with  anterior mediastinal lymphadenopathy.  The patient is status post curative radiotherapy to this area under the care of Dr. Dewey completed December 16, 2020.   She was found to have another evidence of disease recurrence in the AP window lymph nodes in February 2024.  She has brain MRI that showed no evidence of metastatic disease to the brain. She underwent concurrent chemoradiation with weekly carboplatin  for AUC of 2 and paclitaxel  45 Mg/M2.  First dose May 08, 2022.  Status post 7 cycles.  Last dose was given on 06/18/2022 with partial response. The patient tolerated her previous treatment fairly well except for fatigue and odynophagia. She also has mild alopecia. She underwent consolidation treatment with immunotherapy with Imfinzi  1500 Mg IV every 4 weeks.  She started the first dose on Jul 26, 2022.  Status post 13 cycles.   The patient is currently on observation and she is feeling fine with no concerning complaints. She had repeat CT scan of the chest performed recently.  I personally independently reviewed the scan and discussed the results with the patient.  Her scan showed no concerning findings for disease progression. Assessment and Plan Assessment & Plan Recurrent non-small cell lung cancer, post-treatment, under surveillance Recurrent non-small cell lung  cancer, initially diagnosed as stage IB adenocarcinoma in September 2020. She underwent concurrent chemoradiation with carboplatin  and paclitaxel , followed by one year of durvalumab  consolidation therapy, completed in May 2025. Currently under surveillance with no new symptoms such as chest pain, dyspnea, nausea, vomiting, or diarrhea. Recent CT scan shows no evidence of disease progression. - Continue surveillance with regular follow-up appointments. - Schedule next follow-up appointment in four months. The patient was advised to call immediately if she has any concerning symptoms in the interval. The patient voices understanding of  current disease status and treatment options and is in agreement with the current care plan.  All questions were answered. The patient knows to call the clinic with any problems, questions or concerns. We can certainly see the patient much sooner if necessary. The total time spent in the appointment was 20 minutes including review of chart and various tests results, discussions about plan of care and coordination of care plan .   Disclaimer: This note was dictated with voice recognition software. Similar sounding words can inadvertently be transcribed and may not be corrected upon review.

## 2023-10-30 ENCOUNTER — Other Ambulatory Visit: Payer: Self-pay

## 2024-02-25 ENCOUNTER — Ambulatory Visit (HOSPITAL_COMMUNITY)
Admission: RE | Admit: 2024-02-25 | Discharge: 2024-02-25 | Disposition: A | Discharge status: Home / Self Care | Source: Ambulatory Visit | Attending: Internal Medicine | Admitting: Internal Medicine

## 2024-02-25 ENCOUNTER — Inpatient Hospital Stay

## 2024-02-25 DIAGNOSIS — I7123 Aneurysm of the descending thoracic aorta, without rupture: Secondary | ICD-10-CM | POA: Insufficient documentation

## 2024-02-25 DIAGNOSIS — Z902 Acquired absence of lung [part of]: Secondary | ICD-10-CM | POA: Insufficient documentation

## 2024-02-25 DIAGNOSIS — C3490 Malignant neoplasm of unspecified part of unspecified bronchus or lung: Secondary | ICD-10-CM | POA: Insufficient documentation

## 2024-02-25 DIAGNOSIS — J439 Emphysema, unspecified: Secondary | ICD-10-CM | POA: Insufficient documentation

## 2024-02-25 DIAGNOSIS — Z85118 Personal history of other malignant neoplasm of bronchus and lung: Secondary | ICD-10-CM | POA: Diagnosis present

## 2024-02-25 DIAGNOSIS — I700 Atherosclerosis of aorta: Secondary | ICD-10-CM | POA: Insufficient documentation

## 2024-02-25 DIAGNOSIS — M47814 Spondylosis without myelopathy or radiculopathy, thoracic region: Secondary | ICD-10-CM | POA: Insufficient documentation

## 2024-02-25 LAB — CMP (CANCER CENTER ONLY)
ALT: 18 U/L (ref 0–44)
AST: 25 U/L (ref 15–41)
Albumin: 4.7 g/dL (ref 3.5–5.0)
Alkaline Phosphatase: 110 U/L (ref 38–126)
Anion gap: 11 (ref 5–15)
BUN: 14 mg/dL (ref 8–23)
CO2: 29 mmol/L (ref 22–32)
Calcium: 9.7 mg/dL (ref 8.9–10.3)
Chloride: 99 mmol/L (ref 98–111)
Creatinine: 1.21 mg/dL — ABNORMAL HIGH (ref 0.44–1.00)
GFR, Estimated: 46 mL/min — ABNORMAL LOW
Glucose, Bld: 110 mg/dL — ABNORMAL HIGH (ref 70–99)
Potassium: 3.6 mmol/L (ref 3.5–5.1)
Sodium: 139 mmol/L (ref 135–145)
Total Bilirubin: 0.6 mg/dL (ref 0.0–1.2)
Total Protein: 7.5 g/dL (ref 6.5–8.1)

## 2024-02-25 LAB — CBC WITH DIFFERENTIAL (CANCER CENTER ONLY)
Abs Immature Granulocytes: 0.03 K/uL (ref 0.00–0.07)
Basophils Absolute: 0 K/uL (ref 0.0–0.1)
Basophils Relative: 1 %
Eosinophils Absolute: 0.1 K/uL (ref 0.0–0.5)
Eosinophils Relative: 1 %
HCT: 37.5 % (ref 36.0–46.0)
Hemoglobin: 13 g/dL (ref 12.0–15.0)
Immature Granulocytes: 0 %
Lymphocytes Relative: 24 %
Lymphs Abs: 1.8 K/uL (ref 0.7–4.0)
MCH: 31.9 pg (ref 26.0–34.0)
MCHC: 34.7 g/dL (ref 30.0–36.0)
MCV: 92.1 fL (ref 80.0–100.0)
Monocytes Absolute: 0.6 K/uL (ref 0.1–1.0)
Monocytes Relative: 7 %
Neutro Abs: 4.9 K/uL (ref 1.7–7.7)
Neutrophils Relative %: 67 %
Platelet Count: 215 K/uL (ref 150–400)
RBC: 4.07 MIL/uL (ref 3.87–5.11)
RDW: 12.5 % (ref 11.5–15.5)
WBC Count: 7.4 K/uL (ref 4.0–10.5)
nRBC: 0 % (ref 0.0–0.2)

## 2024-02-25 MED ORDER — IOHEXOL 300 MG/ML  SOLN
60.0000 mL | Freq: Once | INTRAMUSCULAR | Status: AC | PRN
  Administered 2024-02-25: 60 mL via INTRAVENOUS

## 2024-03-03 ENCOUNTER — Inpatient Hospital Stay: Attending: Internal Medicine | Admitting: Internal Medicine

## 2024-03-03 VITALS — BP 122/79 | HR 91 | Temp 97.6°F | Resp 17 | Ht 69.0 in | Wt 170.4 lb

## 2024-03-03 DIAGNOSIS — Z9221 Personal history of antineoplastic chemotherapy: Secondary | ICD-10-CM | POA: Diagnosis not present

## 2024-03-03 DIAGNOSIS — C3490 Malignant neoplasm of unspecified part of unspecified bronchus or lung: Secondary | ICD-10-CM

## 2024-03-03 DIAGNOSIS — Z902 Acquired absence of lung [part of]: Secondary | ICD-10-CM | POA: Diagnosis not present

## 2024-03-03 DIAGNOSIS — Y842 Radiological procedure and radiotherapy as the cause of abnormal reaction of the patient, or of later complication, without mention of misadventure at the time of the procedure: Secondary | ICD-10-CM | POA: Insufficient documentation

## 2024-03-03 DIAGNOSIS — J701 Chronic and other pulmonary manifestations due to radiation: Secondary | ICD-10-CM | POA: Insufficient documentation

## 2024-03-03 DIAGNOSIS — C3412 Malignant neoplasm of upper lobe, left bronchus or lung: Secondary | ICD-10-CM | POA: Insufficient documentation

## 2024-03-03 DIAGNOSIS — L659 Nonscarring hair loss, unspecified: Secondary | ICD-10-CM | POA: Diagnosis not present

## 2024-03-03 DIAGNOSIS — Z923 Personal history of irradiation: Secondary | ICD-10-CM | POA: Insufficient documentation

## 2024-03-03 NOTE — Progress Notes (Signed)
 "     [facility] Telephone:([phone]   Fax:([phone]) [phone]  OFFICE PROGRESS NOTE  Gwenn Hila, NP [address] [place] [address]  DIAGNOSIS: Recurrent non-small cell lung cancer that was initially diagnosed as stage IB (T2 a, N0, M0) non-small cell lung cancer, adenocarcinoma diagnosed in September 2020. She is status post left upper lobectomy with lymph node dissection with tumor size of 3.2 cm and negative lymphadenopathy at that time. She had disease recurrence in July 2022 with anterior mediastinal lymphadenopathy which was consistent with metastatic non-small cell lung cancer.    Biomarker Findings Tumor Mutational Burden - 21 Muts/Mb Microsatellite status - MS-Stable Genomic Findings For a complete list of the genes assayed, please refer to the Appendix. KRAS G12V TP53 F134L 7 Disease relevant genes with no reportable alterations: ALK, BRAF, EGFR, ERBB2, MET, RET, ROS1   PDL1: 0%   PRIOR THERAPY: 1)  Left upper lobectomy under the care of Dr. Shyrl in November 2020. 2) SBRT to the disease recurrence in the anterior mediastinal lymphadenopathy under the care of Dr. Dewey. Last treatment 12/16/20. 3) Concurrent chemoradiation with carboplatin  for an AUC of 2 and Taxol  45 mg/m2. First dose expected on 05/08/2022.  Status post 7 cycles with partial response. 4) Consolidation treatment with immunotherapy with Imfinzi  1500 Mg IV every 4 weeks.  First dose Jul 26, 2022.  Status post 13 cycles .  CURRENT THERAPY: Observation.  INTERVAL HISTORY: Jennifer [name] 79 y.o. female returns to the clinic today for follow-up visit. Discussed the use of AI scribe software for clinical note transcription with the patient, who gave verbal consent to proceed.  History of Present Illness Jennifer [name] is a 79 year old female with recurrent non-small cell lung cancer who presents for routine follow-up and restaging chest CT.  She was initially diagnosed with stage IB  non-small cell lung cancer in September 2020 and underwent concurrent chemoradiation followed by one year of consolidation durvalumab , completed in May 2025. Disease recurrence was identified in July 2022, and she has since been managed with surveillance and periodic imaging.  At this visit, she presents for evaluation and review of a repeat chest CT scan. She reports no new symptoms and feels well overall. She denies chest pain, dyspnea, nausea, vomiting, headache, weight loss, fatigue, fevers, chills, and night sweats.    MEDICAL HISTORY: Past Medical History:  Diagnosis Date   Cancer (HCC)    Lung cancer   History of chicken pox    Hyperlipidemia    Hypertension    Menopause    Osteoporosis    Pneumonia    walking pneumonia   Pre-diabetes    Vitamin D deficiency     ALLERGIES:  is allergic to aspirin and latex.  MEDICATIONS:  Current Outpatient Medications  Medication Sig Dispense Refill   fenofibrate 54 MG tablet      atorvastatin  (LIPITOR) 40 MG tablet Take 40 mg by mouth every evening.      diltiazem (DILACOR XR) 240 MG 24 hr capsule Take 240 mg by mouth daily.     guaiFENesin  (MUCINEX ) 600 MG 12 hr tablet Take 1 tablet (600 mg total) by mouth 2 (two) times daily. (Patient taking differently: Take 600 mg by mouth daily.)     metoprolol succinate (TOPROL-XL) 50 MG 24 hr tablet Take 50 mg by mouth daily.     Multiple Vitamin (MULTIVITAMIN WITH MINERALS) TABS tablet Take 1 tablet by mouth daily.     triamterene-hydrochlorothiazide (MAXZIDE-25) 37.5-25 MG tablet Take  1 tablet by mouth daily.     No current facility-administered medications for this visit.    SURGICAL HISTORY:  Past Surgical History:  Procedure Laterality Date   CHEST TUBE INSERTION Left 01/19/2019   Procedure: Chest Tube Insertion;  Surgeon: Shyrl Linnie KIDD, MD;  Location: [facility] OR;  Service: Thoracic;  Laterality: Left;   PLEURADESIS N/A 01/06/2019   Procedure: Pleuradesis - Chemical;  Surgeon:  Shyrl Linnie KIDD, MD;  Location: [facility] OR;  Service: Thoracic;  Laterality: N/A;   PLEURADESIS Left 01/19/2019   Procedure: Mechanical Pleuradesis;  Surgeon: Shyrl Linnie KIDD, MD;  Location: [facility] OR;  Service: Thoracic;  Laterality: Left;   VIDEO ASSISTED THORACOSCOPY Left 01/19/2019   Procedure: VIDEO ASSISTED THORACOSCOPY;  Surgeon: Shyrl Linnie KIDD, MD;  Location: [facility] OR;  Service: Thoracic;  Laterality: Left;   VIDEO ASSISTED THORACOSCOPY (VATS)/ LOBECTOMY Left 12/29/2018   Procedure: VIDEO ASSISTED THORACOSCOPY (VATS)/LEFT UPPER  LOBECTOMY with Mediastinal lymph node exploration.;  Surgeon: Shyrl Linnie KIDD, MD;  Location: [facility] OR;  Service: Thoracic;  Laterality: Left;   VIDEO BRONCHOSCOPY N/A 12/29/2018   Procedure: VIDEO BRONCHOSCOPY;  Surgeon: Shyrl Linnie KIDD, MD;  Location: [facility] OR;  Service: Thoracic;  Laterality: N/A;   VIDEO BRONCHOSCOPY WITH ENDOBRONCHIAL NAVIGATION N/A 12/09/2018   Procedure: VIDEO BRONCHOSCOPY WITH ENDOBRONCHIAL NAVIGATION;  Surgeon: Shyrl Linnie KIDD, MD;  Location: [facility] OR;  Service: Thoracic;  Laterality: N/A;   VIDEO BRONCHOSCOPY WITH INSERTION OF INTERBRONCHIAL VALVE (IBV) N/A 01/06/2019   Procedure: VIDEO BRONCHOSCOPY WITH INSERTION OF INTERBRONCHIAL VALVE (IBV);  Surgeon: Shyrl Linnie KIDD, MD;  Location: [facility] OR;  Service: Thoracic;  Laterality: N/A;   VIDEO BRONCHOSCOPY WITH INSERTION OF INTERBRONCHIAL VALVE (IBV) N/A 01/13/2019   Procedure: VIDEO BRONCHOSCOPY WITH INSERTION OF INTERBRONCHIAL VALVE (IBV);  Surgeon: Shyrl Linnie KIDD, MD;  Location: [facility] OR;  Service: Thoracic;  Laterality: N/A;   VIDEO BRONCHOSCOPY WITH INSERTION OF INTERBRONCHIAL VALVE (IBV) N/A 02/23/2019   Procedure: VIDEO BRONCHOSCOPY WITH REMOVAL OF INTERBRONCHIAL VALVE (IBV);  Surgeon: Shyrl Linnie KIDD, MD;  Location: [facility] OR;  Service: Thoracic;  Laterality: N/A;    REVIEW OF SYSTEMS:  A comprehensive review of systems was negative.   PHYSICAL EXAMINATION: General  appearance: alert, cooperative, and no distress Head: Normocephalic, without obvious abnormality, atraumatic Neck: no adenopathy, no JVD, supple, symmetrical, trachea midline, and thyroid  not enlarged, symmetric, no tenderness/mass/nodules Lymph nodes: Cervical, supraclavicular, and axillary nodes normal. Resp: clear to auscultation bilaterally Back: symmetric, no curvature. ROM normal. No CVA tenderness. Cardio: regular rate and rhythm, S1, S2 normal, no murmur, click, rub or gallop GI: soft, non-tender; bowel sounds normal; no masses,  no organomegaly Extremities: extremities normal, atraumatic, no cyanosis or edema  ECOG PERFORMANCE STATUS: 0 - Asymptomatic  Blood pressure 122/79, pulse 91, temperature 97.6 F (36.4 C), temperature source Temporal, resp. rate 17, height 5' 9 (1.753 m), weight 170 lb 6.4 oz (77.3 kg), SpO2 98%.  LABORATORY DATA: Lab Results  Component Value Date   WBC 7.4 02/25/2024   HGB 13.0 02/25/2024   HCT 37.5 02/25/2024   MCV 92.1 02/25/2024   PLT 215 02/25/2024      Chemistry      Component Value Date/Time   NA 139 02/25/2024 1135   K 3.6 02/25/2024 1135   CL 99 02/25/2024 1135   CO2 29 02/25/2024 1135   BUN 14 02/25/2024 1135   CREATININE 1.21 (H) 02/25/2024 1135   GLU 845 10/28/2009 0000      Component Value Date/Time  CALCIUM  9.7 02/25/2024 1135   ALKPHOS 110 02/25/2024 1135   AST 25 02/25/2024 1135   ALT 18 02/25/2024 1135   BILITOT 0.6 02/25/2024 1135       RADIOGRAPHIC STUDIES: CT Chest W Contrast Result Date: 03/02/2024 EXAM: CT CHEST WITH CONTRAST 02/25/2024 01:15:52 PM TECHNIQUE: CT of the chest was performed with the administration of 60 mL of iohexol  (OMNIPAQUE ) 300 MG/ML solution. Multiplanar reformatted images are provided for review. Automated exposure control, iterative reconstruction, and/or weight based adjustment of the mA/kV was utilized to reduce the radiation dose to as low as reasonably achievable. COMPARISON: None  available. CLINICAL HISTORY: Non-small cell lung cancer (NSCLC), staging. * Tracking Code: BO * FINDINGS: MEDIASTINUM: Heart and pericardium are unremarkable. The central airways are clear. Stable 1.1 cm in short axis subcarinal lymph node. 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 Atheromatous vascular calcifications in the thoracic aorta and branch vessels. Broad saccular outpouching of the descending thoracic aorta, about 2.3 cm in length, and with local caliber increase in the region of outpouching from background level of 2.1 cm up to 2.8 cm (image 108 series 2). 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 Surveillance in the context of the patient's anticipation follow up oncology imaging is recommended. LYMPH NODES: Stable 1.1 cm in short axis subcarinal lymph node. 5 cm stable small subpleural lymph nodes along the right major fissure. No hilar or axillary lymphadenopathy. LUNGS AND PLEURA: Postoperative findings from left upper lobectomy. Emphysema. 2 x 3 mm right lower lobe nodule, image 124 series 7. No compelling findings of active malignancy. No focal consolidation or pulmonary edema. No pleural effusion or pneumothorax. SOFT TISSUES/BONES: Thoracic spondylosis. Posterior interbody spurring at T10-T11. No acute abnormality of the bones or soft tissues. UPPER ABDOMEN: Stable low density lobularity of the left adrenal gland without a discrete mass. Limited images of the upper abdomen demonstrates no acute abnormality. IMPRESSION: 1. No compelling findings of active malignancy. 2. 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 3. Stable 1.1 cm short axis subcarinal lymph node. 4. Stable low density lobularity of the left adrenal gland without a  discrete mass. 5. Emphysema. 6. Thoracic spondylosis with posterior interbody spurring at T10-T11. 7. Postoperative changes from left upper lobectomy. 8. 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 9. Atheromatous vascular calcifications in the thoracic aorta and branch vessels. Electronically signed by: Ryan Salvage MD 03/02/2024 05:28 PM EST RP Workstation: [id number]     ASSESSMENT AND PLAN: This is a very pleasant 79 years old white female with recurrent non-small cell lung cancer that was initially diagnosed as stage Ib (T2 a, N0, M0) adenocarcinoma diagnosed in September 2020 status post left upper lobectomy with lymph node dissection with disease recurrence in July 2022 presenting with anterior mediastinal lymphadenopathy.  The patient is status post curative radiotherapy to this area under the care of Dr. Dewey completed December 16, 2020.   She was found to have another evidence of disease recurrence in the AP window lymph nodes in February 2024.  She has brain MRI that showed no evidence of metastatic disease to the brain. She underwent concurrent chemoradiation with weekly carboplatin  for AUC of 2 and paclitaxel  45 Mg/M2.  First dose May 08, 2022.  Status post 7 cycles.  Last dose  was given on 06/18/2022 with partial response. The patient tolerated her previous treatment fairly well except for fatigue and odynophagia. She also has mild alopecia. She underwent consolidation treatment with immunotherapy with Imfinzi  1500 Mg IV every 4 weeks.  She started the first dose on Jul 26, 2022.  Status post 13 cycles.   The patient is currently on observation and she is feeling fine with no concerning complaints. She had repeat CT scan of the chest performed recently.  I personally independently reviewed the scan and discussed the result with the patient today.  Her scan showed no concerning findings for disease progression. Assessment and  Plan Assessment & Plan Recurrent non-small cell lung cancer post-chemoradiation and immunotherapy Her disease remains well-controlled with stable findings on recent chest CT, showing no evidence of progression or new lesions. She is asymptomatic. - Reviewed chest CT scan demonstrating stable disease without progression. - Scheduled follow-up visit in four months.  Radiation-induced pulmonary fibrosis She has residual pulmonary fibrosis secondary to prior radiation therapy, currently asymptomatic with no respiratory complaints and stable imaging findings. - Reassured regarding stable imaging and absence of respiratory symptoms. She was advised to call immediately if she has any other concerning symptoms in the interval.  The patient voices understanding of current disease status and treatment options and is in agreement with the current care plan.  All questions were answered. The patient knows to call the clinic with any problems, questions or concerns. We can certainly see the patient much sooner if necessary. The total time spent in the appointment was 20 minutes including review of chart and various tests results, discussions about plan of care and coordination of care plan .   Disclaimer: This note was dictated with voice recognition software. Similar sounding words can inadvertently be transcribed and may not be corrected upon review.        "

## 2024-03-06 ENCOUNTER — Other Ambulatory Visit: Payer: Self-pay

## 2024-06-24 ENCOUNTER — Inpatient Hospital Stay

## 2024-07-01 ENCOUNTER — Inpatient Hospital Stay: Admitting: Internal Medicine
